# Patient Record
Sex: Female | Born: 1955 | Race: Black or African American | Hispanic: No | Marital: Married | Smoking: Current every day smoker
Health system: Southern US, Community
[De-identification: ages and names within clinical notes are randomized; demographics above are authoritative.]

## PROBLEM LIST (undated history)

## (undated) DIAGNOSIS — Z87898 Personal history of other specified conditions: Secondary | ICD-10-CM

## (undated) DIAGNOSIS — E0590 Thyrotoxicosis, unspecified without thyrotoxic crisis or storm: Secondary | ICD-10-CM

## (undated) DIAGNOSIS — E119 Type 2 diabetes mellitus without complications: Secondary | ICD-10-CM

## (undated) DIAGNOSIS — F329 Major depressive disorder, single episode, unspecified: Secondary | ICD-10-CM

## (undated) DIAGNOSIS — D649 Anemia, unspecified: Secondary | ICD-10-CM

## (undated) DIAGNOSIS — Z8759 Personal history of other complications of pregnancy, childbirth and the puerperium: Secondary | ICD-10-CM

## (undated) DIAGNOSIS — IMO0001 Reserved for inherently not codable concepts without codable children: Secondary | ICD-10-CM

## (undated) DIAGNOSIS — Z5189 Encounter for other specified aftercare: Secondary | ICD-10-CM

## (undated) DIAGNOSIS — M1990 Unspecified osteoarthritis, unspecified site: Secondary | ICD-10-CM

## (undated) DIAGNOSIS — E7800 Pure hypercholesterolemia, unspecified: Secondary | ICD-10-CM

## (undated) DIAGNOSIS — I10 Essential (primary) hypertension: Secondary | ICD-10-CM

## (undated) DIAGNOSIS — I2699 Other pulmonary embolism without acute cor pulmonale: Secondary | ICD-10-CM

## (undated) HISTORY — PX: CERVICAL CERCLAGE: SHX1329

## (undated) HISTORY — PX: BREAST CYST EXCISION: SHX579

## (undated) HISTORY — PX: REDUCTION MAMMAPLASTY: SUR839

## (undated) HISTORY — DX: Essential (primary) hypertension: I10

## (undated) HISTORY — DX: Type 2 diabetes mellitus without complications: E11.9

## (undated) HISTORY — DX: Major depressive disorder, single episode, unspecified: F32.9

## (undated) HISTORY — PX: BREAST REDUCTION SURGERY: SHX8

## (undated) HISTORY — PX: JOINT REPLACEMENT: SHX530

## (undated) HISTORY — DX: Pure hypercholesterolemia, unspecified: E78.00

---

## 1992-09-04 DIAGNOSIS — Z87898 Personal history of other specified conditions: Secondary | ICD-10-CM

## 1992-09-04 HISTORY — DX: Personal history of other specified conditions: Z87.898

## 1997-10-31 ENCOUNTER — Other Ambulatory Visit: Admission: RE | Admit: 1997-10-31 | Discharge: 1997-10-31 | Payer: Self-pay | Admitting: Obstetrics and Gynecology

## 1998-11-04 ENCOUNTER — Other Ambulatory Visit: Admission: RE | Admit: 1998-11-04 | Discharge: 1998-11-04 | Payer: Self-pay | Admitting: Obstetrics and Gynecology

## 1999-01-20 ENCOUNTER — Encounter (INDEPENDENT_AMBULATORY_CARE_PROVIDER_SITE_OTHER): Payer: Self-pay | Admitting: Specialist

## 1999-01-21 ENCOUNTER — Inpatient Hospital Stay (HOSPITAL_COMMUNITY): Admission: AD | Admit: 1999-01-21 | Discharge: 1999-01-23 | Payer: Self-pay | Admitting: Obstetrics and Gynecology

## 1999-11-13 ENCOUNTER — Other Ambulatory Visit: Admission: RE | Admit: 1999-11-13 | Discharge: 1999-11-13 | Payer: Self-pay | Admitting: *Deleted

## 2000-03-03 ENCOUNTER — Encounter: Admission: RE | Admit: 2000-03-03 | Discharge: 2000-03-03 | Payer: Self-pay | Admitting: Family Medicine

## 2000-03-03 ENCOUNTER — Encounter: Payer: Self-pay | Admitting: Family Medicine

## 2001-02-07 ENCOUNTER — Other Ambulatory Visit: Admission: RE | Admit: 2001-02-07 | Discharge: 2001-02-07 | Payer: Self-pay | Admitting: *Deleted

## 2002-02-02 ENCOUNTER — Other Ambulatory Visit: Admission: RE | Admit: 2002-02-02 | Discharge: 2002-02-02 | Payer: Self-pay | Admitting: Family Medicine

## 2002-09-12 ENCOUNTER — Encounter: Payer: Self-pay | Admitting: Plastic Surgery

## 2002-09-12 ENCOUNTER — Ambulatory Visit (HOSPITAL_COMMUNITY): Admission: RE | Admit: 2002-09-12 | Discharge: 2002-09-12 | Payer: Self-pay | Admitting: Plastic Surgery

## 2003-07-11 ENCOUNTER — Other Ambulatory Visit: Admission: RE | Admit: 2003-07-11 | Discharge: 2003-07-11 | Payer: Self-pay | Admitting: Family Medicine

## 2003-08-23 ENCOUNTER — Encounter: Admission: RE | Admit: 2003-08-23 | Discharge: 2003-08-23 | Payer: Self-pay | Admitting: Family Medicine

## 2005-01-27 ENCOUNTER — Other Ambulatory Visit: Admission: RE | Admit: 2005-01-27 | Discharge: 2005-01-27 | Payer: Self-pay | Admitting: Family Medicine

## 2006-07-06 LAB — CONVERTED CEMR LAB

## 2006-07-20 ENCOUNTER — Other Ambulatory Visit: Admission: RE | Admit: 2006-07-20 | Discharge: 2006-07-20 | Payer: Self-pay | Admitting: Family Medicine

## 2006-07-30 ENCOUNTER — Emergency Department (HOSPITAL_COMMUNITY): Admission: EM | Admit: 2006-07-30 | Discharge: 2006-07-31 | Payer: Self-pay | Admitting: Emergency Medicine

## 2008-06-21 ENCOUNTER — Encounter: Admission: RE | Admit: 2008-06-21 | Discharge: 2008-06-21 | Payer: Self-pay | Admitting: Family Medicine

## 2008-07-05 LAB — HM MAMMOGRAPHY

## 2008-07-18 ENCOUNTER — Observation Stay (HOSPITAL_COMMUNITY): Admission: EM | Admit: 2008-07-18 | Discharge: 2008-07-20 | Payer: Self-pay | Admitting: Emergency Medicine

## 2008-08-01 ENCOUNTER — Encounter (INDEPENDENT_AMBULATORY_CARE_PROVIDER_SITE_OTHER): Payer: Self-pay | Admitting: Surgery

## 2008-08-01 ENCOUNTER — Ambulatory Visit (HOSPITAL_BASED_OUTPATIENT_CLINIC_OR_DEPARTMENT_OTHER): Admission: RE | Admit: 2008-08-01 | Discharge: 2008-08-01 | Payer: Self-pay | Admitting: Surgery

## 2008-11-14 ENCOUNTER — Encounter: Payer: Self-pay | Admitting: Endocrinology

## 2009-01-24 ENCOUNTER — Ambulatory Visit: Payer: Self-pay | Admitting: Endocrinology

## 2009-01-24 DIAGNOSIS — I10 Essential (primary) hypertension: Secondary | ICD-10-CM | POA: Insufficient documentation

## 2009-01-24 DIAGNOSIS — R079 Chest pain, unspecified: Secondary | ICD-10-CM | POA: Insufficient documentation

## 2009-01-24 DIAGNOSIS — E119 Type 2 diabetes mellitus without complications: Secondary | ICD-10-CM | POA: Insufficient documentation

## 2009-01-24 DIAGNOSIS — F17200 Nicotine dependence, unspecified, uncomplicated: Secondary | ICD-10-CM | POA: Insufficient documentation

## 2009-01-24 DIAGNOSIS — F3289 Other specified depressive episodes: Secondary | ICD-10-CM

## 2009-01-24 DIAGNOSIS — E7800 Pure hypercholesterolemia, unspecified: Secondary | ICD-10-CM | POA: Insufficient documentation

## 2009-01-24 DIAGNOSIS — F329 Major depressive disorder, single episode, unspecified: Secondary | ICD-10-CM

## 2009-01-24 HISTORY — DX: Other specified depressive episodes: F32.89

## 2009-01-24 HISTORY — DX: Type 2 diabetes mellitus without complications: E11.9

## 2009-01-24 HISTORY — DX: Pure hypercholesterolemia, unspecified: E78.00

## 2009-01-24 HISTORY — DX: Major depressive disorder, single episode, unspecified: F32.9

## 2009-01-24 HISTORY — DX: Essential (primary) hypertension: I10

## 2009-02-04 LAB — CONVERTED CEMR LAB

## 2009-02-06 ENCOUNTER — Ambulatory Visit: Payer: Self-pay | Admitting: Endocrinology

## 2009-02-19 ENCOUNTER — Other Ambulatory Visit: Admission: RE | Admit: 2009-02-19 | Discharge: 2009-02-19 | Payer: Self-pay | Admitting: Family Medicine

## 2009-02-20 ENCOUNTER — Encounter: Payer: Self-pay | Admitting: Endocrinology

## 2009-03-20 ENCOUNTER — Ambulatory Visit: Payer: Self-pay | Admitting: Endocrinology

## 2009-04-25 ENCOUNTER — Telehealth: Payer: Self-pay | Admitting: Endocrinology

## 2009-06-19 ENCOUNTER — Ambulatory Visit: Payer: Self-pay | Admitting: Endocrinology

## 2009-06-19 DIAGNOSIS — R209 Unspecified disturbances of skin sensation: Secondary | ICD-10-CM | POA: Insufficient documentation

## 2009-06-19 DIAGNOSIS — IMO0001 Reserved for inherently not codable concepts without codable children: Secondary | ICD-10-CM | POA: Insufficient documentation

## 2009-06-19 LAB — CONVERTED CEMR LAB
Hgb A1c MFr Bld: 8.5 % — ABNORMAL HIGH (ref 4.6–6.5)
Microalb, Ur: 0.6 mg/dL (ref 0.0–1.9)
Sed Rate: 54 mm/hr — ABNORMAL HIGH (ref 0–22)

## 2009-06-20 ENCOUNTER — Encounter: Payer: Self-pay | Admitting: Endocrinology

## 2009-07-01 ENCOUNTER — Encounter: Payer: Self-pay | Admitting: Endocrinology

## 2009-09-24 ENCOUNTER — Ambulatory Visit: Payer: Self-pay | Admitting: Endocrinology

## 2009-12-24 ENCOUNTER — Ambulatory Visit: Payer: Self-pay | Admitting: Endocrinology

## 2009-12-25 ENCOUNTER — Telehealth: Payer: Self-pay | Admitting: Endocrinology

## 2010-03-18 ENCOUNTER — Ambulatory Visit: Payer: Self-pay | Admitting: Endocrinology

## 2010-03-18 LAB — CONVERTED CEMR LAB
ALT: 17 units/L (ref 0–35)
Bilirubin, Direct: 0 mg/dL (ref 0.0–0.3)
Cholesterol: 164 mg/dL (ref 0–200)
HDL: 62.3 mg/dL (ref >39.00)
Hgb A1c MFr Bld: 7.2 % — ABNORMAL HIGH (ref 4.6–6.5)
Total Protein: 7.3 g/dL (ref 6.0–8.3)
VLDL: 25.4 mg/dL (ref 0.0–40.0)

## 2010-05-05 ENCOUNTER — Telehealth: Payer: Self-pay | Admitting: Endocrinology

## 2010-05-06 NOTE — Assessment & Plan Note (Signed)
Summary: 3 MTH FU   STC   Vital Signs:  Patient profile:   55 year old female Height:      66 inches (167.64 cm) Weight:      196.50 pounds (89.32 kg) O2 Sat:      98 % on Room air Temp:     98.6 degrees F (37.00 degrees C) oral Pulse rate:   83 / minute BP sitting:   118 / 82  (left arm) Cuff size:   large  Vitals Entered By: Josph Macho RMA (June 19, 2009 9:47 AM)  O2 Flow:  Room air CC: 3 month follow up/ pt states she is no longer taking Simvastatin/ CF Is Patient Diabetic? Yes   Referring Provider:  griffin  CC:  3 month follow up/ pt states she is no longer taking Simvastatin/ CF.  History of Present Illness: no cbg record, but states cbg's vary from 70-140.   she takes amaryl 1/2 of 2 mg each am. pt states few months of moderate myalgias of the legs, and associated numbness.  she has sxs to a lesser extent on the hands.  Current Medications (verified): 1)  Simvastatin 40 Mg Tabs (Simvastatin) 2)  Victoza 18 Mg/55ml Soln (Liraglutide) .... 1.8 Mg Qd 3)  Bd Pen Needle Short U/f 31g X 8 Mm Misc (Insulin Pen Needle) .Marland Kitchen.. 1 Qd 4)  Benicar 5 Mg Tabs (Olmesartan Medoxomil) .Marland Kitchen.. 1 Qd 5)  Glimepiride 2 Mg Tabs (Glimepiride) .Marland Kitchen.. 1 Qd 6)  Xanax 0.25mg  .... 1 As Needed  Allergies (verified): 1)  ! Codeine Sulfate (Codeine Sulfate) 2)  ! Metformin Hcl  Past History:  Past Medical History: Last updated: 01/24/2009 DEPRESSION (ICD-311) HYPERCHOLESTEROLEMIA (ICD-272.0) HYPERTENSION (ICD-401.9) DM (ICD-250.00)  Review of Systems  The patient denies syncope, weight loss, and weight gain.    Physical Exam  General:  normal appearance.   Pulses:  dorsalis pedis intact bilat.   Extremities:  no deformity.  no ulcer on the feet.  feet are of normal color and temp.  no edema  Neurologic:  sensation is intact to touch on the feet  Additional Exam:  Creatine Kinase           127 U/L                     7-177 Sed Rate             [H]  54 mm/hr   Hemoglobin A1C        [H]  8.5 %        Impression & Recommendations:  Problem # 1:  DM (ICD-250.00) needs increased rx  Problem # 2:  NUMBNESS (ICD-782.0) ? related to #1  Problem # 3:  MYALGIA (ICD-729.1) uncertain etiology  Medications Added to Medication List This Visit: 1)  Xanax 0.25mg   .... 1 as needed 2)  Glimepiride 4 Mg Tabs (Glimepiride) .Marland Kitchen.. 1 each am  Other Orders: Nephrology Referral (Nephro) TLB-A1C / Hgb A1C (Glycohemoglobin) (83036-A1C) TLB-Microalbumin/Creat Ratio, Urine (82043-MALB) TLB-CK Total Only(Creatine Kinase/CPK) (82550-CK) TLB-Sedimentation Rate (ESR) (85652-ESR) Est. Patient Level IV ([address])  Patient Instructions: 1)  check your blood sugar 1 time a day.  vary the time of day when you check, between before the 3 meals, and at bedtime.  also check if you have symptoms of your blood sugar being too high or too low.  please keep a record of the readings and bring it to your next appointment here.  please call us sooner if  you are having low blood sugar episodes. 2)  tests are being ordered for you today.  a few days after the test(s), please call [phone] to hear your test results. 3)  pending the test results, please: 4)  continue victoza 1.8 mg each am. 5)  continue amaryl to 1/2 of 2 mg each am. 6)  Please schedule a follow-up appointment in 3 months. 7)  we'll check nerve and muscle tests of the arms and legs.  you will be called with a day and time for an appointment. 8)  (update: i left message on phone-tree:  increase amaryl to 4 mg each am.  call for cbg below 70). Prescriptions: GLIMEPIRIDE 4 MG TABS (GLIMEPIRIDE) 1 each am  #30 x 11   Entered and Authorized by:   Minus Breeding MD   Signed by:   Minus Breeding MD on 06/19/2009   Method used:   [facility]  [address] [phone][facility] (retail)       [address]       [place], [place]  [address]       Ph: [phone]       Fax: [phone]   RxID:   [id number]

## 2010-05-06 NOTE — Progress Notes (Signed)
Summary: Actos?  Phone Note Call from Patient   Summary of Call: Pt called stating that she is interested in starting Actos to control blood sugar but she does have some question: Is this medciation going to replace on of her cureent meds or is it in addition to? Is ther another alternative medication, possibly Onglyza? Please advise Initial call taken by: Margaret Pyle, CMA,  December 25, 2009 4:18 PM  Follow-up for Phone Call        because you are already on victoza, the addition of onglyza would not help.  i sent rx for actos to pharmacy Follow-up by: Minus Breeding MD,  December 25, 2009 5:36 PM  Additional Follow-up for Phone Call Additional follow up Details #1::        Pt informed Additional Follow-up by: Margaret Pyle, CMA,  December 26, 2009 7:59 AM

## 2010-05-06 NOTE — Progress Notes (Signed)
Summary: Rx request  Phone Note Call from Patient   Caller: Patient [phone] Summary of Call: pt called requesting Rx Victoza #30 x 2 until ROV in March. Rx sent to pt preferred pharmacy Initial call taken by: Margaret Pyle, CMA,  April 25, 2009 3:36 PM    Prescriptions: VICTOZA 18 MG/3ML SOLN (LIRAGLUTIDE) 1.8 mg qd  #30 x 2   Entered by:   Margaret Pyle, CMA   Authorized by:   Minus Breeding MD   Signed by:   Margaret Pyle, CMA on 04/25/2009   Method used:   [facility]  [address] [phone]* (retail)       [address]       [place], [place]  [address]       Ph: [phone]       Fax: [phone]   RxID:   [id number]

## 2010-05-06 NOTE — Miscellaneous (Signed)
Summary: Orders Update  Clinical Lists Changes  Orders: Added new Referral order of Neurology Referral (Neuro) - Signed 

## 2010-05-06 NOTE — Assessment & Plan Note (Signed)
Summary: 3 MTH FU  STC   Vital Signs:  Patient profile:   55 year old female Height:      66 inches (167.64 cm) Weight:      194 pounds (88.18 kg) BMI:     31.43 O2 Sat:      99 % on Room air Temp:     98.3 degrees F (36.83 degrees C) oral Pulse rate:   83 / minute Pulse rhythm:   regular BP sitting:   110 / 84  (left arm) Cuff size:   large  Vitals Entered By: Brenton Grills (September 24, 2009 9:20 AM)  O2 Flow:  Room air CC: 18mo f/u/aj   Referring Provider:  griffin  CC:  18mo f/u/aj.  History of Present Illness: no cbg record, but states cbg's are "140 average."  pt states she feels well in general.  Current Medications (verified): 1)  Simvastatin 40 Mg Tabs (Simvastatin) 2)  Victoza 18 Mg/74ml Soln (Liraglutide) .... 1.8 Mg Qd 3)  Bd Pen Needle Short U/f 31g X 8 Mm Misc (Insulin Pen Needle) .Marland Kitchen.. 1 Qd 4)  Benicar 5 Mg Tabs (Olmesartan Medoxomil) .Marland Kitchen.. 1 Qd 5)  Xanax 0.25mg  .... 1 As Needed 6)  Glimepiride 4 Mg Tabs (Glimepiride) .Marland Kitchen.. 1 Each Am  Allergies (verified): 1)  ! Codeine Sulfate (Codeine Sulfate) 2)  ! Metformin Hcl  Past History:  Past Medical History: Last updated: 01/24/2009 DEPRESSION (ICD-311) HYPERCHOLESTEROLEMIA (ICD-272.0) HYPERTENSION (ICD-401.9) DM (ICD-250.00)  Review of Systems  The patient denies hypoglycemia.    Physical Exam  Neck:  Supple without thyroid enlargement or tenderness.  Pulses:  dorsalis pedis intact bilat.   Extremities:  no deformity.  no ulcer on the feet.  feet are of normal color and temp.  no edema  Neurologic:  sensation is intact to touch on the feet  Additional Exam:  Hemoglobin A1C       [H]  8.8 %      Impression & Recommendations:  Problem # 1:  DM (ICD-250.00) needs increased rx  Medications Added to Medication List This Visit: 1)  Bromocriptine Mesylate 2.5 Mg Tabs (Bromocriptine mesylate) .... 1/2 tab qhs  Other Orders: TLB-A1C / Hgb A1C (Glycohemoglobin) (83036-A1C)  Patient  Instructions: 1)  check your blood sugar 1 time a day.  vary the time of day when you check, between before the 3 meals, and at bedtime.  also check if you have symptoms of your blood sugar being too high or too low.  please keep a record of the readings and bring it to your next appointment here.  please call us sooner if you are having low blood sugar episodes. 2)  tests are being ordered for you today.  a few days after the test(s), please call [phone] to hear your test results. 3)  pending the test results, please: 4)  continue victoza 1.8 mg each am. 5)  Please schedule a follow-up appointment in 3 months. 6)  continue amaryl to 4 mg each am.  7)  if a1c is high, options are to add actos or bromocriptine. 8)  (update: i left message on phone-tree:  add bromocriptine 1/2 of 2.5 mg at bedtime) Prescriptions: BROMOCRIPTINE MESYLATE 2.5 MG TABS (BROMOCRIPTINE MESYLATE) 1/2 tab qhs  #30 x 5   Entered and Authorized by:   Minus Breeding MD   Signed by:   Minus Breeding MD on 09/24/2009   Method used:   [facility]  [address] [id number]       [address]       [place], [place]  [address]       Ph: [phone]       Fax: [phone]   RxID:   [id number]

## 2010-05-06 NOTE — Assessment & Plan Note (Signed)
Summary: 3 MTH FU  STC   Vital Signs:  Patient profile:   55 year old female Height:      66 inches (167.64 cm) Weight:      193 pounds (87.73 kg) O2 Sat:      99 % on Room air Temp:     98.6 degrees F (37.00 degrees C) oral Pulse rate:   83 / minute Pulse rhythm:   regular BP sitting:   122 / 78  (left arm) Cuff size:   large  Vitals Entered By: Brenton Grills MA (December 24, 2009 9:07 AM)  O2 Flow:  Room air CC: 3 month F/U/aj Is Patient Diabetic? Yes   Referring Provider:  griffin  CC:  3 month F/U/aj.  History of Present Illness: no cbg record, but states cbg's are mid-100's.  she had left knee surgery, but now feels better.  she has lost weight, due to her efforts.  Current Medications (verified): 1)  Simvastatin 40 Mg Tabs (Simvastatin) 2)  Victoza 18 Mg/69ml Soln (Liraglutide) .... 1.8 Mg Qd 3)  Bd Pen Needle Short U/f 31g X 8 Mm Misc (Insulin Pen Needle) .Marland Kitchen.. 1 Qd 4)  Benicar 5 Mg Tabs (Olmesartan Medoxomil) .Marland Kitchen.. 1 Qd 5)  Xanax 0.25mg  .... 1 As Needed 6)  Glimepiride 4 Mg Tabs (Glimepiride) .Marland Kitchen.. 1 Each Am 7)  Bromocriptine Mesylate 2.5 Mg Tabs (Bromocriptine Mesylate) .... 1/2 Tab Qhs  Allergies (verified): 1)  ! Codeine Sulfate (Codeine Sulfate) 2)  ! Metformin Hcl  Past History:  Past Medical History: Last updated: 01/24/2009 DEPRESSION (ICD-311) HYPERCHOLESTEROLEMIA (ICD-272.0) HYPERTENSION (ICD-401.9) DM (ICD-250.00)  Review of Systems  The patient denies hypoglycemia.    Physical Exam  General:  normal appearance.   Neck:  Supple without thyroid enlargement or tenderness.  Additional Exam:  Hemoglobin A1C       [H]  8.9 %       Impression & Recommendations:  Problem # 1:  DM (ICD-250.00) needs increased rx  Medications Added to Medication List This Visit: 1)  Actos 45 Mg Tabs (Pioglitazone hcl) .Marland Kitchen.. 1 tab once daily  Other Orders: TLB-A1C / Hgb A1C (Glycohemoglobin) (83036-A1C) Est. Patient Level III ([address])  Patient  Instructions: 1)  check your blood sugar 1 time a day.  vary the time of day when you check, between before the 3 meals, and at bedtime.  also check if you have symptoms of your blood sugar being too high or too low.  please keep a record of the readings and bring it to your next appointment here.  please call us sooner if you are having low blood sugar episodes. 2)  tests are being ordered for you today.  a few days after the test(s), please call [phone] to hear your test results. 3)  pending the test results, please continue the same medications for now. 4)  Please schedule a follow-up appointment in 3 months. 5)  here are a few clarinex pills 5 mg, for allergy symptoms 6)  (update: i left message on phone-tree:  a1c is still high.  please consider actos, and call if you agree.) Prescriptions: ACTOS 45 MG TABS (PIOGLITAZONE HCL) 1 tab once daily  #30 x 11   Entered and Authorized by:   Minus Breeding MD   Signed by:   Minus Breeding MD on 12/25/2009   Method used:   [facility]        [facility]  [address] [phone]* (retail)  [address]       [place], [place]  [address]       Ph: [phone]       Fax: [phone]   RxID:   [id number]

## 2010-05-08 NOTE — Assessment & Plan Note (Signed)
Summary: 3 MTH FU--STC   Vital Signs:  Patient profile:   55 year old female Height:      66 inches (167.64 cm) Weight:      202.50 pounds (92.05 kg) BMI:     32.80 O2 Sat:      99 % on Room air Temp:     98.7 degrees F (37.06 degrees C) oral Pulse rate:   81 / minute Pulse rhythm:   regular BP sitting:   112 / 76  (left arm) Cuff size:   large  Vitals Entered By: Brenton Grills CMA Duncan Dull) (March 18, 2010 9:14 AM)  O2 Flow:  Room air CC: 3 month F/U/aj Is Patient Diabetic? Yes Comments Per pt, pt is due for mammogram, pap, and colonoscopy is due   Referring Provider:  griffin  CC:  3 month F/U/aj.  History of Present Illness: no cbg record, but states cbg's are well-controlled, until she got a dental minfection last week.  then, cbg increased to mid-100's.  prior to the infection, it was low-100's.  she ran out of amaryl 4 days ago, as her pills were only 2 mg.  she c/o weight gain.   pt wants to re-try the chantix  Current Medications (verified): 1)  Simvastatin 40 Mg Tabs (Simvastatin) 2)  Victoza 18 Mg/48ml Soln (Liraglutide) .... 1.8 Mg Qd 3)  Bd Pen Needle Short U/f 31g X 8 Mm Misc (Insulin Pen Needle) .Marland Kitchen.. 1 Qd 4)  Benicar 5 Mg Tabs (Olmesartan Medoxomil) .Marland Kitchen.. 1 Qd 5)  Xanax 0.25mg  .... 1 As Needed 6)  Glimepiride 4 Mg Tabs (Glimepiride) .Marland Kitchen.. 1 Each Am 7)  Bromocriptine Mesylate 2.5 Mg Tabs (Bromocriptine Mesylate) .... 1/2 Tab Qhs 8)  Actos 45 Mg Tabs (Pioglitazone Hcl) .Marland Kitchen.. 1 Tab Once Daily  Allergies (verified): 1)  ! Codeine Sulfate (Codeine Sulfate) 2)  ! Metformin Hcl  Past History:  Past Medical History: Last updated: 01/24/2009 DEPRESSION (ICD-311) HYPERCHOLESTEROLEMIA (ICD-272.0) HYPERTENSION (ICD-401.9) DM (ICD-250.00)  Review of Systems  The patient denies hypoglycemia.    Physical Exam  General:  normal appearance.   Pulses:  dorsalis pedis intact bilat.   Extremities:  no deformity.  no ulcer on the feet.  feet are of normal color  and temp.  no edema  Neurologic:  sensation is intact to touch on the feet.  Additional Exam:  Hemoglobin A1C       [H]  7.2 %   Impression & Recommendations:  Problem # 1:  DM (ICD-250.00) goal is to minimize the sulfonylurea.  Medications Added to Medication List This Visit: 1)  Bromocriptine Mesylate 2.5 Mg Tabs (Bromocriptine mesylate) .... 1/2 tab at bedtime 2)  Bromocriptine Mesylate 2.5 Mg Tabs (Bromocriptine mesylate) .Marland Kitchen.. 1 tab at bedtime 3)  Chantix Starting Month Pak 0.5 Mg X 11 & 1 Mg X 42 Tabs (Varenicline tartrate) .Marland Kitchen.. 1 tab two times a day.  please refill with continuing packs  Other Orders: Diabetic Clinic Referral (Diabetic) TLB-A1C / Hgb A1C (Glycohemoglobin) (83036-A1C) TLB-Lipid Panel (80061-LIPID) TLB-Hepatic/Liver Function Pnl (80076-HEPATIC) Est. Patient Level III ([address])  Patient Instructions: 1)  blood tests are being ordered for you today.  please call [phone] to hear your test results. 2)  pending the test results, please increase bromocriptine to 2.5 mg at bedtime, and take glimepiride 4 mg each am.  3)  Please schedule a follow-up appointment in 3 months. 4)  please make an appointment with dr Valentina Lucks. 5)  (update: i sent rx for chantix) Prescriptions: CHANTIX  STARTING MONTH PAK 0.5 MG X 11 & 1 MG X 42 TABS (VARENICLINE TARTRATE) 1 tab two times a day.  please refill with continuing packs  #1 month x 5   Entered and Authorized by:   Minus Breeding MD   Signed by:   Minus Breeding MD on 03/18/2010   Method used:   [facility]        [facility]  [address] [phone][facility] ([facility]ail)       [address]       [place], [place]  [address]       Ph: [phone]       Fax: [phone]   RxID:   [id number] BROMOCRIPTINE MESYLATE 2.5 MG TABS (BROMOCRIPTINE MESYLATE) 1 tab at bedtime  #30 x 11   Entered and Authorized by:   Minus Breeding MD   Signed by:   Minus Breeding MD on 03/18/2010   Method used:   [facility]ron[facility] to         [facility]  [address] [phone]* (retail)       [address]       [place], [place]  [address]       Ph: [phone]       Fax: [phone]   RxID:   [id number] GLIMEPIRIDE 4 MG TABS (GLIMEPIRIDE) 1 each am  #30 x 11   Entered and Authorized by:   Minus Breeding MD   Signed by:   Minus Breeding MD on 03/18/2010   Method used:   [facility]        [facility]  [address] [phone]* (retail)       [address]       [place], [place]  [address]       Ph: [phone]       Fax: [phone]   RxID:   [id number] VICTOZA 18 MG/3ML SOLN (LIRAGLUTIDE) 1.8 mg qd  #30 days x 11   Entered and Authorized by:   Minus Breeding MD   Signed by:   Minus Breeding MD on 03/18/2010   Method used:   [facility] to        [facility]  [address] [phone]* (retail)       [address]       [place], [place]  [address]       Ph: [phone]       Fax: [phone]   RxID:   [id number]    Orders Added: 1)  Diabetic Clinic Referral [Diabetic] 2)  TLB-A1C / Hgb A1C (Glycohemoglobin) [83036-A1C] 3)  TLB-Lipid Panel [80061-LIPID] 4)  TLB-Hepatic/Liver Function Pnl [80076-HEPATIC] 5)  Est. Patient Level III [[address]]   Immunization History:  Tetanus/Td Immunization History:    Tetanus/Td:  historical (04/07/2003)  Influenza Immunization History:    Influenza:  historical (01/04/2010)   Immunization History:  Tetanus/Td Immunization History:    Tetanus/Td:  Historical (04/07/2003)  Influenza Immunization History:    Influenza:  Historical (01/04/2010)

## 2010-05-14 NOTE — Progress Notes (Signed)
Summary: low CBG  Phone Note Call from Patient Call back at Work Phone [phone]   Caller: Patient [phone] Summary of Call: Pt called stating she had low CBG last night, 48. Pt says she was told that she needed to inform MD if this happened. Initial call taken by: Margaret Pyle, CMA,  May 05, 2010 11:13 AM  Follow-up for Phone Call        reduce glimepiride to 1 mg each am.  i sent rx.  ret as scheduled. Follow-up by: Minus Breeding MD,  May 05, 2010 11:16 AM  Additional Follow-up for Phone Call Additional follow up Details #1::        Pt informed via VM, told to call back if needed Additional Follow-up by: Margaret Pyle, CMA,  May 05, 2010 1:28 PM    New/Updated Medications: GLIMEPIRIDE 1 MG TABS (GLIMEPIRIDE) 1 tab each am Prescriptions: GLIMEPIRIDE 1 MG TABS (GLIMEPIRIDE) 1 tab each am  #30 x 3   Entered and Authorized by:   Minus Breeding MD   Signed by:   Minus Breeding MD on 05/05/2010   Method used:   [facility]        [facility]  [address] [phone]* (retail)       [address]       [place], [place]  [address]       Ph: [phone]       Fax: [phone]   RxID:   [id number]

## 2010-06-18 ENCOUNTER — Ambulatory Visit: Payer: Self-pay | Admitting: Endocrinology

## 2010-07-08 ENCOUNTER — Other Ambulatory Visit: Payer: Self-pay | Admitting: Family Medicine

## 2010-07-08 ENCOUNTER — Other Ambulatory Visit: Payer: Self-pay | Admitting: Endocrinology

## 2010-07-08 DIAGNOSIS — Z1231 Encounter for screening mammogram for malignant neoplasm of breast: Secondary | ICD-10-CM

## 2010-07-16 LAB — DIFFERENTIAL
Basophils Absolute: 0.1 10*3/uL (ref 0.0–0.1)
Basophils Absolute: 0 10*3/uL (ref 0.0–0.1)
Lymphocytes Relative: 31 % (ref 12–46)
Lymphocytes Relative: 31 % (ref 12–46)
Monocytes Absolute: 0.6 10*3/uL (ref 0.1–1.0)
Monocytes Absolute: 0.5 10*3/uL (ref 0.1–1.0)
Monocytes Relative: 6 % (ref 3–12)
Neutro Abs: 7.3 10*3/uL (ref 1.7–7.7)
Neutro Abs: 5.7 10*3/uL (ref 1.7–7.7)
Neutrophils Relative %: 62 % (ref 43–77)
Neutrophils Relative %: 61 % (ref 43–77)

## 2010-07-16 LAB — CARDIAC PANEL(CRET KIN+CKTOT+MB+TROPI)
CK, MB: 1.3 ng/mL (ref 0.3–4.0)
CK, MB: 1.3 ng/mL (ref 0.3–4.0)
CK, MB: 1.6 ng/mL (ref 0.3–4.0)
Relative Index: 1.1 (ref 0.0–2.5)
Relative Index: 1.1 (ref 0.0–2.5)
Relative Index: 1.3 (ref 0.0–2.5)
Total CK: 119 U/L (ref 7–177)
Total CK: 121 U/L (ref 7–177)
Troponin I: <0.01 ng/mL (ref 0.00–0.06)

## 2010-07-16 LAB — TROPONIN I: Troponin I: <0.01 ng/mL (ref 0.00–0.06)

## 2010-07-16 LAB — GLUCOSE, CAPILLARY
Glucose-Capillary: 116 mg/dL — ABNORMAL HIGH (ref 70–99)
Glucose-Capillary: 128 mg/dL — ABNORMAL HIGH (ref 70–99)
Glucose-Capillary: 118 mg/dL — ABNORMAL HIGH (ref 70–99)
Glucose-Capillary: 126 mg/dL — ABNORMAL HIGH (ref 70–99)
Glucose-Capillary: 130 mg/dL — ABNORMAL HIGH (ref 70–99)

## 2010-07-16 LAB — HEMOGLOBIN A1C: Hgb A1c MFr Bld: 7.3 % — ABNORMAL HIGH (ref 4.6–6.1)

## 2010-07-16 LAB — CBC
HCT: 37.2 % (ref 36.0–46.0)
Hemoglobin: 13.4 g/dL (ref 12.0–15.0)
Hemoglobin: 11.4 g/dL — ABNORMAL LOW (ref 12.0–15.0)
Hemoglobin: 11.7 g/dL — ABNORMAL LOW (ref 12.0–15.0)
MCV: 86.4 fL (ref 78.0–100.0)
Platelets: 361 10*3/uL (ref 150–400)
RBC: 3.74 MIL/uL — ABNORMAL LOW (ref 3.87–5.11)
RBC: 3.89 MIL/uL (ref 3.87–5.11)
RBC: 4.31 MIL/uL (ref 3.87–5.11)
RDW: 14.9 % (ref 11.5–15.5)
RDW: 14.4 % (ref 11.5–15.5)
WBC: 9.3 10*3/uL (ref 4.0–10.5)
WBC: 12.9 10*3/uL — ABNORMAL HIGH (ref 4.0–10.5)

## 2010-07-16 LAB — APTT: aPTT: 31 seconds (ref 24–37)

## 2010-07-16 LAB — POCT CARDIAC MARKERS
Myoglobin, poc: 64.3 ng/mL (ref 12–200)
Troponin i, poc: <0.05 ng/mL (ref 0.00–0.09)

## 2010-07-16 LAB — COMPREHENSIVE METABOLIC PANEL
ALT: 31 U/L (ref 0–35)
AST: 33 U/L (ref 0–37)
Alkaline Phosphatase: 81 U/L (ref 39–117)
CO2: 25 mEq/L (ref 19–32)
Glucose, Bld: 124 mg/dL — ABNORMAL HIGH (ref 70–99)
Potassium: 3.6 mEq/L (ref 3.5–5.1)
Sodium: 142 mEq/L (ref 135–145)
Total Protein: 6.2 g/dL (ref 6.0–8.3)

## 2010-07-16 LAB — POCT I-STAT, CHEM 8
BUN: 21 mg/dL (ref 6–23)
Chloride: 108 mEq/L (ref 96–112)
Creatinine, Ser: 0.8 mg/dL (ref 0.4–1.2)
Sodium: 140 mEq/L (ref 135–145)
TCO2: 25 mmol/L (ref 0–100)

## 2010-07-16 LAB — URINALYSIS, ROUTINE W REFLEX MICROSCOPIC
Bilirubin Urine: NEGATIVE
Ketones, ur: NEGATIVE mg/dL
Nitrite: NEGATIVE
Protein, ur: NEGATIVE mg/dL
Urobilinogen, UA: 0.2 mg/dL (ref 0.0–1.0)
pH: 5.5 (ref 5.0–8.0)

## 2010-07-16 LAB — LIPID PANEL
Cholesterol: 183 mg/dL (ref 0–200)
LDL Cholesterol: 87 mg/dL (ref 0–99)
Triglycerides: 249 mg/dL — ABNORMAL HIGH (ref <150)
VLDL: 50 mg/dL — ABNORMAL HIGH (ref 0–40)

## 2010-07-16 LAB — BASIC METABOLIC PANEL
Calcium: 9.5 mg/dL (ref 8.4–10.5)
Calcium: 8.5 mg/dL (ref 8.4–10.5)
Creatinine, Ser: 0.98 mg/dL (ref 0.4–1.2)
GFR calc Af Amer: >60 mL/min (ref >60)
GFR calc non Af Amer: 59 mL/min — ABNORMAL LOW (ref >60)
GFR calc non Af Amer: >60 mL/min (ref >60)
Glucose, Bld: 149 mg/dL — ABNORMAL HIGH (ref 70–99)
Sodium: 138 mEq/L (ref 135–145)
Sodium: 142 mEq/L (ref 135–145)

## 2010-07-16 LAB — CK TOTAL AND CKMB (NOT AT ARMC)
CK, MB: 1.8 ng/mL (ref 0.3–4.0)
Total CK: 162 U/L (ref 7–177)

## 2010-07-29 ENCOUNTER — Ambulatory Visit
Admission: RE | Admit: 2010-07-29 | Discharge: 2010-07-29 | Disposition: A | Discharge status: Home / Self Care | Payer: 59 | Source: Ambulatory Visit | Attending: Family Medicine | Admitting: Family Medicine

## 2010-07-29 DIAGNOSIS — Z1231 Encounter for screening mammogram for malignant neoplasm of breast: Secondary | ICD-10-CM

## 2010-07-31 ENCOUNTER — Other Ambulatory Visit (HOSPITAL_COMMUNITY)
Admission: RE | Admit: 2010-07-31 | Discharge: 2010-07-31 | Disposition: A | Discharge status: Home / Self Care | Payer: 59 | Source: Ambulatory Visit | Attending: Family Medicine | Admitting: Family Medicine

## 2010-07-31 ENCOUNTER — Other Ambulatory Visit: Payer: Self-pay | Admitting: Family Medicine

## 2010-07-31 DIAGNOSIS — Z124 Encounter for screening for malignant neoplasm of cervix: Secondary | ICD-10-CM | POA: Insufficient documentation

## 2010-08-15 ENCOUNTER — Other Ambulatory Visit: Payer: Self-pay | Admitting: Gastroenterology

## 2010-08-19 ENCOUNTER — Encounter: Payer: Self-pay | Admitting: Endocrinology

## 2010-08-19 NOTE — Op Note (Signed)
NAME:  Chelsea Gray, Chelsea Gray NO.:  [id number]   MEDICAL RECORD NO.:  [id number]          PATIENT TYPE:  AMB   LOCATION:  [facility]                         FACILITY:  [facility]   PHYSICIAN:  Thomas A. Cornett, M.D.DATE OF BIRTH:  1956/02/14   DATE OF PROCEDURE:  08/01/2008  DATE OF DISCHARGE:                               OPERATIVE REPORT   PREOPERATIVE DIAGNOSIS:  Right breast mass.   POSTOPERATIVE DIAGNOSIS:  Right breast mass.   PROCEDURE:  Open right excisional breast biopsy.   SURGEON:  Maisie Fus A. Cornett, M.D.   ANESTHESIA:  MAC with 0.25% Sensorcaine with epinephrine local.   ESTIMATED BLOOD LOSS:  5 mL.   SPECIMEN:  Right breast mass to pathology.   INDICATIONS FOR PROCEDURE:  The patient is a 55 year old female with a  right breast mass.  This is in a previous breast reduction scar and is  getting larger.  It is located in the medial aspect of her right breast  and it has had a dark color to it.  I recommended excision for further  diagnosis and for relief of her symptoms.   DESCRIPTION OF PROCEDURE:  The patient was brought to the operating  room.  She was marked preoperatively with the right breast as being  indicated as the proper breast.  Lesion was located on the medial aspect  of a previous breast reduction scar, measured about 1-2 of maximal  diameter.  It was quite superficial.  After induction of anesthesia, she  was prepped and draped in a sterile fashion.  Incision was made directly  over the mass measuring about 2 cm.  The mass came out easily and  appeared to be a cystic mass consistent with a cyst.  The wound was  closed with a deep layer of 3-0 Vicryl and subsequently 4-0 Monocryl  subcuticular stitch.  Dermabond was applied.  All final counts of  sponge, needle and instruments were found to be correct.  She was  awakened and taken to recovery in satisfactory condition.      Thomas A. Cornett, M.D.  Electronically Signed    TAC/MEDQ  D:   08/01/2008  T:  08/01/2008  Job:  [id number]   cc:   Beckie Salts, M.D.   Gretta Arab Valentina Lucks, M.D.  Fax: [phone]

## 2010-08-19 NOTE — Discharge Summary (Signed)
NAMEMarland Kitchen  Chelsea, Gray NO.:  [id number]   MEDICAL RECORD NO.:  [id number]          PATIENT TYPE:  OBV   LOCATION:  5504                         FACILITY:  [facility]   PHYSICIAN:  Ramiro Harvest, MD    DATE OF BIRTH:  Dec 06, 1955   DATE OF ADMISSION:  07/18/2008  DATE OF DISCHARGE:  07/20/2008                               DISCHARGE SUMMARY   PRIMARY CARE PHYSICIAN:  Dr. Maurice Small of [facility].   ADDITIONAL DISCHARGE MEDICATIONS:  Ultram 50 mg p.o. t.i.d. times 5  days.  This is an additional medication that the patient will be  discharged on as well as the discharge medications on prior discharge  summary.      Ramiro Harvest, MD  Electronically Signed     DT/MEDQ  D:  07/20/2008  T:  07/20/2008  Job:  [id number]   cc:   Gretta Arab. Valentina Lucks, M.D.

## 2010-08-19 NOTE — Discharge Summary (Signed)
NAMEMarland Kitchen  Chelsea, Gray NO.:  [id number]   MEDICAL RECORD NO.:  [id number]          PATIENT TYPE:  OBV   LOCATION:  5504                         FACILITY:  [facility]   PHYSICIAN:  Ramiro Harvest, MD    DATE OF BIRTH:  October 15, 1955   DATE OF ADMISSION:  07/18/2008  DATE OF DISCHARGE:  07/20/2008                               DISCHARGE SUMMARY   PRIMARY CARE PHYSICIAN:  Dr. Maurice Small of [facility].   DISCHARGE DIAGNOSIS:  1. Chest pain, likely musculoskeletal in nature.  2. Reactive leukocytosis, resolved.  3. Type 2 diabetes, hemoglobin A1c during this hospitalization was      7.3.  4. Hypertension.  5. Hyperlipidemia.  6. Depression.   DISCHARGE MEDICATIONS:  1. Benicar/HCTZ 40/12.5 mg p.o. daily  2. Zocor 40 mg p.o. daily  3. Amaryl 4 mg p.o. daily  4. Zoloft 100 mg p.o. daily   DISPOSITION AND FOLLOWUP:  The patient will be discharged home.  The  patient is to follow up with her PCP in 1 week as previously scheduled.  On followup, the patient will need a basic metabolic profile done to  follow up on electrolytes and renal function.  The patient's blood  pressure will need to be reassessed, as well as the patient's  musculoskeletal chest pain.  The patient did during the hospitalization  have a hemoglobin A1c done which at the time was 7.3.  The patient had a  fasting lipid panel done with a total cholesterol of 183, triglycerides  of 249, HDL of 46, LDL of 87.  May consider dietary modifications as  outpatient for elevated triglyceride levels versus starting the patient  on Tricor.  Will defer to PCP.   CONSULTATIONS DONE:  None.   PROCEDURE PERFORMED:  A chest x-ray was done on July 18, 2008 that  showed mild chronic bronchitic changes.   ADMISSION HISTORY AND PHYSICAL:  Ms. Chelsea Gray is a pleasant 55-  year-old [place] American female with history of diabetes, hypertension,  and hyperlipidemia who presented with a 4-day history of  chest pain  which was intermittent in nature.  The patient feels a squeezing-like  sensation which was not worse with exertion.  The patient denied any  diarrhea, no nausea, no constipation, no fever, no chills, no upper  respiratory symptoms.  The patient's pain was slightly worse with deep  inspiration and non-radiational.  The patient denied any lower extremity  swelling.  No recent long car trips.  Otherwise review of systems was  completely unremarkable.   PHYSICAL EXAMINATION:  Per admitting physician, vital Signs: Temperature  97.9, blood pressure 146/81, pulse of 70, respirations 20, satting 100%  on room air.  GENERAL: The patient in no acute distress.  HEENT: Normocephalic, atraumatic.  Pupils equal, round and reactive to  light and accommodation.  Extraocular movements intact.  Moist mucous  membranes.  RESPIRATORY:  Lungs are clear to auscultation bilaterally.  CARDIOVASCULAR:  Regular rate and rhythm.  No murmurs, rubs or gallops.  Chest wall was very tender to palpation.  The patient stated that the  pain was in the  same spot as a where chest pain was, seems to be in the  sternal and rib junction.  ABDOMEN:  Obese, nontender and nondistended.  EXTREMITIES:  No clubbing, cyanosis or edema.  NEUROLOGICAL:  The patient was neurologically intact.   LABORATORY DATA:  Admission labs:  CBC:  White count 12.9, hemoglobin  13.3.  Sodium 140, potassium 3.9, creatinine 0.8.  Cardiac markers were  negative.  D-dimer was negative and unremarkable.  EKG showed some T-  wave flattening in lead aVF, V4-V6, otherwise no acute evidence of  ischemia.  Chest x-ray showed mild chronic bronchitic changes but was  otherwise unremarkable.   HOSPITAL COURSE:  1. Chest pain.  The patient was admitted to the chest pain.  It was      felt the patient's chest pain was likely secondary to      costochondritis secondary to musculoskeletal chest pain.  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  The patient's chest wall was      very tender to palpation.  Cardiac enzymes were cycled which came      back negative x3.  A D-dimer was obtained was negative at less than      0.22.  A TSH obtained was within normal limits at 2.285.      Hemoglobin A1c obtained was 7.3.  A chest x-ray was obtained as      stated above.  EKG did not show any ischemic changes per EKG.  The      patient was placed on scheduled Ultram with significant improvement      in her chest pain during the hospitalization.  The patient's chest      pain improved dramatically on Ultram and the patient will be      discharged home on Ultram 50 mg p.o. t.i.d. times 5 days.  The      patient will follow up with her PCP in terms of a chest wall      tenderness.  If the patient does have some recurrent more chest      pain, may consider outpatient referral to a cardiology for further      evaluation as the patient has multiple risk factors including      hypertension, hyperlipidemia, diabetes, as well as a history of      tobacco abuse.  The patient ruled out during the hospitalization      and at that point if she has recurrent chest pain may benefit from      outpatient Cardiolite.  We will defer to PCP on this. The patient      will be discharged in stable and improved condition to follow up      with PCP as stated.  2. Reactive leukocytosis.  On admission, the patient was noted to have      a leukocytosis with a white count of 12.9.  A urinalysis was      obtained which was negative.  A chest x-ray was obtained which was      negative.  The patient remained afebrile and asymptomatic      throughout the hospitalization.  The patient's leukocytosis      resolved and by day of discharge, the patient had a white count of  9.3.  3. Type 2 diabetes.  The patient did have a  history of type 2      diabetes.  The patient was placed on sliding scale insulin during      the hospitalization.  A hemoglobin A1c was obtained which were came      out at 7.3.  CBGs were also obtained during the hospitalization and      ranged from 99-146.  The patient will be discharged back home back      on her home dose of Amaryl 4 mg daily with followup with PCP as      stated.  4. Hypertension, stable.  The patient was maintained on her home dose      of Benicar.  5. Hyperlipidemia.  A fasting lipid panel was checked during the      hospitalization.  The patient was noted to have a total cholesterol      of 183, triglycerides of 249, HDL of 46, LDL of 87.  The patient      was maintained on her home dose of Zocor.  We will defer      hypertriglyceridemia treatment to her PCP as an outpatient.  May      consider dietary modification versus starting the patient on      Tricor.   On the day of discharge, vital signs were temperature 96.9, blood  pressure 109/73, pulse of 70, respirations 22, satting 99% on room air.   Discharge labs were CBC:  White count 9.3, hemoglobin 11.4, platelets  247, hematocrit 32.3, ANC of 5.7.  Sodium of 142, potassium 4.0,  chloride 104, bicarb 28, BUN 14, creatinine 0.59, glucose of 140,  calcium of 8.5, hemoglobin A1c of 7.3, TSH of 2.285.   It was a pleasure taking care of Ms. Chelsea Gray.      Ramiro Harvest, MD  Electronically Signed     DT/MEDQ  D:  07/20/2008  T:  07/20/2008  Job:  [id number]   cc:   Gretta Arab. Valentina Lucks, M.D.

## 2010-08-19 NOTE — H&P (Signed)
NAMEMarland Kitchen  OLETA, GUNNOE NO.:  [id number]   MEDICAL RECORD NO.:  [id number]          PATIENT TYPE:  OBV   LOCATION:  5504                         FACILITY:  [facility]   PHYSICIAN:  Michiel Cowboy, MDDATE OF BIRTH:  14-Jun-1955   DATE OF ADMISSION:  07/18/2008  DATE OF DISCHARGE:                              HISTORY & PHYSICAL   PRIMARY CARE Brinton Brandel:  Gretta Arab. Valentina Lucks, M.D.   CHIEF COMPLAINT:  Chest pain.   The patient is a 55 year old female with history of diabetes,  hypertension hypercholesterolemia; who presents with a 4-day history of  chest pain that comes and goes.  This feels squeezing-like,  not worse with exertion.  No nausea, no diarrhea.  No constipation.  No  fevers.  No chills.  No URI type of symptoms.  Pain is slightly worse  with deep breathing and not radiational.  The patient denies any lower  extremity swelling.  No long trips.  Otherwise, the review of systems  was completely remarkable.   PAST MEDICAL HISTORY:  1. Diabetes mellitus.  2. High cholesterol.  3. Hypertension.   SOCIAL HISTORY:  The patient continues to smoke.  Does not use drugs and  does not drink.   FAMILY HISTORY:  Noncontributory.   ALLERGIES:  No known drug allergies.   MEDICATIONS:  Takes Benicar, although unsure of dosages.   VITALS:  Temperature 97.9, blood pressure 146/81, pulse 70, respirations  20, satting 100% on room air.  The patient appears to be in no acute  distress.  HEAD:  Nontraumatic.  Moist mucous membranes.  LUNGS:  Clear to auscultation bilaterally.  HEART:  Regular rate and rhythm.  No murmurs, rubs or gallops.  The  chest wall is painful to palpation.  She states that the pain is about  the same spot as where her chest pain is.  Seems to be at sternum and  rib junction.  ABDOMEN:  Obese, but nontender and nondistended.  EXTREMITIES:  Lower extremities without clubbing, cyanosis or edema.  NEUROLOGICAL:  The patient is intact.   LABS:   White blood cell count slightly elevated at 12.9, hemoglobin  13.3, sodium 140, potassium 3.9, creatinine 0.8.  Cardiac markers  negative.  D-dimer unremarkable.  EKG showing T-wave flattening in lead  aVF, V4-V6; but otherwise no acute evidence of ischemia.  Chest x-ray  showed mild chronic bronchitic changes but otherwise unremarkable.   ASSESSMENT AND PLAN:  This is a diabetic patient with chest pain, which  is very atypical and reproducible on palpation.  1. Chest pain:  Likely costochondritis, but will cycle cardiac markers      given risk factors.  Check serial EKGs, since it is slightly      abnormal.  Check fasting lipid panel and hemoglobin A1c.  Check      TSH.  The D-dimer was negative, which is very reassuring.  The      patient has no risk factors for deep vein thrombosis or pulmonary      embolus.  2. Hypertension:  Continue Benicar at the presumed dose.  3. Hyperlipidemia:  Continue Zocor  at present dose.  Will need to      clarify home doses.  4. Diabetes:  Will do sliding scale.  Hold p.o. medications for now      while in-house.  5. Elevated white blood cell count:  Chest x-ray was unremarkable.  We      will check urinalysis plus a urine culture and follow.  I wonder if      the patient has a mild viral underlying infection.   PROPHYLAXIS:  Protonix plus Lovenox.   PAIN MANAGEMENT:  Will try Ultram and p.r.n. morphine.      Michiel Cowboy, MD  Electronically Signed     AVD/MEDQ  D:  07/18/2008  T:  07/19/2008  Job:  [id number]

## 2010-08-26 ENCOUNTER — Ambulatory Visit (INDEPENDENT_AMBULATORY_CARE_PROVIDER_SITE_OTHER): Payer: 59 | Admitting: Endocrinology

## 2010-08-26 ENCOUNTER — Encounter: Payer: Self-pay | Admitting: Endocrinology

## 2010-08-26 VITALS — BP 102/62 | HR 84 | Temp 98.7°F | Ht 65.5 in | Wt 213.8 lb

## 2010-08-26 DIAGNOSIS — R609 Edema, unspecified: Secondary | ICD-10-CM

## 2010-08-26 DIAGNOSIS — E119 Type 2 diabetes mellitus without complications: Secondary | ICD-10-CM

## 2010-08-26 MED ORDER — PIOGLITAZONE HCL 30 MG PO TABS: 30.0000 mg | ORAL_TABLET | Freq: Every day | ORAL | Status: DC

## 2010-08-26 NOTE — Progress Notes (Signed)
  Subjective:    Patient ID: Chelsea Gray, female    DOB: 12/08/1955, 55 y.o.   MRN: [id number]  HPI pt states she feels well in general.  no cbg record, but states cbg's are approx 100.  She reports few mos of slight swelling of the legs, but no assoc sob. Past Medical History  Diagnosis Date  . DM 01/24/2009  . HYPERCHOLESTEROLEMIA 01/24/2009  . DEPRESSION 01/24/2009  . HYPERTENSION 01/24/2009    No past surgical history on file.  History   Social History  . Marital Status: Married    Spouse Name: N/A    Number of Children: N/A  . Years of Education: N/A   Occupational History  . Insurance    Social History Main Topics  . Smoking status: Current Everyday Smoker  . Smokeless tobacco: Not on file  . Alcohol Use: Not on file  . Drug Use: Not on file  . Sexually Active: Not on file   Other Topics Concern  . Not on file   Social History Narrative  . No narrative on file    No current outpatient prescriptions on file prior to visit.    Allergies  Allergen Reactions  . Codeine Sulfate     REACTION: dizziness  . Metformin     REACTION: fatigue, diarrhea    Family History  Problem Relation Age of Onset  . Diabetes Mother   . Diabetes Father     BP 102/62  Pulse 84  Temp(Src) 98.7 F (37.1 C) (Oral)  Ht 5' 5.5" (1.664 m)  Wt 213 lb 12.8 oz (96.979 kg)  BMI 35.04 kg/m2  SpO2 95%    Review of Systems denies hypoglycemia.  She has weight gain.    Objective:   Physical Exam GENERAL: no distress Pulses: dorsalis pedis intact bilat.   Feet: no deformity, except the right 2nd and 5th toes are sort (pt says due to surgery).  There is a healed surgical scar at the dorsal aspect of the right foot.no ulcer on the feet.  feet are of normal color and temp.  There is trace bilat leg edema. Neuro: sensation is intact to touch on the feet     (pt says her a1c was 6.1, at her cpx 2 weeks ago, with dr Valentina Lucks).    Assessment & Plan:  Dm,  well-controlled Edema, prob due to actos

## 2010-08-26 NOTE — Patient Instructions (Addendum)
Reduce actos to 30 mg daily.  It is of to use-up your current supply of 45 mg by taking 1/2 tab daily. Please make a follow-up appointment in 4 months. check your blood sugar 1 time a day.  vary the time of day when you check, between before the 3 meals, and at bedtime.  also check if you have symptoms of your blood sugar being too high or too low.  please keep a record of the readings and bring it to your next appointment here.  please call us sooner if you are having low blood sugar episodes. good diet and exercise habits significanly improve the control of your diabetes.  please let me know if you wish to be referred to a dietician.  high blood sugar is very risky to your health.  you should see an eye doctor every year. controlling your blood pressure and cholesterol drastically reduces the damage diabetes does to your body.  this also applies to quitting smoking.  please discuss these with your doctor.  you should take an aspirin every day, unless you have been advised by a doctor not to.

## 2010-09-23 ENCOUNTER — Other Ambulatory Visit: Payer: Self-pay | Admitting: Endocrinology

## 2010-12-24 ENCOUNTER — Other Ambulatory Visit (INDEPENDENT_AMBULATORY_CARE_PROVIDER_SITE_OTHER): Payer: 59

## 2010-12-24 ENCOUNTER — Ambulatory Visit (INDEPENDENT_AMBULATORY_CARE_PROVIDER_SITE_OTHER): Payer: 59 | Admitting: Endocrinology

## 2010-12-24 DIAGNOSIS — E119 Type 2 diabetes mellitus without complications: Secondary | ICD-10-CM

## 2010-12-24 DIAGNOSIS — R109 Unspecified abdominal pain: Secondary | ICD-10-CM

## 2010-12-24 LAB — CBC WITH DIFFERENTIAL/PLATELET
Basophils Absolute: 0.1 10*3/uL (ref 0.0–0.1)
Lymphocytes Relative: 29.4 % (ref 12.0–46.0)
Monocytes Relative: 4.6 % (ref 3.0–12.0)
Neutrophils Relative %: 63.7 % (ref 43.0–77.0)
Platelets: 276 10*3/uL (ref 150.0–400.0)
RDW: 13.9 % (ref 11.5–14.6)
WBC: 10.2 10*3/uL (ref 4.5–10.5)

## 2010-12-24 LAB — HEPATIC FUNCTION PANEL
Albumin: 3.7 g/dL (ref 3.5–5.2)
Alkaline Phosphatase: 82 U/L (ref 39–117)
Total Protein: 7.3 g/dL (ref 6.0–8.3)

## 2010-12-24 LAB — BASIC METABOLIC PANEL
BUN: 27 mg/dL — ABNORMAL HIGH (ref 6–23)
Calcium: 8.8 mg/dL (ref 8.4–10.5)
GFR: 87.69 mL/min (ref >60.00)
Glucose, Bld: 121 mg/dL — ABNORMAL HIGH (ref 70–99)
Sodium: 142 mEq/L (ref 135–145)

## 2010-12-24 LAB — URINALYSIS, ROUTINE W REFLEX MICROSCOPIC
Ketones, ur: NEGATIVE
Leukocytes, UA: NEGATIVE
Nitrite: NEGATIVE
Specific Gravity, Urine: >=1.03 (ref 1.000–1.030)
Urobilinogen, UA: 1 (ref 0.0–1.0)

## 2010-12-24 LAB — AMYLASE: Amylase: 54 U/L (ref 27–131)

## 2010-12-24 MED ORDER — INSULIN PEN NEEDLE 31G X 8 MM MISC: Status: DC

## 2010-12-24 NOTE — Progress Notes (Signed)
Subjective:    Patient ID: Chelsea Gray, female    DOB: Jul 17, 1955, 55 y.o.   MRN: [id number]  HPI pt reports a few days moderate epigastric pain, and assoc myalgias.  It is better now.  no cbg record, but states cbg's are well-controlled.  Past Medical History  Diagnosis Date  . DM 01/24/2009  . HYPERCHOLESTEROLEMIA 01/24/2009  . DEPRESSION 01/24/2009  . HYPERTENSION 01/24/2009    No past surgical history on file.  History   Social History  . Marital Status: Married    Spouse Name: N/A    Number of Children: N/A  . Years of Education: N/A   Occupational History  . Insurance    Social History Main Topics  . Smoking status: Current Everyday Smoker  . Smokeless tobacco: Not on file  . Alcohol Use: Not on file  . Drug Use: Not on file  . Sexually Active: Not on file   Other Topics Concern  . Not on file   Social History Narrative  . No narrative on file    Current Outpatient Prescriptions on File Prior to Visit  Medication Sig Dispense Refill  . ALPRAZolam (XANAX) 0.25 MG tablet Take 0.25 mg by mouth as needed.        . bromocriptine (PARLODEL) 2.5 MG tablet Take 2.5 mg by mouth at bedtime.        Marland Kitchen glimepiride (AMARYL) 1 MG tablet TAKE 1 TABLET BY MOUTH EVERY MORNING  30 tablet  5  . Liraglutide (VICTOZA) 18 MG/3ML SOLN Inject 1.8 mg into the skin daily.        Marland Kitchen olmesartan (BENICAR) 5 MG tablet Take 5 mg by mouth daily.        . pioglitazone (ACTOS) 30 MG tablet Take 1 tablet (30 mg total) by mouth daily.  90 tablet  3  . simvastatin (ZOCOR) 40 MG tablet Take 40 mg by mouth at bedtime.        . varenicline (CHANTIX STARTING MONTH PAK) 0.5 MG X 11 & 1 MG X 42 tablet Take by mouth 2 (two) times daily. Take one 0.5mg  tablet by mouth once daily for 3 days, then increase to one 0.5mg  tablet twice daily for 3 days, then increase to one 1mg  tablet twice daily.        No current facility-administered medications on file prior to visit.    Allergies  Allergen  Reactions  . Codeine Sulfate     REACTION: dizziness  . Metformin     REACTION: fatigue, diarrhea   Family History  Problem Relation Age of Onset  . Diabetes Mother   . Diabetes Father    BP 122/78  Pulse 79  Temp(Src) 98.3 F (36.8 C) (Oral)  Ht 5\' 6"  (1.676 m)  Wt 215 lb 12.8 oz (97.886 kg)  BMI 34.83 kg/m2  SpO2 95%  Review of Systems Denies n/v/fever/diarrhea/bpbpr    Objective:   Physical Exam VITAL SIGNS:  See vs page GENERAL: no distress ABDOMEN: abdomen is soft, nontender.  no hepatosplenomegaly.   not distended.  no hernia  Lab Results  Component Value Date   WBC 10.2 12/24/2010   HGB 12.3 12/24/2010   HCT 36.6 12/24/2010   PLT 276.0 12/24/2010   CHOL 164 03/18/2010   TRIG 127.0 03/18/2010   HDL 62.30 03/18/2010   ALT 23 12/24/2010   AST 24 12/24/2010   NA 142 12/24/2010   K 4.0 12/24/2010   CL 109 12/24/2010   CREATININE 0.9 12/24/2010  BUN 27* 12/24/2010   CO2 26 12/24/2010   TSH 2.285 Test methodology is 3rd generation TSH 07/19/2008   INR 1.0 07/19/2008   HGBA1C 7.5* 12/24/2010   MICROALBUR 0.6 06/19/2009      Assessment & Plan:  abd pain, new.  uncertain etiology Dm, this is the best control this pt should aim for, given this sulfonylurea-containing regimen

## 2010-12-24 NOTE — Patient Instructions (Addendum)
Please make a follow-up appointment in 4 months. check your blood sugar 1 time a day.  vary the time of day when you check, between before the 3 meals, and at bedtime.  also check if you have symptoms of your blood sugar being too high or too low.  please keep a record of the readings and bring it to your next appointment here.  please call us sooner if you are having low blood sugar episodes. Please see dr Valentina Lucks if symptoms are not gone in 2 days. blood tests, and an ultrasound, are being requested for you today.  please call [phone] to hear each of your test results.  You will be prompted to enter the 9-digit "MRN" number that appears at the top left of this page, followed by #.  Then you will hear the message. Here are some "prilosec" 20 mg pills to take 1 daily, until you feel better, on a trial basis.

## 2010-12-25 ENCOUNTER — Other Ambulatory Visit: Payer: 59

## 2010-12-26 ENCOUNTER — Other Ambulatory Visit: Payer: Self-pay | Admitting: Family Medicine

## 2010-12-26 ENCOUNTER — Ambulatory Visit
Admission: RE | Admit: 2010-12-26 | Discharge: 2010-12-26 | Disposition: A | Discharge status: Home / Self Care | Payer: 59 | Source: Ambulatory Visit | Attending: Family Medicine | Admitting: Family Medicine

## 2010-12-26 ENCOUNTER — Ambulatory Visit
Admission: RE | Admit: 2010-12-26 | Discharge: 2010-12-26 | Disposition: A | Discharge status: Home / Self Care | Payer: 59 | Source: Ambulatory Visit | Attending: Endocrinology | Admitting: Endocrinology

## 2010-12-26 DIAGNOSIS — R1011 Right upper quadrant pain: Secondary | ICD-10-CM

## 2010-12-26 DIAGNOSIS — R109 Unspecified abdominal pain: Secondary | ICD-10-CM

## 2010-12-26 MED ORDER — IOHEXOL 300 MG/ML  SOLN
100.0000 mL | Freq: Once | INTRAMUSCULAR | Status: AC | PRN
  Administered 2010-12-26: 100 mL via INTRAVENOUS

## 2011-01-22 ENCOUNTER — Other Ambulatory Visit: Payer: Self-pay | Admitting: *Deleted

## 2011-01-22 MED ORDER — PIOGLITAZONE HCL 30 MG PO TABS: 30.0000 mg | ORAL_TABLET | Freq: Every day | ORAL | Status: DC

## 2011-01-22 NOTE — Telephone Encounter (Signed)
Pt called requesting refill of Actos to her pharmacy-she lost rx that was printed for her. Pt informed rx sent.

## 2011-03-21 ENCOUNTER — Other Ambulatory Visit: Payer: Self-pay | Admitting: Endocrinology

## 2011-04-19 ENCOUNTER — Other Ambulatory Visit: Payer: Self-pay | Admitting: Endocrinology

## 2011-04-22 ENCOUNTER — Encounter: Payer: Self-pay | Admitting: Endocrinology

## 2011-04-22 ENCOUNTER — Ambulatory Visit (INDEPENDENT_AMBULATORY_CARE_PROVIDER_SITE_OTHER): Payer: 59 | Admitting: Endocrinology

## 2011-04-22 ENCOUNTER — Other Ambulatory Visit (INDEPENDENT_AMBULATORY_CARE_PROVIDER_SITE_OTHER): Payer: 59

## 2011-04-22 VITALS — BP 122/70 | HR 80 | Temp 97.8°F | Ht 66.0 in | Wt 209.6 lb

## 2011-04-22 DIAGNOSIS — E119 Type 2 diabetes mellitus without complications: Secondary | ICD-10-CM

## 2011-04-22 LAB — HEMOGLOBIN A1C: Hgb A1c MFr Bld: 7.6 % — ABNORMAL HIGH (ref 4.6–6.5)

## 2011-04-22 MED ORDER — LIRAGLUTIDE 18 MG/3ML ~~LOC~~ SOLN: 1.8000 mg | SUBCUTANEOUS | Status: DC

## 2011-04-22 NOTE — Patient Instructions (Addendum)
Please make a follow-up appointment in 4 months. check your blood sugar 1 time a day.  vary the time of day when you check, between before the 3 meals, and at bedtime.  also check if you have symptoms of your blood sugar being too high or too low.  please keep a record of the readings and bring it to your next appointment here.  please call us sooner if you are having low blood sugar episodes. blood tests are being requested for you today.  please call [phone] to hear your test results.  You will be prompted to enter the 9-digit "MRN" number that appears at the top left of this page, followed by #.  Then you will hear the message.  (update: i left message on phone-tree:  rx as we discussed)

## 2011-04-22 NOTE — Progress Notes (Signed)
  Subjective:    Patient ID: Chelsea Gray, female    DOB: 06/16/1955, 56 y.o.   MRN: [id number]  HPI Pt returns for f/u of type 2 DM (2006).  pt states she feels well in general.  no cbg record, but states cbg's are well-controlled Past Medical History  Diagnosis Date  . DM 01/24/2009  . HYPERCHOLESTEROLEMIA 01/24/2009  . DEPRESSION 01/24/2009  . HYPERTENSION 01/24/2009    No past surgical history on file.  History   Social History  . Marital Status: Married    Spouse Name: N/A    Number of Children: N/A  . Years of Education: N/A   Occupational History  . Insurance    Social History Main Topics  . Smoking status: Current Everyday Smoker  . Smokeless tobacco: Not on file  . Alcohol Use: Not on file  . Drug Use: Not on file  . Sexually Active: Not on file   Other Topics Concern  . Not on file   Social History Narrative  . No narrative on file    Current Outpatient Prescriptions on File Prior to Visit  Medication Sig Dispense Refill  . ALPRAZolam (XANAX) 0.25 MG tablet Take 0.25 mg by mouth as needed.        . bromocriptine (PARLODEL) 2.5 MG tablet Take 2.5 mg by mouth at bedtime.        Marland Kitchen glimepiride (AMARYL) 1 MG tablet TAKE 1 TABLET BY MOUTH EVERY MORNING  30 tablet  2  . Insulin Pen Needle (B-D ULTRAFINE III SHORT PEN) 31G X 8 MM MISC 1 once daily  30 each  11  . olmesartan (BENICAR) 5 MG tablet Take 5 mg by mouth daily.        . pioglitazone (ACTOS) 30 MG tablet Take 1 tablet (30 mg total) by mouth daily.  90 tablet  2  . simvastatin (ZOCOR) 40 MG tablet Take 40 mg by mouth at bedtime.          Allergies  Allergen Reactions  . Codeine Sulfate     REACTION: dizziness  . Metformin     REACTION: fatigue, diarrhea    Family History  Problem Relation Age of Onset  . Diabetes Mother   . Diabetes Father     BP 122/70  Pulse 80  Temp(Src) 97.8 F (36.6 C) (Oral)  Ht 5\' 6"  (1.676 m)  Wt 209 lb 9.6 oz (95.074 kg)  BMI 33.83 kg/m2  SpO2 96%  Review  of Systems denies hypoglycemia    Objective:   Physical Exam Pulses: dorsalis pedis intact bilat.   Feet: no deformity.  no ulcer on the feet.  feet are of normal color and temp.  no edema.  There is bilateral onychomycosis.  Neuro: sensation is intact to touch on the feet   Lab Results  Component Value Date   HGBA1C 7.6* 04/22/2011      Assessment & Plan:  DM, this is the best control this pt should aim for, given this sulfonylurea-containing regimen.

## 2011-05-14 ENCOUNTER — Other Ambulatory Visit (HOSPITAL_COMMUNITY): Payer: Self-pay | Admitting: Orthopedic Surgery

## 2011-05-19 NOTE — H&P (Signed)
NAME:  RUBIE, FICCO NO.:  [id number]  MEDICAL RECORD NO.:  [id number]  LOCATION:                                 FACILITY:  PHYSICIAN:  Burnard Bunting, M.D.    DATE OF BIRTH:  [birth year]  DATE OF ADMISSION:  06/13/2011 DATE OF DISCHARGE:                             HISTORY & PHYSICAL   CHIEF COMPLAINT:  Left knee pain.  HISTORY OF PRESENT ILLNESS:  Deedra Pro is a 56 year old patient with left knee pain.  Her old notes were reviewed.  She had a cortisone injection on  December 28 by Dr. Prince Rome, which did not help much.  She reports swelling, popping, limping.  Limited range of motion.  She is taking Norco and Mobic, which is not helping.  Localizes the pain primarily anterior and lateral.  She has a tingling sensation.  The patient really just wants to be able to walk.  She has significant decrease in activity.  She does not plan to do a lot of heavy duty activity.  She can walk about a city block now before she has to stop because of pain.  The patient does smoke and does have diabetes.  Her past family and social history is updated and unchanged.  No interval hospitalizations or surgeries.  All systems reviewed are negative except as related to the left knee.  PHYSICAL EXAMINATION:  GENERAL:  She is well developed, well nourished, no acute distress, alert and oriented, normal body mass index.  Antalgic gait to the left, fairly pronounced today.  She has palpable pedal pulses.  She has slight valgus alignment, left lower extremity compared to the right.  She has basically full range of motion, but lateral joint line tenderness on the left.  The right collateral cruciate ligaments are stable.  Extensor mechanism is intact.  I reviewed her old studies including radiographs, 2 views left knee ordered and obtained today, compared to radiographs of the left knee done 3 years ago.  She has fairly rapidly progressive joint space narrowing on the lateral  side, bone on bone changes with osteophyte formation.  IMPRESSION:  Lateral compartment arthritis in a patient with multiple other medical comorbidities.  It has been progressive since her lateral meniscectomy done 2-3 years ago.  PLAN:  I had a long talk with Dondra Spry today about operative and nonoperative management for this problem.  For now, she just needs some pain relief.  She just had a cortisone injection and I do not think that is going to help again.  I would favor a Synvisc-One injection which is performed through the superolateral approach after sterile prep and drape with lidocaine.  The patient did tolerate that injection well.  In regard to the total knee replacement, I think that is in her future. She wants to go ahead and get that done because of the fairly progressive and debilitating pain.  I think with the bone-on-bone changes, it is not unreasonable to consider that option.  Risks and benefits were discussed with the patient including but not limited to infection, nerve and vessel damage, deep vein thrombosis, limited range of motion, incomplete pain relief, need for revision in her lifetime. She  also is at a slightly higher risk of infection because of her diabetes and smoking.  All this was explained to the patient.  However, I think that in her case, the benefits of being able to walk with less pain outweigh the risk.  She is agreeable to total knee replacement.  All questions answered.  Medical decision making today is complicated by decision for surgery.  Her diabetes does appear to be under good control, with hemoglobin A1c in the 6 range __________ low 7 range.     Burnard Bunting, M.D.     GSD/MEDQ  D:  05/18/2011  T:  05/19/2011  Job:  [id number]

## 2011-06-12 ENCOUNTER — Other Ambulatory Visit (HOSPITAL_COMMUNITY): Payer: 59

## 2011-06-29 ENCOUNTER — Encounter (HOSPITAL_COMMUNITY): Payer: Self-pay | Admitting: Pharmacy Technician

## 2011-07-02 ENCOUNTER — Encounter (HOSPITAL_COMMUNITY): Payer: Self-pay

## 2011-07-02 ENCOUNTER — Encounter (HOSPITAL_COMMUNITY)
Admission: RE | Admit: 2011-07-02 | Discharge: 2011-07-02 | Disposition: A | Discharge status: Home / Self Care | Payer: 59 | Source: Ambulatory Visit | Attending: Orthopedic Surgery | Admitting: Orthopedic Surgery

## 2011-07-02 ENCOUNTER — Other Ambulatory Visit: Payer: Self-pay

## 2011-07-02 HISTORY — DX: Unspecified osteoarthritis, unspecified site: M19.90

## 2011-07-02 HISTORY — DX: Encounter for other specified aftercare: Z51.89

## 2011-07-02 HISTORY — DX: Other pulmonary embolism without acute cor pulmonale: I26.99

## 2011-07-02 HISTORY — DX: Reserved for inherently not codable concepts without codable children: IMO0001

## 2011-07-02 HISTORY — DX: Personal history of other specified conditions: Z87.898

## 2011-07-02 HISTORY — DX: Personal history of other complications of pregnancy, childbirth and the puerperium: Z87.59

## 2011-07-02 LAB — CBC
HCT: 41.1 % (ref 36.0–46.0)
Hemoglobin: 13.9 g/dL (ref 12.0–15.0)
MCH: 31.2 pg (ref 26.0–34.0)
MCHC: 33.8 g/dL (ref 30.0–36.0)
MCV: 92.2 fL (ref 78.0–100.0)

## 2011-07-02 LAB — SURGICAL PCR SCREEN
MRSA, PCR: NEGATIVE
Staphylococcus aureus: NEGATIVE

## 2011-07-02 LAB — BASIC METABOLIC PANEL
BUN: 27 mg/dL — ABNORMAL HIGH (ref 6–23)
Chloride: 101 mEq/L (ref 96–112)
Glucose, Bld: 77 mg/dL (ref 70–99)
Potassium: 4.8 mEq/L (ref 3.5–5.1)

## 2011-07-02 LAB — DIFFERENTIAL
Basophils Absolute: 0 10*3/uL (ref 0.0–0.1)
Basophils Relative: 0 % (ref 0–1)
Eosinophils Absolute: 0.2 10*3/uL (ref 0.0–0.7)
Eosinophils Relative: 1 % (ref 0–5)
Lymphocytes Relative: 38 % (ref 12–46)
Lymphs Abs: 5.5 10*3/uL — ABNORMAL HIGH (ref 0.7–4.0)
Monocytes Absolute: 0.8 10*3/uL (ref 0.1–1.0)
Monocytes Relative: 6 % (ref 3–12)
Neutro Abs: 8.1 10*3/uL — ABNORMAL HIGH (ref 1.7–7.7)
Neutrophils Relative %: 55 % (ref 43–77)

## 2011-07-02 LAB — URINALYSIS, ROUTINE W REFLEX MICROSCOPIC
Bilirubin Urine: NEGATIVE
Glucose, UA: NEGATIVE mg/dL
Hgb urine dipstick: NEGATIVE
Ketones, ur: NEGATIVE mg/dL
Leukocytes, UA: NEGATIVE
Nitrite: NEGATIVE
Protein, ur: NEGATIVE mg/dL
Specific Gravity, Urine: 1.022 (ref 1.005–1.030)
Urobilinogen, UA: 0.2 mg/dL (ref 0.0–1.0)
pH: 6.5 (ref 5.0–8.0)

## 2011-07-02 LAB — ABO/RH: ABO/RH(D): B POS

## 2011-07-02 LAB — TYPE AND SCREEN
ABO/RH(D): B POS
Antibody Screen: NEGATIVE

## 2011-07-02 NOTE — Pre-Procedure Instructions (Signed)
20 Chelsea Gray  07/02/2011   Your procedure is scheduled on: April 9  Report to [facility] at 0530 AM.  Call this number if you have problems the morning of surgery: [phone]   Remember:   Do not eat food:After Midnight.  May have clear liquids: up to 4 Hours before arrival.  Clear liquids include soda, tea, black coffee, apple or grape juice, broth.  Take these medicines the morning of surgery with A SIP OF WATER:    Do not wear jewelry, make-up or nail polish.  Do not wear lotions, powders, or perfumes. You may wear deodorant.  Do not shave 48 hours prior to surgery.  Do not bring valuables to the hospital.  Contacts, dentures or bridgework may not be worn into surgery.  Leave suitcase in the car. After surgery it may be brought to your room.  For patients admitted to the hospital, checkout time is 11:00 AM the day of discharge.   Special Instructions: CHG Shower Use Special Wash: 1/2 bottle night before surgery and 1/2 bottle morning of surgery.   Please read over the following fact sheets that you were given: Pain Booklet, Coughing and Deep Breathing, Blood Transfusion Information, Total Joint Packet and Surgical Site Infection Prevention

## 2011-07-03 ENCOUNTER — Other Ambulatory Visit (HOSPITAL_COMMUNITY): Payer: 59

## 2011-07-03 LAB — URINE CULTURE: Culture  Setup Time: 201303281503

## 2011-07-13 MED ORDER — CEFAZOLIN SODIUM-DEXTROSE 2-3 GM-% IV SOLR: 2.0000 g | INTRAVENOUS | Status: DC

## 2011-07-13 MED ORDER — CHLORHEXIDINE GLUCONATE 4 % EX LIQD: 60.0000 mL | Freq: Once | CUTANEOUS | Status: DC

## 2011-07-14 ENCOUNTER — Inpatient Hospital Stay (HOSPITAL_COMMUNITY)
Admission: RE | Admit: 2011-07-14 | Discharge: 2011-07-17 | DRG: 470 | Disposition: A | Discharge status: Home Health | Payer: 59 | Source: Ambulatory Visit | Attending: Orthopedic Surgery | Admitting: Orthopedic Surgery

## 2011-07-14 ENCOUNTER — Encounter (HOSPITAL_COMMUNITY): Payer: Self-pay | Admitting: Anesthesiology

## 2011-07-14 ENCOUNTER — Encounter (HOSPITAL_COMMUNITY)
Admission: RE | Disposition: A | Discharge status: Home Health | Payer: Self-pay | Source: Ambulatory Visit | Attending: Orthopedic Surgery

## 2011-07-14 ENCOUNTER — Encounter (HOSPITAL_COMMUNITY): Payer: Self-pay | Admitting: *Deleted

## 2011-07-14 ENCOUNTER — Ambulatory Visit (HOSPITAL_COMMUNITY): Payer: 59

## 2011-07-14 ENCOUNTER — Ambulatory Visit (HOSPITAL_COMMUNITY): Payer: 59 | Admitting: Anesthesiology

## 2011-07-14 DIAGNOSIS — M1710 Unilateral primary osteoarthritis, unspecified knee: Principal | ICD-10-CM | POA: Diagnosis present

## 2011-07-14 DIAGNOSIS — Z79899 Other long term (current) drug therapy: Secondary | ICD-10-CM

## 2011-07-14 DIAGNOSIS — M1712 Unilateral primary osteoarthritis, left knee: Secondary | ICD-10-CM

## 2011-07-14 DIAGNOSIS — Z7901 Long term (current) use of anticoagulants: Secondary | ICD-10-CM

## 2011-07-14 DIAGNOSIS — Z01812 Encounter for preprocedural laboratory examination: Secondary | ICD-10-CM

## 2011-07-14 DIAGNOSIS — Z794 Long term (current) use of insulin: Secondary | ICD-10-CM

## 2011-07-14 HISTORY — PX: KNEE ARTHROPLASTY: SHX992

## 2011-07-14 LAB — GLUCOSE, CAPILLARY: Glucose-Capillary: 111 mg/dL — ABNORMAL HIGH (ref 70–99)

## 2011-07-14 SURGERY — ARTHROPLASTY, KNEE, TOTAL, USING IMAGELESS COMPUTER-ASSISTED NAVIGATION
Anesthesia: General | Site: Knee | Laterality: Left | Wound class: Clean

## 2011-07-14 MED ORDER — IRBESARTAN 300 MG PO TABS
300.0000 mg | ORAL_TABLET | Freq: Every day | ORAL | Status: DC
  Administered 2011-07-14 – 2011-07-17 (×4): 300 mg via ORAL
  Filled 2011-07-14 (×4): qty 1

## 2011-07-14 MED ORDER — HYDROMORPHONE 0.3 MG/ML IV SOLN
INTRAVENOUS | Status: DC
  Administered 2011-07-14: 0.3 mg via INTRAVENOUS
  Administered 2011-07-14: 0.9 mg via INTRAVENOUS
  Administered 2011-07-15: 2.7 mg via INTRAVENOUS
  Administered 2011-07-15: 3 mg via INTRAVENOUS

## 2011-07-14 MED ORDER — ONDANSETRON HCL 4 MG/2ML IJ SOLN
4.0000 mg | Freq: Four times a day (QID) | INTRAMUSCULAR | Status: DC | PRN
  Filled 2011-07-14: qty 2

## 2011-07-14 MED ORDER — DIPHENHYDRAMINE HCL 50 MG/ML IJ SOLN
12.5000 mg | Freq: Four times a day (QID) | INTRAMUSCULAR | Status: DC | PRN
  Administered 2011-07-14: 12.5 mg via INTRAVENOUS
  Filled 2011-07-14: qty 0.25
  Filled 2011-07-14: qty 1

## 2011-07-14 MED ORDER — INSULIN ASPART 100 UNIT/ML ~~LOC~~ SOLN
0.0000 [IU] | Freq: Three times a day (TID) | SUBCUTANEOUS | Status: DC
  Administered 2011-07-15 (×3): 2 [IU] via SUBCUTANEOUS
  Administered 2011-07-16 (×3): 3 [IU] via SUBCUTANEOUS
  Administered 2011-07-17: 2 [IU] via SUBCUTANEOUS

## 2011-07-14 MED ORDER — CLONIDINE HCL (ANALGESIA) 100 MCG/ML EP SOLN
150.0000 ug | EPIDURAL | Status: DC
  Filled 2011-07-14: qty 1.5

## 2011-07-14 MED ORDER — ONDANSETRON HCL 4 MG PO TABS: 4.0000 mg | ORAL_TABLET | Freq: Four times a day (QID) | ORAL | Status: DC | PRN

## 2011-07-14 MED ORDER — LACTATED RINGERS IV SOLN
INTRAVENOUS | Status: DC | PRN
  Administered 2011-07-14 (×3): via INTRAVENOUS

## 2011-07-14 MED ORDER — ACETAMINOPHEN 10 MG/ML IV SOLN
INTRAVENOUS | Status: AC
  Filled 2011-07-14: qty 100

## 2011-07-14 MED ORDER — GLYCOPYRROLATE 0.2 MG/ML IJ SOLN
INTRAMUSCULAR | Status: DC | PRN
  Administered 2011-07-14: .8 mg via INTRAVENOUS

## 2011-07-14 MED ORDER — SIMVASTATIN 40 MG PO TABS
40.0000 mg | ORAL_TABLET | Freq: Every day | ORAL | Status: DC
  Administered 2011-07-14 – 2011-07-16 (×3): 40 mg via ORAL
  Filled 2011-07-14 (×4): qty 1

## 2011-07-14 MED ORDER — PHENYLEPHRINE HCL 10 MG/ML IJ SOLN
INTRAMUSCULAR | Status: DC | PRN
  Administered 2011-07-14 (×2): 40 ug via INTRAVENOUS

## 2011-07-14 MED ORDER — PHENOL 1.4 % MT LIQD: 1.0000 | OROMUCOSAL | Status: DC | PRN

## 2011-07-14 MED ORDER — SODIUM CHLORIDE 0.9 % IJ SOLN: 9.0000 mL | INTRAMUSCULAR | Status: DC | PRN

## 2011-07-14 MED ORDER — HYDROMORPHONE 0.3 MG/ML IV SOLN
INTRAVENOUS | Status: AC
  Administered 2011-07-14: 21:00:00
  Filled 2011-07-14: qty 25

## 2011-07-14 MED ORDER — PROMETHAZINE HCL 25 MG/ML IJ SOLN
6.2500 mg | Freq: Once | INTRAMUSCULAR | Status: AC
  Administered 2011-07-14: 6.25 mg via INTRAVENOUS

## 2011-07-14 MED ORDER — MENTHOL 3 MG MT LOZG: 1.0000 | LOZENGE | OROMUCOSAL | Status: DC | PRN

## 2011-07-14 MED ORDER — HYDROCODONE-ACETAMINOPHEN 10-325 MG PO TABS
1.0000 | ORAL_TABLET | ORAL | Status: DC | PRN
  Administered 2011-07-15 – 2011-07-17 (×7): 2 via ORAL
  Filled 2011-07-14 (×9): qty 2

## 2011-07-14 MED ORDER — MIDAZOLAM HCL 5 MG/5ML IJ SOLN
INTRAMUSCULAR | Status: DC | PRN
  Administered 2011-07-14: 2 mg via INTRAVENOUS

## 2011-07-14 MED ORDER — CLONIDINE HCL (ANALGESIA) 100 MCG/ML EP SOLN
EPIDURAL | Status: DC | PRN
  Administered 2011-07-14: 1 mL via INTRA_ARTICULAR

## 2011-07-14 MED ORDER — INSULIN ASPART 100 UNIT/ML ~~LOC~~ SOLN: 0.0000 [IU] | Freq: Every day | SUBCUTANEOUS | Status: DC

## 2011-07-14 MED ORDER — WARFARIN - PHARMACIST DOSING INPATIENT: Freq: Every day | Status: DC

## 2011-07-14 MED ORDER — BUPIVACAINE-EPINEPHRINE 0.5% -1:200000 IJ SOLN
INTRAMUSCULAR | Status: DC | PRN
  Administered 2011-07-14: 30 mL

## 2011-07-14 MED ORDER — ROCURONIUM BROMIDE 100 MG/10ML IV SOLN
INTRAVENOUS | Status: DC | PRN
  Administered 2011-07-14: 50 mg via INTRAVENOUS
  Administered 2011-07-14 (×2): 10 mg via INTRAVENOUS

## 2011-07-14 MED ORDER — CEFAZOLIN SODIUM 1-5 GM-% IV SOLN
1.0000 g | Freq: Four times a day (QID) | INTRAVENOUS | Status: DC
  Administered 2011-07-14: 1 g via INTRAVENOUS
  Filled 2011-07-14 (×3): qty 50

## 2011-07-14 MED ORDER — ACETAMINOPHEN 10 MG/ML IV SOLN
INTRAVENOUS | Status: DC | PRN
  Administered 2011-07-14: 1000 mg via INTRAVENOUS

## 2011-07-14 MED ORDER — PIOGLITAZONE HCL 30 MG PO TABS
30.0000 mg | ORAL_TABLET | Freq: Every day | ORAL | Status: DC
  Administered 2011-07-14 – 2011-07-17 (×4): 30 mg via ORAL
  Filled 2011-07-14 (×5): qty 1

## 2011-07-14 MED ORDER — SUFENTANIL CITRATE 50 MCG/ML IV SOLN
INTRAVENOUS | Status: DC | PRN
  Administered 2011-07-14: 10 ug via INTRAVENOUS
  Administered 2011-07-14: 25 ug via INTRAVENOUS
  Administered 2011-07-14 (×2): 10 ug via INTRAVENOUS
  Administered 2011-07-14: 5 ug via INTRAVENOUS
  Administered 2011-07-14: 10 ug via INTRAVENOUS

## 2011-07-14 MED ORDER — HYDROCHLOROTHIAZIDE 12.5 MG PO CAPS
12.5000 mg | ORAL_CAPSULE | Freq: Every day | ORAL | Status: DC
  Administered 2011-07-14 – 2011-07-17 (×4): 12.5 mg via ORAL
  Filled 2011-07-14 (×4): qty 1

## 2011-07-14 MED ORDER — METHOCARBAMOL 100 MG/ML IJ SOLN
500.0000 mg | Freq: Four times a day (QID) | INTRAVENOUS | Status: DC | PRN
  Administered 2011-07-14: 500 mg via INTRAVENOUS
  Filled 2011-07-14: qty 5

## 2011-07-14 MED ORDER — ACETAMINOPHEN 325 MG PO TABS: 650.0000 mg | ORAL_TABLET | Freq: Four times a day (QID) | ORAL | Status: DC | PRN

## 2011-07-14 MED ORDER — CEFAZOLIN SODIUM 1-5 GM-% IV SOLN
1.0000 g | Freq: Four times a day (QID) | INTRAVENOUS | Status: AC
  Administered 2011-07-14 – 2011-07-15 (×2): 1 g via INTRAVENOUS
  Filled 2011-07-14 (×2): qty 50

## 2011-07-14 MED ORDER — NALOXONE HCL 0.4 MG/ML IJ SOLN
0.4000 mg | INTRAMUSCULAR | Status: DC | PRN
  Filled 2011-07-14: qty 1

## 2011-07-14 MED ORDER — LIRAGLUTIDE 18 MG/3ML ~~LOC~~ SOLN: 1.8000 mg | Freq: Every day | SUBCUTANEOUS | Status: DC

## 2011-07-14 MED ORDER — PROPOFOL 10 MG/ML IV EMUL
INTRAVENOUS | Status: DC | PRN
  Administered 2011-07-14: 170 mg via INTRAVENOUS

## 2011-07-14 MED ORDER — MORPHINE SULFATE 4 MG/ML IJ SOLN
INTRAMUSCULAR | Status: DC | PRN
  Administered 2011-07-14: 8 mg

## 2011-07-14 MED ORDER — ONDANSETRON HCL 4 MG/2ML IJ SOLN
INTRAMUSCULAR | Status: DC | PRN
  Administered 2011-07-14: 4 mg via INTRAVENOUS

## 2011-07-14 MED ORDER — METHOCARBAMOL 500 MG PO TABS
500.0000 mg | ORAL_TABLET | Freq: Four times a day (QID) | ORAL | Status: DC | PRN
  Administered 2011-07-15 – 2011-07-17 (×4): 500 mg via ORAL
  Filled 2011-07-14 (×5): qty 1

## 2011-07-14 MED ORDER — POTASSIUM CHLORIDE IN NACL 20-0.9 MEQ/L-% IV SOLN
INTRAVENOUS | Status: DC
  Administered 2011-07-14 – 2011-07-16 (×2): via INTRAVENOUS
  Filled 2011-07-14 (×7): qty 1000

## 2011-07-14 MED ORDER — ONDANSETRON HCL 4 MG/2ML IJ SOLN
4.0000 mg | Freq: Four times a day (QID) | INTRAMUSCULAR | Status: DC | PRN
  Administered 2011-07-15 (×2): 4 mg via INTRAVENOUS
  Filled 2011-07-14: qty 2

## 2011-07-14 MED ORDER — CEFAZOLIN SODIUM-DEXTROSE 2-3 GM-% IV SOLR
INTRAVENOUS | Status: AC
  Filled 2011-07-14: qty 50

## 2011-07-14 MED ORDER — DIPHENHYDRAMINE HCL 12.5 MG/5ML PO ELIX
12.5000 mg | ORAL_SOLUTION | Freq: Four times a day (QID) | ORAL | Status: DC | PRN
  Administered 2011-07-15: 12.5 mg via ORAL
  Filled 2011-07-14: qty 5
  Filled 2011-07-14: qty 10

## 2011-07-14 MED ORDER — NEOSTIGMINE METHYLSULFATE 1 MG/ML IJ SOLN
INTRAMUSCULAR | Status: DC | PRN
  Administered 2011-07-14: 5 mg via INTRAVENOUS

## 2011-07-14 MED ORDER — SENNA 8.6 MG PO TABS
1.0000 | ORAL_TABLET | Freq: Two times a day (BID) | ORAL | Status: DC
  Administered 2011-07-14 – 2011-07-17 (×6): 8.6 mg via ORAL
  Filled 2011-07-14 (×10): qty 1

## 2011-07-14 MED ORDER — HYDROMORPHONE HCL PF 1 MG/ML IJ SOLN
0.2500 mg | INTRAMUSCULAR | Status: DC | PRN
  Administered 2011-07-14 (×4): 0.5 mg via INTRAVENOUS

## 2011-07-14 MED ORDER — METOCLOPRAMIDE HCL 10 MG PO TABS: 5.0000 mg | ORAL_TABLET | Freq: Three times a day (TID) | ORAL | Status: DC | PRN

## 2011-07-14 MED ORDER — METOCLOPRAMIDE HCL 5 MG/ML IJ SOLN: 5.0000 mg | Freq: Three times a day (TID) | INTRAMUSCULAR | Status: DC | PRN

## 2011-07-14 MED ORDER — COUMADIN BOOK
Freq: Once | Status: AC
  Administered 2011-07-14: 19:00:00
  Filled 2011-07-14: qty 1

## 2011-07-14 MED ORDER — WARFARIN VIDEO: Freq: Once | Status: DC

## 2011-07-14 MED ORDER — WARFARIN SODIUM 7.5 MG PO TABS
7.5000 mg | ORAL_TABLET | Freq: Once | ORAL | Status: AC
  Administered 2011-07-14: 7.5 mg via ORAL
  Filled 2011-07-14: qty 1

## 2011-07-14 MED ORDER — LIDOCAINE HCL 4 % MT SOLN
OROMUCOSAL | Status: DC | PRN
  Administered 2011-07-14: 4 mL via TOPICAL

## 2011-07-14 MED ORDER — GLIMEPIRIDE 1 MG PO TABS
1.0000 mg | ORAL_TABLET | Freq: Every day | ORAL | Status: DC
  Administered 2011-07-15 – 2011-07-17 (×3): 1 mg via ORAL
  Filled 2011-07-14 (×5): qty 1

## 2011-07-14 MED ORDER — CEFAZOLIN SODIUM 1-5 GM-% IV SOLN
INTRAVENOUS | Status: DC | PRN
  Administered 2011-07-14: 2 g via INTRAVENOUS

## 2011-07-14 MED ORDER — MORPHINE SULFATE 2 MG/ML IJ SOLN
2.0000 mg | INTRAMUSCULAR | Status: DC | PRN
  Administered 2011-07-15 – 2011-07-16 (×3): 2 mg via INTRAVENOUS
  Filled 2011-07-14 (×4): qty 1

## 2011-07-14 MED ORDER — ACETAMINOPHEN 650 MG RE SUPP: 650.0000 mg | Freq: Four times a day (QID) | RECTAL | Status: DC | PRN

## 2011-07-14 MED ORDER — OLMESARTAN MEDOXOMIL-HCTZ 40-12.5 MG PO TABS: 1.0000 | ORAL_TABLET | Freq: Every day | ORAL | Status: DC

## 2011-07-14 SURGICAL SUPPLY — 77 items
BANDAGE ELASTIC 4 VELCRO ST LF (GAUZE/BANDAGES/DRESSINGS) ×2 IMPLANT
BANDAGE ELASTIC 6 VELCRO ST LF (GAUZE/BANDAGES/DRESSINGS) ×2 IMPLANT
BANDAGE ESMARK 6X9 LF (GAUZE/BANDAGES/DRESSINGS) ×1 IMPLANT
BLADE SAG 18X100X1.27 (BLADE) ×2 IMPLANT
BLADE SAW SGTL 13.0X1.19X90.0M (BLADE) ×2 IMPLANT
BNDG COHESIVE 6X5 TAN STRL LF (GAUZE/BANDAGES/DRESSINGS) ×2 IMPLANT
BNDG ELASTIC 6X10 VLCR STRL LF (GAUZE/BANDAGES/DRESSINGS) ×6 IMPLANT
BNDG ESMARK 6X9 LF (GAUZE/BANDAGES/DRESSINGS) ×2
BOWL SMART MIX CTS (DISPOSABLE) ×2 IMPLANT
CEMENT HV SMART SET (Cement) ×4 IMPLANT
CLOTH BEACON ORANGE TIMEOUT ST (SAFETY) ×2 IMPLANT
COVER BACK TABLE 24X17X13 BIG (DRAPES) IMPLANT
COVER SURGICAL LIGHT HANDLE (MISCELLANEOUS) ×2 IMPLANT
CUFF TOURNIQUET SINGLE 34IN LL (TOURNIQUET CUFF) IMPLANT
CUFF TOURNIQUET SINGLE 44IN (TOURNIQUET CUFF) IMPLANT
DRAPE INCISE IOBAN 66X45 STRL (DRAPES) IMPLANT
DRAPE ORTHO SPLIT 77X108 STRL (DRAPES) ×3
DRAPE PROXIMA HALF (DRAPES) ×2 IMPLANT
DRAPE SURG ORHT 6 SPLT 77X108 (DRAPES) ×3 IMPLANT
DRAPE U-SHAPE 47X51 STRL (DRAPES) ×2 IMPLANT
DRAPE X-RAY CASS 24X20 (DRAPES) IMPLANT
DRSG PAD ABDOMINAL 8X10 ST (GAUZE/BANDAGES/DRESSINGS) ×2 IMPLANT
DURAPREP 26ML APPLICATOR (WOUND CARE) ×2 IMPLANT
ELECT REM PT RETURN 9FT ADLT (ELECTROSURGICAL) ×2
ELECTRODE REM PT RTRN 9FT ADLT (ELECTROSURGICAL) ×1 IMPLANT
EVACUATOR 1/8 PVC DRAIN (DRAIN) ×2 IMPLANT
FACESHIELD LNG OPTICON STERILE (SAFETY) ×2 IMPLANT
FLUID NSS /IRRIG 3000 ML XXX (IV SOLUTION) ×2 IMPLANT
GAUZE XEROFORM 5X9 LF (GAUZE/BANDAGES/DRESSINGS) ×2 IMPLANT
GLOVE BIO SURGEON ST LM GN SZ9 (GLOVE) ×2 IMPLANT
GLOVE BIOGEL PI IND STRL 7.5 (GLOVE) ×3 IMPLANT
GLOVE BIOGEL PI IND STRL 8 (GLOVE) ×1 IMPLANT
GLOVE BIOGEL PI INDICATOR 7.5 (GLOVE) ×3
GLOVE BIOGEL PI INDICATOR 8 (GLOVE) ×1
GLOVE ECLIPSE 7.0 STRL STRAW (GLOVE) ×4 IMPLANT
GLOVE SURG ORTHO 8.0 STRL STRW (GLOVE) ×2 IMPLANT
GOWN PREVENTION PLUS LG XLONG (DISPOSABLE) IMPLANT
GOWN PREVENTION PLUS XLARGE (GOWN DISPOSABLE) ×2 IMPLANT
GOWN STRL NON-REIN LRG LVL3 (GOWN DISPOSABLE) ×6 IMPLANT
HANDPIECE INTERPULSE COAX TIP (DISPOSABLE) ×1
HOOD PEEL AWAY FACE SHEILD DIS (HOOD) ×8 IMPLANT
IMMOBILIZER KNEE 20 (SOFTGOODS)
IMMOBILIZER KNEE 20 THIGH 36 (SOFTGOODS) IMPLANT
IMMOBILIZER KNEE 22 UNIV (SOFTGOODS) IMPLANT
IMMOBILIZER KNEE 24 THIGH 36 (MISCELLANEOUS) IMPLANT
IMMOBILIZER KNEE 24 UNIV (MISCELLANEOUS)
KIT BASIN OR (CUSTOM PROCEDURE TRAY) ×2 IMPLANT
KIT ROOM TURNOVER OR (KITS) ×2 IMPLANT
MANIFOLD NEPTUNE II (INSTRUMENTS) ×2 IMPLANT
MARKER SPHERE PSV REFLC THRD 5 (MARKER) ×6 IMPLANT
NEEDLE 18GX1X1/2 (RX/OR ONLY) (NEEDLE) ×2 IMPLANT
NEEDLE SPNL 18GX3.5 QUINCKE PK (NEEDLE) ×2 IMPLANT
NS IRRIG 1000ML POUR BTL (IV SOLUTION) ×2 IMPLANT
PACK TOTAL JOINT (CUSTOM PROCEDURE TRAY) ×2 IMPLANT
PAD ARMBOARD 7.5X6 YLW CONV (MISCELLANEOUS) ×4 IMPLANT
PAD CAST 4YDX4 CTTN HI CHSV (CAST SUPPLIES) ×1 IMPLANT
PADDING CAST COTTON 4X4 STRL (CAST SUPPLIES) ×1
PADDING CAST COTTON 6X4 STRL (CAST SUPPLIES) ×2 IMPLANT
PIN SCHANZ 4MM 130MM (PIN) ×8 IMPLANT
RUBBERBAND STERILE (MISCELLANEOUS) ×2 IMPLANT
SET HNDPC FAN SPRY TIP SCT (DISPOSABLE) ×1 IMPLANT
SPONGE GAUZE 4X4 12PLY (GAUZE/BANDAGES/DRESSINGS) ×2 IMPLANT
SPONGE LAP 18X18 X RAY DECT (DISPOSABLE) IMPLANT
STAPLER VISISTAT 35W (STAPLE) ×2 IMPLANT
SUCTION FRAZIER TIP 10 FR DISP (SUCTIONS) ×2 IMPLANT
SUT ETHILON 3 0 PS 1 (SUTURE) ×4 IMPLANT
SUT VIC AB 0 CTB1 27 (SUTURE) ×6 IMPLANT
SUT VIC AB 1 CT1 27 (SUTURE) ×5
SUT VIC AB 1 CT1 27XBRD ANBCTR (SUTURE) ×5 IMPLANT
SUT VIC AB 2-0 CT1 27 (SUTURE) ×2
SUT VIC AB 2-0 CT1 TAPERPNT 27 (SUTURE) ×2 IMPLANT
SYR 30ML SLIP (SYRINGE) ×2 IMPLANT
SYR TB 1ML LUER SLIP (SYRINGE) ×2 IMPLANT
TOWEL OR 17X24 6PK STRL BLUE (TOWEL DISPOSABLE) ×2 IMPLANT
TOWEL OR 17X26 10 PK STRL BLUE (TOWEL DISPOSABLE) ×8 IMPLANT
TRAY FOLEY CATH 14FR (SET/KITS/TRAYS/PACK) ×2 IMPLANT
WATER STERILE IRR 1000ML POUR (IV SOLUTION) ×6 IMPLANT

## 2011-07-14 NOTE — H&P (Signed)
  Pt seen and examined All ? Answered Plan L tka Risks benefits discussed Will proceed

## 2011-07-14 NOTE — Transfer of Care (Signed)
Immediate Anesthesia Transfer of Care Note  Patient: Chelsea Gray  Procedure(s) Performed: Procedure(s) (LRB): COMPUTER ASSISTED TOTAL KNEE ARTHROPLASTY (Left)  Patient Location: PACU  Anesthesia Type: General  Level of Consciousness: awake, alert , oriented and patient cooperative  Airway & Oxygen Therapy: Patient Spontanous Breathing and Patient connected to face mask oxygen  Post-op Assessment: Report given to PACU RN  Post vital signs: Reviewed and stable  Complications: No apparent anesthesia complications

## 2011-07-14 NOTE — Anesthesia Preprocedure Evaluation (Addendum)
Anesthesia Evaluation  Patient identified by MRN, date of birth, ID band Patient awake    Reviewed: Allergy & Precautions, H&P , NPO status , Patient's Chart, lab work & pertinent test results  Airway Mallampati: II TM Distance: >3 FB Neck ROM: Full    Dental No notable dental hx. (+) Teeth Intact and Dental Advisory Given   Pulmonary neg pulmonary ROS,  breath sounds clear to auscultation  Pulmonary exam normal       Cardiovascular hypertension, On Medications Rhythm:Regular Rate:Normal     Neuro/Psych PSYCHIATRIC DISORDERS  Neuromuscular disease    GI/Hepatic negative GI ROS, Neg liver ROS,   Endo/Other  Diabetes mellitus-, Well Controlled, Type 2, Oral Hypoglycemic Agents  Renal/GU negative Renal ROS  negative genitourinary   Musculoskeletal   Abdominal (+) + obese,   Peds  Hematology negative hematology ROS (+)   Anesthesia Other Findings   Reproductive/Obstetrics negative OB ROS                           Anesthesia Physical Anesthesia Plan  ASA: III  Anesthesia Plan: General   Post-op Pain Management:    Induction: Intravenous  Airway Management Planned: Oral ETT  Additional Equipment:   Intra-op Plan:   Post-operative Plan: Extubation in OR  Informed Consent: I have reviewed the patients History and Physical, chart, labs and discussed the procedure including the risks, benefits and alternatives for the proposed anesthesia with the patient or authorized representative who has indicated his/her understanding and acceptance.   Dental advisory given  Plan Discussed with: CRNA, Anesthesiologist and Surgeon  Anesthesia Plan Comments:        Anesthesia Quick Evaluation

## 2011-07-14 NOTE — Preoperative (Signed)
Beta Blockers   Reason not to administer Beta Blockers:Not Applicable 

## 2011-07-14 NOTE — Brief Op Note (Signed)
07/14/2011  11:23 AM  PATIENT:  Chelsea Gray  56 y.o. female  PRE-OPERATIVE DIAGNOSIS:  Left knee osteoarthritis  POST-OPERATIVE DIAGNOSIS:  Left knee osteoarthritis  PROCEDURE:  Procedure(s): COMPUTER ASSISTED TOTAL KNEE ARTHROPLASTY  SURGEON:  Surgeon(s): Cammy Copa, MD  ASSISTANT: Jodene Nam PA  ANESTHESIA:   general  EBL: 100 ml    Total I/O In: 2200 [I.V.:2200] Out: 200 [Urine:200]  BLOOD ADMINISTERED: none  DRAINS: (left) Hemovact drain(s) in the knee with  Suction Clamped   LOCAL MEDICATIONS USED:  none  SPECIMEN:  No Specimen  COUNTS:  YES  TOURNIQUET:   Total Tourniquet Time Documented: Thigh (Left) - 120 minutes  DICTATION: .Other Dictation: Dictation Number [id number]   PLAN OF CARE: Admit to inpatient   PATIENT DISPOSITION:  PACU - hemodynamically stable

## 2011-07-14 NOTE — Plan of Care (Signed)
Problem: Consults Goal: Diagnosis- Total Joint Replacement Outcome: Completed/Met Date Met:  07/14/11 Primary Total Knee

## 2011-07-14 NOTE — Progress Notes (Signed)
Orthopedic Tech Progress Note Patient Details:  Chelsea Gray 1956-03-04 [id number]  CPM Left Knee CPM Left Knee: On Left Knee Flexion (Degrees): 0  Left Knee Extension (Degrees): 30    [name] 07/14/2011, 9:04 PM

## 2011-07-14 NOTE — Progress Notes (Signed)
ANTICOAGULATION CONSULT NOTE - Initial Consult  Pharmacy Consult for coumadin Indication: VTE prophylaxis  Allergies  Allergen Reactions  . Codeine Sulfate Nausea And Vomiting    REACTION: dizziness  . Metformin Diarrhea    REACTION: fatigue, diarrhea    Patient Measurements:  Weight ~94 kg  Vital Signs: Temp: 97.1 F (36.2 C) (04/09 1345) Temp src: Oral (04/09 0605) BP: 150/69 mmHg (04/09 1335) Pulse Rate: 82  (04/09 1345)  Labs: No results found for this basename: HGB:2,HCT:3,PLT:3,APTT:3,LABPROT:3,INR:3,HEPARINUNFRC:3,CREATININE:3,CKTOTAL:3,CKMB:3,TROPONINI:3 in the last 72 hours The CrCl is unknown because both a height and weight (above a minimum accepted value) are required for this calculation.  Medical History: Past Medical History  Diagnosis Date  . DM 01/24/2009  . HYPERCHOLESTEROLEMIA 01/24/2009  . DEPRESSION 01/24/2009  . HYPERTENSION 01/24/2009  . Pulmonary embolism     result of blood transfusion reaction 18 years ago after childbirth  . Blood transfusion     d/t hemmorrhage post c- section  . 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 Osteoarthritis   . History of one miscarriage     Medications:  Prescriptions prior to admission  Medication Sig Dispense Refill  . glimepiride (AMARYL) 1 MG tablet Take 1 mg by mouth daily before breakfast.      . HYDROcodone-acetaminophen (VICODIN) 5-500 MG per tablet Take 1 tablet by mouth every 6 (six) hours as needed.      . Insulin Pen Needle (B-D ULTRAFINE III SHORT PEN) 31G X 8 MM MISC 1 once daily  30 each  11  . Liraglutide 18 MG/3ML SOLN Inject 1.8 mg into the skin every morning.      . olmesartan-hydrochlorothiazide (BENICAR HCT) 40-12.5 MG per tablet Take 1 tablet by mouth daily.      . pioglitazone (ACTOS) 30 MG tablet Take 30 mg by mouth daily.      . simvastatin (ZOCOR) 40 MG tablet Take 40 mg by mouth at bedtime.         Assessment: 56 yo lady to receive  coumadin postop for DVT px.   Goal of Therapy:  INR 2-3   Plan:  Coumadin 7.5 mg today Check daily protime/INR. Coumadin education.  Shakeem Stern Poteet 07/14/2011,3:58 PM

## 2011-07-14 NOTE — Op Note (Signed)
NAMEMarland Kitchen  Chelsea, Gray NO.:  [id number]  MEDICAL RECORD NO.:  [id number]  LOCATION:  5028                         FACILITY:  [facility]  PHYSICIAN:  Burnard Bunting, M.D.    DATE OF BIRTH:  06/17/1955  DATE OF PROCEDURE:  07/14/2011 DATE OF DISCHARGE:                              OPERATIVE REPORT   PREOPERATIVE DIAGNOSIS:  Left knee arthritis.  POSTOPERATIVE DIAGNOSIS:  Left knee arthritis.  PROCEDURE:  Left total knee replacement.  SURGEON:  Burnard Bunting, M.D.  ASSISTANT:  Chelsea Gray, P.A.  ANESTHESIA:  General.  ESTIMATED BLOOD LOSS:  100 mL.  DRAINS:  Hemovac x1.  TOURNIQUET TIME:  118 minutes at 3 mmHg.  INDICATION:  Chelsea Gray is a 56 year old patient with left knee arthritis refractory to nonoperative management, presents for operative management after explanation of risks and benefits.  IMPLANTS:  DePuy rotating platform, 2.5 femur, 2.5 tibia, 12.5 poly spacer, and 32 patella.  PROCEDURE IN DETAIL:  The patient was brought to the operating room where general endotracheal anesthesia was achieved.  Perioperative antibiotics were administered. A time-out was called.  Left leg was then pre-scrubbed with alcohol and Betadine, which allowed to air dry.  Prepped with DuraPrep solution and draped in sterile manner.  Chelsea Gray was used to cover the operative field. The left leg was elevated and exsanguinated with Esmarch wrap. Tourniquet was inflated.  Anterior approach to the knee was made.  Skin and subcutaneous tissues were sharply divided.  The patient had precise location that was marked with a #1 Vicryl suture.  The patella was everted.  Fat-pad was partially excised.  Lateral patellofemoral ligament was released.  The tissue was removed from the anterior distal femur.  Two pins were placed in the proximal medial tibia, proximal and distal medial femur.  Registration points for computer assistance were obtained including the bimalleolar axis,  hip center, rotation, and various points around the knee.  With the collaterals and posterior neurovascular structures protected, a tibial cut was made approximately at 9 mm of the high side and 7 mm off the low side.  The tensioning device was then placed in extension and flexion.  A distal femoral cut was then made initially at 8.7 mm, later revised 10.7 mm.  With the revised cut, the box cut and chamfer cuts were made.  The patient achieved full extension with a 10-spacer.  At this time, tibia was keel punched.  Patella was prepared freehand from 23 mm patella down to 13 mm patella.  Trials were placed.  The patient had about 3 degrees of hyperextension with a 10-spacer and had excellent stability with varus- valgus stress at 0-39 degrees with good patellar tracking using no thumbs technique.  At this time, trial components were removed.  3 L of irrigating solution were placed through the knee.  The true components were then cemented into position.  A 12.5 poly spacer was utilized because it gave excellent coronal alignment and full extension. Tourniquet was released.  Bleeding points were encountered and controlled with electrocautery.  Hemovac drain was placed.  The incision was then closed over bolster using #1 Vicryl suture, 0 Vicryl suture, 2- 0 Vicryl suture,  and skin staples.  The incisions for the pin sites were irrigated and closed using 3-0 nylon suture.  Solution of Marcaine and morphine finally injected to the knee.  Bulky dressing and knee immobilizer was placed.  Chelsea Gray's assistance was required in all times during the case for opening, closing, retraction of neurovascular structures, mobilization and limb positioning.  Her assistance was a medical necessity.     Burnard Bunting, M.D.     GSD/MEDQ  D:  07/14/2011  T:  07/14/2011  Job:  [id number]

## 2011-07-14 NOTE — Anesthesia Postprocedure Evaluation (Signed)
  Anesthesia Post-op Note  Patient: Chelsea Gray  Procedure(s) Performed: Procedure(s) (LRB): COMPUTER ASSISTED TOTAL KNEE ARTHROPLASTY (Left)  Patient Location: PACU  Anesthesia Type: General  Level of Consciousness: awake and alert   Airway and Oxygen Therapy: Patient Spontanous Breathing and Patient connected to nasal cannula oxygen  Post-op Pain: moderate  Post-op Assessment: Post-op Vital signs reviewed, Patient's Cardiovascular Status Stable, Respiratory Function Stable, Patent Airway and No signs of Nausea or vomiting  Post-op Vital Signs: Reviewed and stable  Complications: No apparent anesthesia complications

## 2011-07-14 NOTE — Anesthesia Procedure Notes (Signed)
Procedure Name: Intubation Date/Time: 07/14/2011 8:15 AM Performed by: Glendora Score A Pre-anesthesia Checklist: Patient identified, Emergency Drugs available, Suction available and Patient being monitored Patient Re-evaluated:Patient Re-evaluated prior to inductionOxygen Delivery Method: Circle system utilized Preoxygenation: Pre-oxygenation with 100% oxygen Intubation Type: IV induction Ventilation: Mask ventilation without difficulty and Oral airway inserted - appropriate to patient size Laryngoscope Size: Hyacinth Meeker and 2 Grade View: Grade I Tube type: Oral Tube size: 7.0 mm Number of attempts: 1 Airway Equipment and Method: Stylet and LTA kit utilized Placement Confirmation: ETT inserted through vocal cords under direct vision,  positive ETCO2 and breath sounds checked- equal and bilateral Secured at: 22 cm Tube secured with: Tape Dental Injury: Teeth and Oropharynx as per pre-operative assessment

## 2011-07-15 ENCOUNTER — Encounter (HOSPITAL_COMMUNITY): Payer: Self-pay | Admitting: Orthopedic Surgery

## 2011-07-15 LAB — BASIC METABOLIC PANEL
BUN: 13 mg/dL (ref 6–23)
CO2: 24 mEq/L (ref 19–32)
Chloride: 101 mEq/L (ref 96–112)
Glucose, Bld: 148 mg/dL — ABNORMAL HIGH (ref 70–99)
Potassium: 4.1 mEq/L (ref 3.5–5.1)
Sodium: 136 mEq/L (ref 135–145)

## 2011-07-15 LAB — GLUCOSE, CAPILLARY
Glucose-Capillary: 132 mg/dL — ABNORMAL HIGH (ref 70–99)
Glucose-Capillary: 148 mg/dL — ABNORMAL HIGH (ref 70–99)
Glucose-Capillary: 149 mg/dL — ABNORMAL HIGH (ref 70–99)
Glucose-Capillary: 155 mg/dL — ABNORMAL HIGH (ref 70–99)

## 2011-07-15 LAB — CBC
HCT: 35 % — ABNORMAL LOW (ref 36.0–46.0)
Hemoglobin: 11.7 g/dL — ABNORMAL LOW (ref 12.0–15.0)
RBC: 3.84 MIL/uL — ABNORMAL LOW (ref 3.87–5.11)

## 2011-07-15 LAB — PROTIME-INR
INR: 1.03 (ref 0.00–1.49)
Prothrombin Time: 13.7 seconds (ref 11.6–15.2)

## 2011-07-15 MED ORDER — WARFARIN SODIUM 7.5 MG PO TABS
7.5000 mg | ORAL_TABLET | Freq: Once | ORAL | Status: AC
  Administered 2011-07-15: 7.5 mg via ORAL
  Filled 2011-07-15: qty 1

## 2011-07-15 MED ORDER — PROMETHAZINE HCL 25 MG PO TABS
25.0000 mg | ORAL_TABLET | Freq: Four times a day (QID) | ORAL | Status: DC | PRN
  Administered 2011-07-15: 25 mg via ORAL
  Filled 2011-07-15: qty 1

## 2011-07-15 MED ORDER — HYDROMORPHONE 0.3 MG/ML IV SOLN
INTRAVENOUS | Status: DC
  Administered 2011-07-15: 5.1 mg via INTRAVENOUS
  Administered 2011-07-15: 3 mg via INTRAVENOUS

## 2011-07-15 MED ORDER — HYDROMORPHONE 0.3 MG/ML IV SOLN
INTRAVENOUS | Status: AC
  Administered 2011-07-15: 06:00:00
  Filled 2011-07-15: qty 25

## 2011-07-15 MED FILL — Morphine Sulfate Inj 4 MG/ML: INTRAMUSCULAR | Qty: 2 | Status: AC

## 2011-07-15 MED FILL — Hydromorphone HCl Preservative Free (PF) Inj 1 MG/ML: INTRAMUSCULAR | Qty: 1 | Status: AC

## 2011-07-15 NOTE — Progress Notes (Signed)
Physical Therapy Evaluation Note  Past Medical History  Diagnosis Date  . DM 01/24/2009  . HYPERCHOLESTEROLEMIA 01/24/2009  . DEPRESSION 01/24/2009  . HYPERTENSION 01/24/2009  . Pulmonary embolism     result of blood transfusion reaction 18 years ago after childbirth  . Blood transfusion     d/t hemmorrhage post c- section  . 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 Osteoarthritis   . History of one miscarriage    Past Surgical History  Procedure Date  . Cesarean section   . Cervical cerclage   . Breast reduction surgery   . Breast cyst excision      07/15/11 1100  PT Visit Information  Last PT Received On 07/15/11  Precautions  Precautions Knee  Required Braces or Orthoses Yes  Knee Immobilizer On at all times  Restrictions  Weight Bearing Restrictions Yes  LLE Weight Bearing WBAT  Home Living  Lives With Spouse;Daughter  Type of Home House  Home Layout One level  Home Access Stairs to enter  Entrance Stairs-Rails Left  Entrance Stairs-Number of Steps 4  Bathroom Shower/Tub Tub/shower unit;Walk-in Pension scheme manager Yes  How Accessible Accessible via walker  Home Adaptive Equipment Bedside commode/3-in-1;Walker - rolling  Prior Function  Level of Independence Independent with basic ADLs;Independent with gait;Independent with transfers  Able to Take Stairs? Yes  Driving Yes  Vocation Full time employment  Vocation Requirements Receptionist  Cognition  Arousal/Alertness Awake/alert  Overall Cognitive Status Appears within functional limits for tasks assessed  Orientation Level Oriented X4  Sensation  Light Touch Appears Intact  Bed Mobility  Supine to Sit 3: Mod assist  Supine to Sit Details (indicate cue type and reason) assist for L LE and strong use of bed rail. HOB at 30 deg  Transfers  Sit to Stand 4: Min assist  Sit to Stand Details (indicate cue type and reason)  v/c's for hand placement, L underarm assist to achieve full upright pattern  Ambulation/Gait  Ambulation/Gait Yes  Ambulation/Gait Assistance 4: Min assist (contact guard)  Ambulation/Gait Assistance Details (indicate cue type and reason) tolerated no KI very well and had no episodes of L knee buckling  Ambulation Distance (Feet) 40 Feet  Assistive device Rolling walker  Gait Pattern Step-to pattern;Decreased step length - left;Decreased stance time - left;Antalgic  Gait velocity decreased but WFL for surgery  Stairs No  Wheelchair Mobility  Wheelchair Mobility No  Posture/Postural Control  Posture/Postural Control No significant limitations  Balance  Balance Assessed No  RUE Assessment  RUE Assessment WFL  LUE Assessment  LUE Assessment WFL  RLE Assessment  RLE Assessment WFL  LLE Assessment  LLE Assessment X (pt with good L quad set, ankle/hip [facility])  Cervical Assessment  Cervical Assessment [facility]  Thoracic Assessment  Thoracic Assessment WFL  Lumbar Assessment  Lumbar Assessment WFL  Exercises  Exercises Total Joint (provided hand out for HEP)  Total Joint Exercises  Ankle Circles/Pumps AROM;10 reps;Supine;Both  Quad Sets AROM;Left;10 reps;Supine  Knee Flexion AAROM;Left;10 reps;Supine (achieved approx 30 degrees of L knee flexion)  PT - End of Session  Equipment Utilized During Treatment Gait belt  Activity Tolerance Patient tolerated treatment well  Patient left in chair;with call bell in reach  Nurse Communication Mobility status for transfers;Mobility status for ambulation  CPM Left Knee  CPM Left Knee Off  General  Behavior During Session [facility] for tasks performed  Cognition [facility] for  tasks performed  PT Assessment  Clinical Impression Statement Pt s/p L TKA presenting with decreased L LE strength and L knee ROM. patient very motivated and doing very well. Suspect pt to be able to return home with home health and intermittent assist in addition to recommended DME  upon d/c.  PT Recommendation/Assessment Patient will need skilled PT in the acute care venue  PT Problem List Decreased strength;Decreased range of motion;Decreased activity tolerance;Decreased balance;Decreased mobility  Barriers to Discharge None  PT Therapy Diagnosis  Difficulty walking;Abnormality of gait;Generalized weakness;Acute pain  PT Plan  PT Frequency 7X/week  PT Treatment/Interventions DME instruction;Gait training;Stair training;Functional mobility training;Therapeutic activities;Therapeutic exercise  PT Recommendation  Follow Up Recommendations Home health PT;Supervision - Intermittent  Equipment Recommended None recommended by PT (pt reports to have RW and BSC)  Individuals Consulted  Consulted and Agree with Results and Recommendations Patient  Acute Rehab PT Goals  PT Goal Formulation With patient  Time For Goal Achievement 7 days  Pt will go Supine/Side to Sit with modified independence;with HOB 0 degrees  PT Goal: Supine/Side to Sit - Progress Goal set today  Pt will go Sit to Stand with modified independence (up to RW)  PT Goal: Sit to Stand - Progress Goal set today  Pt will Ambulate >150 feet;with modified independence;with rolling walker  PT Goal: Ambulate - Progress Goal set today  Pt will Go Up / Down Stairs 3-5 stairs;with min assist;with rail(s) (or with RW backwarkds.)  PT Goal: Up/Down Stairs - Progress Goal set today  Pt will Perform Home Exercise Program Independently  PT Goal: Perform Home Exercise Program - Progress Goal set today    Pain: 7/10 L knee pain, pt pushed PCA during PT eval  Lewis Shock, PT, DPT Pager #: [phone] Office #: [phone]

## 2011-07-15 NOTE — Progress Notes (Signed)
ANTICOAGULATION CONSULT NOTE - Initial Consult  Pharmacy Consult for coumadin Indication: VTE prophylaxis  Allergies  Allergen Reactions  . Codeine Sulfate Nausea And Vomiting    REACTION: dizziness  . Metformin Diarrhea    REACTION: fatigue, diarrhea    Patient Measurements:  Weight ~94 kg  Vital Signs: Temp: 98.4 F (36.9 C) (04/10 0640) BP: 108/68 mmHg (04/10 1100) Pulse Rate: 101  (04/10 0640)  Labs:  Basename 07/15/11 0620  HGB 11.7*  HCT 35.0*  PLT 264  APTT --  LABPROT 13.7  INR 1.03  HEPARINUNFRC --  CREATININE 0.62  CKTOTAL --  CKMB --  TROPONINI --   The CrCl is unknown because both a height and weight (above a minimum accepted value) are required for this calculation.  Medical History: Past Medical History  Diagnosis Date  . DM 01/24/2009  . HYPERCHOLESTEROLEMIA 01/24/2009  . DEPRESSION 01/24/2009  . HYPERTENSION 01/24/2009  . Pulmonary embolism     result of blood transfusion reaction 18 years ago after childbirth  . Blood transfusion     d/t hemmorrhage post c- section  . 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 Osteoarthritis   . History of one miscarriage     Medications:  Prescriptions prior to admission  Medication Sig Dispense Refill  . glimepiride (AMARYL) 1 MG tablet Take 1 mg by mouth daily before breakfast.      . HYDROcodone-acetaminophen (VICODIN) 5-500 MG per tablet Take 1 tablet by mouth every 6 (six) hours as needed.      . Insulin Pen Needle (B-D ULTRAFINE III SHORT PEN) 31G X 8 MM MISC 1 once daily  30 each  11  . Liraglutide 18 MG/3ML SOLN Inject 1.8 mg into the skin every morning.      . olmesartan-hydrochlorothiazide (BENICAR HCT) 40-12.5 MG per tablet Take 1 tablet by mouth daily.      . pioglitazone (ACTOS) 30 MG tablet Take 30 mg by mouth daily.      . simvastatin (ZOCOR) 40 MG tablet Take 40 mg by mouth at bedtime.         Assessment: 56 y/o female patient s/p   L  TKA receiving coumadin for dvt px. Coumadin started last pm , no bleeding reported.  Goal of Therapy:  INR 2-3   Plan:  Repeat coumadin 7.5mg  today and f/u in am.  Verlene Mayer, PharmD, BCPS Pager [phone] 07/15/2011,12:31 PM

## 2011-07-15 NOTE — Progress Notes (Signed)
Clinical Social Worker received a consult for SNF. Currently, PT is recommending home health services. RNCM is aware and will follow. CSW is signing off as no social work discharge needs are identified. Please reconsult if a need arises prior to discharge.   Dede Query, MSW, Theresia Majors [phone]

## 2011-07-15 NOTE — Progress Notes (Signed)
CARE MANAGEMENT NOTE 07/15/2011  Patient:  RAJVI, ARMENTOR   Account Number:  [id number]  Date Initiated:  07/15/2011  Documentation initiated by:  Vance Peper  Subjective/Objective Assessment:   56 yr old female s/p left total knee arthroplasty     Action/Plan:   Spoke with patient regarding Home Health needs. Choice was offered. Rolling walker, 3in1 and CPM have been delivered to patient's home.   Anticipated DC Date:  07/17/2011   Anticipated DC Plan:  HOME W HOME HEALTH SERVICES      DC Planning Services  CM consult      [facility] Choice  HOME HEALTH   Choice offered to / List presented to:  C-1 Patient   DME arranged  NA      DME agency  NA     HH arranged  HH-2 PT  HH-1 RN      [facility] agency  [facility]   Status of service:  Completed, signed off  Discharge Disposition:  HOME W HOME HEALTH SERVICES

## 2011-07-15 NOTE — Evaluation (Signed)
Occupational Therapy Evaluation Patient Details Name: Chelsea Gray MRN: [id number] DOB: 09/29/1955 Today's Date: 07/15/2011  Problem List:  Patient Active Problem List  Diagnoses  . DM  . HYPERCHOLESTEROLEMIA  . SMOKER  . DEPRESSION  . HYPERTENSION  . MYALGIA  . NUMBNESS  . CHEST PAIN  . Abdominal pain, unspecified site    Past Medical History:  Past Medical History  Diagnosis Date  . DM 01/24/2009  . HYPERCHOLESTEROLEMIA 01/24/2009  . DEPRESSION 01/24/2009  . HYPERTENSION 01/24/2009  . Pulmonary embolism     result of blood transfusion reaction 18 years ago after childbirth  . Blood transfusion     d/t hemmorrhage post c- section  . 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 Osteoarthritis   . History of one miscarriage    Past Surgical History:  Past Surgical History  Procedure Date  . Cesarean section   . Cervical cerclage   . Breast reduction surgery   . Breast cyst excision     OT Assessment/Plan/Recommendation OT Assessment Clinical Impression Statement: Pt. presents s/p left TKA and with increased pain. Pt. will benefit from skilled OT to increase functional independence with ADLs and get pt. to supervision level at D/C  OT Recommendation/Assessment: Patient will need skilled OT in the acute care venue OT Problem List: Decreased activity tolerance;Impaired balance (sitting and/or standing);Decreased safety awareness;Decreased knowledge of use of DME or AE;Decreased knowledge of precautions;Pain Barriers to Discharge: None OT Therapy Diagnosis : Acute pain OT Plan OT Frequency: Min 2X/week OT Treatment/Interventions: Self-care/ADL training;DME and/or AE instruction;Therapeutic activities;Patient/family education;Balance training OT Recommendation Follow Up Recommendations: No OT follow up Equipment Recommended: None recommended by OT (pt reports to have RW and BSC) Individuals Consulted Consulted and Agree  with Results and Recommendations: Patient OT Goals Acute Rehab OT Goals OT Goal Formulation: With patient Time For Goal Achievement: 7 days ADL Goals Pt Will Perform Lower Body Bathing: with set-up;with supervision;Sit to stand from chair;with adaptive equipment ADL Goal: Lower Body Bathing - Progress: Goal set today Pt Will Perform Lower Body Dressing: with set-up;with supervision;Sit to stand from chair;with adaptive equipment ADL Goal: Lower Body Dressing - Progress: Goal set today Pt Will Transfer to Toilet: with supervision;with DME;Ambulation;3-in-1 ADL Goal: Toilet Transfer - Progress: Goal set today Pt Will Perform Tub/Shower Transfer: Shower transfer;with supervision;with DME;Ambulation;Shower seat with back ADL Goal: Web designer - Progress: Goal set today  OT Evaluation Precautions/Restrictions  Precautions Precautions: Knee Required Braces or Orthoses: Yes Knee Immobilizer: On at all times Restrictions Weight Bearing Restrictions: Yes LLE Weight Bearing: Weight bearing as tolerated Prior Functioning Home Living Lives With: Spouse;Daughter Type of Home: House Home Layout: One level Home Access: Stairs to enter Entrance Stairs-Rails: Left Entrance Stairs-Number of Steps: 4 Bathroom Shower/Tub: Tub/shower unit;Walk-in shower Bathroom Toilet: Standard Bathroom Accessibility: Yes How Accessible: Accessible via walker Home Adaptive Equipment: Bedside commode/3-in-1;Walker - rolling Prior Function Level of Independence: Independent with basic ADLs;Independent with gait;Independent with transfers Able to Take Stairs?: Yes Driving: Yes Vocation: Full time employment Vocation Requirements: Receptionist Comments: Pt. has two dogs corgi's named Zoe and Chloe ADL ADL Eating/Feeding: Simulated;Independent Where Assessed - Eating/Feeding: Chair Grooming: Simulated;Wash/dry face;Set up;Minimal assistance Grooming Details (indicate cue type and reason): Assist for  balance Where Assessed - Grooming: Standing at sink Upper Body Bathing: Simulated;Chest;Right arm;Left arm;Abdomen;Set up Where Assessed - Upper Body Bathing: Sitting, bed Lower Body Bathing: Simulated;Maximal assistance Where Assessed - Lower Body Bathing: Sit to stand  from bed Upper Body Dressing: Performed;Minimal assistance Upper Body Dressing Details (indicate cue type and reason): With donning gown Where Assessed - Upper Body Dressing: Sitting, bed Lower Body Dressing: Simulated;Maximal assistance Where Assessed - Lower Body Dressing: Sit to stand from bed Toilet Transfer: Simulated;Minimal assistance Toilet Transfer Method: Ambulating Toilet Transfer Equipment: Other (comment) Nurse, children's) Toileting - Clothing Manipulation: Simulated;Minimal assistance Where Assessed - Toileting Clothing Manipulation: Sit to stand from 3-in-1 or toilet Toileting - Hygiene: Simulated;Minimal assistance Where Assessed - Toileting Hygiene: Sit to stand from 3-in-1 or toilet Tub/Shower Transfer: Not assessed Tub/Shower Transfer Method: Not assessed Equipment Used: Rolling walker Ambulation Related to ADLs: Pt. min assist ~12' with RW due to decreased balance and increased pain level ADL Comments: Pt. educated on use of AE for completing LB ADLs and educated on shower transfer technique. Cognition Cognition Arousal/Alertness: Awake/alert Overall Cognitive Status: Appears within functional limits for tasks assessed Orientation Level: Oriented X4 Sensation/Coordination Sensation Light Touch: Appears Intact Extremity Assessment RUE Assessment RUE Assessment: Within Functional Limits LUE Assessment LUE Assessment: Within Functional Limits Mobility  Bed Mobility Bed Mobility: Yes Supine to Sit: 3: Mod assist Supine to Sit Details (indicate cue type and reason): assist for L LE and strong use of bed rail. HOB at 30 deg Transfers Transfers: Yes Sit to Stand: 4: Min assist Sit to Stand Details  (indicate cue type and reason): v/c's for hand placement, L underarm assist to achieve full upright pattern   End of Session OT - End of Session Equipment Utilized During Treatment: Gait belt Activity Tolerance: Patient tolerated treatment well Patient left: Other (comment) (ambulating with P.T.) Nurse Communication: Mobility status for transfers General Behavior During Session: [facility] for tasks performed Cognition: [facility] for tasks performed   Jamey Demchak, OTR/L Pager [phone] 07/15/2011, 12:55 PM

## 2011-07-15 NOTE — Progress Notes (Signed)
Physical Therapy Treatment Note   07/15/11 1434  PT Visit Information  Last PT Received On 07/15/11  Precautions  Precautions Knee  Restrictions  LLE Weight Bearing WBAT  Bed Mobility  Bed Mobility Yes  Sit to Supine 4: Min assist  Sit to Supine - Details (indicate cue type and reason) assist for L LE management, v/cs for sequencing  Transfers  Sit to Stand 4: Min assist  Ambulation/Gait  Ambulation/Gait Assistance 4: Min assist  Ambulation Distance (Feet) 60 Feet  Assistive device Rolling walker  Gait Pattern Step-to pattern;Decreased step length - left;Decreased stance time - left  Gait velocity improved from AM session  Stairs No  Exercises  Exercises Total Joint  Total Joint Exercises  Heel Slides AROM;AAROM;Left;20 reps;Seated (10 AROM, 10 AAROM, achieved 75 deg AA knee flex)  PT - End of Session  Equipment Utilized During Treatment Gait belt  Activity Tolerance Patient tolerated treatment well  Patient left in bed;in CPM;with call bell in reach (CPM set 0-50 deg)  Nurse Communication Mobility status for transfers;Mobility status for ambulation  General  Behavior During Session [facility] for tasks performed  Cognition [facility] for tasks performed  PT - Assessment/Plan  Comments on Treatment Session Patient with improved ambulation endurance this date. Patient con't to rest with L LE in external rotation. educated pt on importance of maintaining L knee pointed up towards ceiling to assist with stretching posterior knee. pt agreed.  PT Plan Discharge plan remains appropriate;Frequency remains appropriate  PT Frequency 7X/week  Follow Up Recommendations Home health PT;Supervision/Assistance - 24 hour  Equipment Recommended None recommended by PT  Acute Rehab PT Goals  PT Goal: Supine/Side to Sit - Progress Progressing toward goal  PT Goal: Sit to Stand - Progress Progressing toward goal  PT Goal: Ambulate - Progress Progressing toward goal  PT Goal: Perform Home Exercise Program  - Progress Progressing toward goal    Pain: pt did not rate pain. Pt reports increased pain with L knee flexion however reports pain to be "it's really not that bad."  Lewis Shock, PT, DPT Pager #: [phone] Office #: [phone]

## 2011-07-15 NOTE — Progress Notes (Signed)
UR COMPLETED  

## 2011-07-15 NOTE — Discharge Instructions (Signed)
Home Health will be provided by [facility] [phone]

## 2011-07-15 NOTE — Progress Notes (Signed)
Subjective: Pt stable pain controlled   Objective: Vital signs in last 24 hours: Temp:  [97.1 F (36.2 C)-98.5 F (36.9 C)] 98.4 F (36.9 C) (04/10 0640) Pulse Rate:  [71-101] 101  (04/10 0640) Resp:  [10-25] 16  (04/10 0640) BP: (100-164)/(58-96) 119/96 mmHg (04/10 0640) SpO2:  [95 %-100 %] 100 % (04/10 0640)  Intake/Output from previous day: 04/09 0701 - 04/10 0700 In: 3162.5 [I.V.:3062.5; IV Piggyback:100] Out: 350 [Urine:350] Intake/Output this shift:    Exam:  Sensation intact distally Intact pulses distally Dorsiflexion/Plantar flexion intact  Labs:  Basename 07/15/11 0620  HGB 11.7*    Basename 07/15/11 0620  WBC 11.5*  RBC 3.84*  HCT 35.0*  PLT 264    Basename 07/15/11 0620  NA 136  K 4.1  CL 101  CO2 24  BUN 13  CREATININE 0.62  GLUCOSE 148*  CALCIUM 8.9    Basename 07/15/11 0620  LABPT --  INR 1.03    Assessment/Plan: Pt stable - vss - mobilize with PT - cpm today - dc pca at noon and start oral pain meds   Breean Nannini SCOTT 07/15/2011, 7:40 AM

## 2011-07-16 LAB — CBC
HCT: 31 % — ABNORMAL LOW (ref 36.0–46.0)
Hemoglobin: 10.2 g/dL — ABNORMAL LOW (ref 12.0–15.0)
MCH: 29.7 pg (ref 26.0–34.0)
MCV: 90.1 fL (ref 78.0–100.0)
RBC: 3.44 MIL/uL — ABNORMAL LOW (ref 3.87–5.11)

## 2011-07-16 LAB — GLUCOSE, CAPILLARY
Glucose-Capillary: 132 mg/dL — ABNORMAL HIGH (ref 70–99)
Glucose-Capillary: 159 mg/dL — ABNORMAL HIGH (ref 70–99)
Glucose-Capillary: 105 mg/dL — ABNORMAL HIGH (ref 70–99)

## 2011-07-16 MED ORDER — HYDROCODONE-ACETAMINOPHEN 10-325 MG PO TABS: 1.0000 | ORAL_TABLET | ORAL | Status: AC | PRN

## 2011-07-16 MED ORDER — WARFARIN SODIUM 2.5 MG PO TABS: 2.5000 mg | ORAL_TABLET | Freq: Once | ORAL | Status: DC

## 2011-07-16 MED ORDER — METHOCARBAMOL 500 MG PO TABS: 500.0000 mg | ORAL_TABLET | Freq: Four times a day (QID) | ORAL | Status: AC | PRN

## 2011-07-16 MED ORDER — WARFARIN SODIUM 2.5 MG PO TABS
2.5000 mg | ORAL_TABLET | Freq: Once | ORAL | Status: AC
  Administered 2011-07-16: 2.5 mg via ORAL
  Filled 2011-07-16: qty 1

## 2011-07-16 NOTE — Progress Notes (Signed)
Physical Therapy Treatment Note   07/16/11 1136  PT Visit Information  Last PT Received On 07/16/11  Precautions  Precautions Knee  Restrictions  LLE Weight Bearing WBAT  Bed Mobility  Bed Mobility Yes  Supine to Sit 4: Min assist  Supine to Sit Details (indicate cue type and reason) assist foror L LE only, no use of hand rail  Transfers  Sit to Stand 5: Supervision  Ambulation/Gait  Ambulation/Gait Assistance 4: Min assist (contact guard)  Ambulation/Gait Assistance Details (indicate cue type and reason) increased ability to put weight through L LE without L knee buckling   Ambulation Distance (Feet) 100 Feet (x2, to/from gym)  Assistive device Rolling walker  Gait Pattern Step-to pattern;Decreased step length - left;Decreased stance time - left  Gait velocity improved  Stairs Yes  Stairs Assistance 3: Mod assist (via HHA)  Stairs Assistance Details (indicate cue type and reason) R HHA due to handrail only on L  Stair Management Technique One rail Left (R HAA)  Number of Stairs 2   PT - End of Session  Equipment Utilized During Treatment Gait belt  Activity Tolerance Patient tolerated treatment well  Patient left in chair;with call bell in reach  Nurse Communication Mobility status for transfers;Mobility status for ambulation  General  Behavior During Session [facility] for tasks performed  Cognition [facility] for tasks performed  PT - Assessment/Plan  Comments on Treatment Session Patient with improved L LE ROM and strength. Patient with good understanding and return demonstration of stair negotiation technique. Patient safe to return home with spouse. Will practice stairs with spouse tomorrow AM.  PT Plan Discharge plan remains appropriate;Frequency remains appropriate  PT Frequency 7X/week  Follow Up Recommendations Home health PT;Supervision - Intermittent  Equipment Recommended None recommended by PT  Acute Rehab PT Goals  PT Goal: Supine/Side to Sit - Progress Progressing  toward goal  PT Goal: Sit to Stand - Progress Progressing toward goal  PT Goal: Ambulate - Progress Progressing toward goal  PT Goal: Up/Down Stairs - Progress Progressing toward goal  PT Goal: Perform Home Exercise Program - Progress Progressing toward goal   Patient received supine in CPM. Pt reports being in CPM for 2 hours.   Pain: 6/10 L knee pain  Lewis Shock, PT, DPT Pager #: [phone] Office #: [phone]

## 2011-07-16 NOTE — Progress Notes (Signed)
Occupational Therapy Treatment Patient Details Name: PANDORA MCCRACKIN MRN: [id number] DOB: 1955/09/21 Today's Date: 07/16/2011  OT Assessment/Plan OT Assessment/Plan Comments on Treatment Session: Pt. progressing well today with therapy OT Plan: Discharge plan remains appropriate OT Frequency: Min 2X/week Follow Up Recommendations: No OT follow up Equipment Recommended: None recommended by OT OT Goals Acute Rehab OT Goals OT Goal Formulation: With patient Time For Goal Achievement: 7 days ADL Goals Pt Will Transfer to Toilet: with supervision;with DME;Ambulation;3-in-1 ADL Goal: Toilet Transfer - Progress: Met Pt Will Perform Tub/Shower Transfer: Shower transfer;with supervision;with DME;Ambulation;Shower seat with back ADL Goal: Web designer - Progress: Progressing toward goals  OT Treatment Precautions/Restrictions  Precautions Precautions: Knee Required Braces or Orthoses: Yes Restrictions Weight Bearing Restrictions: Yes LLE Weight Bearing: Weight bearing as tolerated   ADL ADL Grooming: Performed;Wash/dry hands;Wash/dry face;Set up;Supervision/safety Grooming Details (indicate cue type and reason): With RW Where Assessed - Grooming: Standing at sink Toilet Transfer: Research scientist (life sciences) Details (indicate cue type and reason): Min verbal cues for hand placement on 3-in-1 and RW and also for left LE placement on small stool while sitting on commoce Toilet Transfer Method: Proofreader: Raised toilet seat with arms (or 3-in-1 over toilet) Toileting - Hygiene: Performed;Set up Where Assessed - Toileting Hygiene: Sit on 3-in-1 or toilet Tub/Shower Transfer: Simulated;Minimal assistance Tub/Shower Transfer Details (indicate cue type and reason): Min verbal cues for technique of stepping over threshold with strong leg and hand placement on RW Tub/Shower Transfer Method: Other (comment) (posterior entrance) Tub/Shower  Transfer Equipment: Walk in shower Equipment Used: Rolling walker Ambulation Related to ADLs: Pt. supervision ~20' ADL Comments: Pt. reports family will assist with LB ADLs at home Mobility  Bed Mobility Bed Mobility: Yes Supine to Sit: 4: Min assist Supine to Sit Details (indicate cue type and reason): For left LE scooting Transfers Transfers: Yes Sit to Stand: 5: Supervision;With upper extremity assist;From bed Sit to Stand Details (indicate cue type and reason): Min verbal cues for hand placement      End of Session OT - End of Session Equipment Utilized During Treatment: Gait belt Activity Tolerance: Patient tolerated treatment well Patient left: in chair;with call bell in reach Nurse Communication: Mobility status for transfers General Behavior During Session: [facility] for tasks performed Cognition: [facility] for tasks performed  Samanthan Dugo, OTR/L Pager [phone]  07/16/2011, 9:10 AM

## 2011-07-16 NOTE — Progress Notes (Signed)
Subjective: Pt stable   Pain controlled   Objective: Vital signs in last 24 hours: Temp:  [98.8 F (37.1 C)-99.4 F (37.4 C)] 98.8 F (37.1 C) (04/11 0647) Pulse Rate:  [94-98] 98  (04/11 0647) Resp:  [18] 18  (04/11 0647) BP: (93-108)/(55-74) 108/74 mmHg (04/11 0647) SpO2:  [92 %-96 %] 96 % (04/11 0647)  Intake/Output from previous day: 04/10 0701 - 04/11 0700 In: 1965 [P.O.:1080; I.V.:885] Out: 250 [Urine:250] Intake/Output this shift:    Exam:  Neurovascular intact Sensation intact distally Intact pulses distally  Labs:  Basename 07/16/11 0640 07/15/11 0620  HGB 10.2* 11.7*    Basename 07/16/11 0640 07/15/11 0620  WBC 13.0* 11.5*  RBC 3.44* 3.84*  HCT 31.0* 35.0*  PLT 249 264    Basename 07/15/11 0620  NA 136  K 4.1  CL 101  CO2 24  BUN 13  CREATININE 0.62  GLUCOSE 148*  CALCIUM 8.9    Basename 07/16/11 0640 07/15/11 0620  LABPT -- --  INR 1.95* 1.03    Assessment/Plan: Pt doing well  Probable dc am   inr near therapeutic   Jodean Valade SCOTT 07/16/2011, 2:07 PM

## 2011-07-16 NOTE — Progress Notes (Signed)
ANTICOAGULATION CONSULT NOTE - Initial Consult  Pharmacy Consult for coumadin Indication: VTE prophylaxis  Allergies  Allergen Reactions  . Codeine Sulfate Nausea And Vomiting    REACTION: dizziness  . Metformin Diarrhea    REACTION: fatigue, diarrhea    Patient Measurements:  Weight ~94 kg  Vital Signs: Temp: 98.8 F (37.1 C) (04/11 0647) BP: 108/74 mmHg (04/11 0647) Pulse Rate: 98  (04/11 0647)  Labs:  Basename 07/16/11 0640 07/15/11 0620  HGB 10.2* 11.7*  HCT 31.0* 35.0*  PLT 249 264  APTT -- --  LABPROT 22.6* 13.7  INR 1.95* 1.03  HEPARINUNFRC -- --  CREATININE -- 0.62  CKTOTAL -- --  CKMB -- --  TROPONINI -- --   The CrCl is unknown because both a height and weight (above a minimum accepted value) are required for this calculation.  Medical History: Past Medical History  Diagnosis Date  . DM 01/24/2009  . HYPERCHOLESTEROLEMIA 01/24/2009  . DEPRESSION 01/24/2009  . HYPERTENSION 01/24/2009  . Pulmonary embolism     result of blood transfusion reaction 18 years ago after childbirth  . Blood transfusion     d/t hemmorrhage post c- section  . 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 Osteoarthritis   . History of one miscarriage     Medications:  Prescriptions prior to admission  Medication Sig Dispense Refill  . glimepiride (AMARYL) 1 MG tablet Take 1 mg by mouth daily before breakfast.      . HYDROcodone-acetaminophen (VICODIN) 5-500 MG per tablet Take 1 tablet by mouth every 6 (six) hours as needed.      . Insulin Pen Needle (B-D ULTRAFINE III SHORT PEN) 31G X 8 MM MISC 1 once daily  30 each  11  . Liraglutide 18 MG/3ML SOLN Inject 1.8 mg into the skin every morning.      . olmesartan-hydrochlorothiazide (BENICAR HCT) 40-12.5 MG per tablet Take 1 tablet by mouth daily.      . pioglitazone (ACTOS) 30 MG tablet Take 30 mg by mouth daily.      . simvastatin (ZOCOR) 40 MG tablet Take 40 mg by mouth at  bedtime.         Assessment: 56 y/o female patient s/p   L TKA receiving coumadin for dvt px. INR large increase after 2 doses of 7.5mg  , will decrease dose today. No bleeding reported.  Goal of Therapy:  INR 2-3   Plan:  Coumadin 2.5mg  today and f/u in am.  Verlene Mayer, PharmD, BCPS Pager [phone] 07/16/2011,10:46 AM

## 2011-07-16 NOTE — Progress Notes (Signed)
Physical Therapy Treatment Note   07/16/11 0845  PT Visit Information  Last PT Received On 07/16/11  Precautions  Precautions Knee  Restrictions  LLE Weight Bearing WBAT  Transfers  Sit to Stand 4: Min assist (contact guard)  Sit to Stand Details (indicate cue type and reason) increased time due to increased L knee pain  Ambulation/Gait  Ambulation/Gait Assistance 4: Min assist (contact guard)  Ambulation Distance (Feet) 100 Feet  Assistive device Rolling walker  Gait Pattern Step-to pattern;Decreased step length - left;Decreased stance time - left  Gait velocity improved from yesterday however pt reports <50% WBing thru L LE  Stairs No  Wheelchair Mobility  Wheelchair Mobility No  Posture/Postural Control  Posture/Postural Control No significant limitations  Balance  Balance Assessed No  Total Joint Exercises  Quad Sets AROM;Left;10 reps;Seated (with LEs elevated)  Knee Flexion AAROM;Left;5 reps;Seated  Short Arc Quad AROM;AAROM;10 reps;Left;Seated  Heel Slides AROM;AAROM;Left;20 reps;Seated  PT - End of Session  Equipment Utilized During Treatment Gait belt  Activity Tolerance Patient tolerated treatment well  Patient left in chair;with call bell in reach  Nurse Communication Mobility status for transfers;Mobility status for ambulation  General  Behavior During Session [facility] for tasks performed  Cognition [facility] for tasks performed  PT - Assessment/Plan  Comments on Treatment Session Patient con't to have increased L knee pain however tolerated tx well.Patient with approx 60 degress of active L knee flexion in sitting. Patient demonstrates improved quad control but con't to be unable to put more then 50% of her weight through L LE  PT Plan Discharge plan remains appropriate;Frequency remains appropriate  PT Frequency 7X/week  Follow Up Recommendations Home health PT;Supervision - Intermittent  Equipment Recommended None recommended by PT  Acute Rehab PT Goals  PT Goal:  Sit to Stand - Progress Progressing toward goal  PT Goal: Ambulate - Progress Progressing toward goal  PT Goal: Perform Home Exercise Program - Progress Progressing toward goal    Pain: 8/10 L knee pain however improved s/p PT  Lewis Shock, PT, DPT Pager #: [phone] Office #: [phone]

## 2011-07-17 ENCOUNTER — Other Ambulatory Visit: Payer: Self-pay | Admitting: Endocrinology

## 2011-07-17 LAB — CBC
MCH: 30.1 pg (ref 26.0–34.0)
MCHC: 33.3 g/dL (ref 30.0–36.0)
MCV: 90.4 fL (ref 78.0–100.0)
Platelets: 242 10*3/uL (ref 150–400)
RBC: 3.22 MIL/uL — ABNORMAL LOW (ref 3.87–5.11)

## 2011-07-17 NOTE — Progress Notes (Signed)
Physical Therapy Treatment Note   07/17/11 0900  PT Visit Information  Last PT Received On 07/17/11  Precautions  Precautions Knee  Restrictions  LLE Weight Bearing WBAT  Bed Mobility  Bed Mobility (pt received sitting EOB)  Transfers  Sit to Stand 6: Modified independent (Device/Increase time)  Sit to Stand Details (indicate cue type and reason) v/c's to take time  Ambulation/Gait  Ambulation/Gait Assistance 5: Supervision  Ambulation/Gait Assistance Details (indicate cue type and reason) v/c's to achieve terminal knee extension and to increase L LE WBing instead of increasing speed  Ambulation Distance (Feet) 200 Feet  Assistive device Rolling walker  Gait Pattern Step-through pattern;Decreased step length - left;Decreased stance time - left  Gait velocity improved  Stairs Yes  Stairs Assistance 4: Min assist  Stairs Assistance Details (indicate cue type and reason) minA for backwards technique for walker management, spouse present and return demonstrated good technique, handout provided. MinA for R HHA for forwards technique with L HR  Stair Management Technique Backwards;With walker (also fwd with L HR and R HHA)  Number of Stairs 2  (x 4 trials)  Posture/Postural Control  Posture/Postural Control No significant limitations  Balance  Balance Assessed No  PT - End of Session  Equipment Utilized During Treatment Gait belt  Activity Tolerance Patient tolerated treatment well  Patient left with call bell in reach;with family/visitor present (sitting EOB)  Nurse Communication Mobility status for transfers;Mobility status for ambulation  General  Behavior During Session [facility] for tasks performed  Cognition [facility] for tasks performed  PT - Assessment/Plan  Comments on Treatment Session Patient with increased spirits regarding being d/c today and under better pain control. Patient and spouse demonstrate safe transfer, ambulation and stair negotiation technique. Pt safe to return home  this date with spouse.  PT Plan Discharge plan remains appropriate;Frequency remains appropriate  PT Frequency 7X/week  Follow Up Recommendations Home health PT;Supervision - Intermittent  Equipment Recommended None recommended by PT  Acute Rehab PT Goals  PT Goal: Sit to Stand - Progress Progressing toward goal  PT Goal: Ambulate - Progress Progressing toward goal  PT Goal: Up/Down Stairs - Progress Progressing toward goal  PT Goal: Perform Home Exercise Program - Progress Met    Pain: pt did not rate but reports pain to be much better  Lewis Shock, PT, DPT Pager #: [phone] Office #: [phone]

## 2011-07-17 NOTE — Progress Notes (Signed)
Subjective: Pt stable pain controlled   Objective: Vital signs in last 24 hours: Temp:  [98.2 F (36.8 C)-98.8 F (37.1 C)] 98.2 F (36.8 C) (04/12 0534) Pulse Rate:  [92-94] 92  (04/12 0534) Resp:  [18-20] 18  (04/12 0534) BP: (106-113)/(59-65) 113/59 mmHg (04/12 0534) SpO2:  [93 %-97 %] 97 % (04/12 0534)  Intake/Output from previous day: 04/11 0701 - 04/12 0700 In: 720 [P.O.:720] Out: 1 [Urine:1] Intake/Output this shift:    Exam:  Neurovascular intact Sensation intact distally Intact pulses distally Incision: no drainage  Labs:  Basename 07/17/11 0520 07/16/11 0640 07/15/11 0620  HGB 9.7* 10.2* 11.7*    Basename 07/17/11 0520 07/16/11 0640  WBC 11.4* 13.0*  RBC 3.22* 3.44*  HCT 29.1* 31.0*  PLT 242 249    Basename 07/15/11 0620  NA 136  K 4.1  CL 101  CO2 24  BUN 13  CREATININE 0.62  GLUCOSE 148*  CALCIUM 8.9    Basename 07/17/11 0520 07/16/11 0640  LABPT -- --  INR 1.77* 1.95*    Assessment/Plan: Dc today will add ambien for dc   Ezel Vallone SCOTT 07/17/2011, 6:54 AM

## 2011-07-19 NOTE — Discharge Summary (Signed)
Physician Discharge Summary  Patient ID: Chelsea Gray MRN: [id number] DOB/AGE: May 14, 1955 56 y.o.  Admit date: 07/14/2011 Discharge date: 07/19/2011  Admission Diagnoses:  Left knee arthritis  Discharge Diagnoses:  Same  Surgeries: Procedure(s): COMPUTER ASSISTED TOTAL KNEE ARTHROPLASTY on 07/14/2011   Consultants:    Discharged Condition: Stable  Hospital Course: Chelsea Gray is an 56 y.o. female who was admitted 07/14/2011 with a chief complaint of left knee pain, and found to have a diagnosis of left knee arthritis.  They were brought to the operating room on 07/14/2011 and underwent the above named procedures.    Antibiotics given:  Anti-infectives     Start     Dose/Rate Route Frequency Ordered Stop   07/14/11 2000   ceFAZolin (ANCEF) IVPB 1 g/50 mL premix        1 g 100 mL/hr over 30 Minutes Intravenous 4 times per day 07/14/11 1918 07/15/11 0207   07/14/11 1345   ceFAZolin (ANCEF) IVPB 1 g/50 mL premix  Status:  Discontinued        1 g 100 mL/hr over 30 Minutes Intravenous 4 times per day 07/14/11 1331 07/14/11 1918   07/14/11 0609   ceFAZolin (ANCEF) 2-3 GM-% IVPB SOLR     Comments: Ramond Craver: cabinet override         07/14/11 0609 07/14/11 1814   07/13/11 1411   ceFAZolin (ANCEF) IVPB 2 g/50 mL premix  Status:  Discontinued        2 g 100 mL/hr over 30 Minutes Intravenous 60 min pre-op 07/13/11 1411 07/14/11 1415        .  Recent vital signs:  Filed Vitals:   07/17/11 0534  BP: 113/59  Pulse: 92  Temp: 98.2 F (36.8 C)  Resp: 18    Recent laboratory studies:  Results for orders placed during the hospital encounter of 07/14/11  GLUCOSE, CAPILLARY      Component Value Range   Glucose-Capillary 122 (*) 70 - 99 (mg/dL)  GLUCOSE, CAPILLARY      Component Value Range   Glucose-Capillary 151 (*) 70 - 99 (mg/dL)  GLUCOSE, CAPILLARY      Component Value Range   Glucose-Capillary 111 (*) 70 - 99 (mg/dL)   Comment 1 Documented in Chart     Comment 2 Notify RN    PROTIME-INR      Component Value Range   Prothrombin Time 13.7  11.6 - 15.2 (seconds)   INR 1.03  0.00 - 1.49   CBC      Component Value Range   WBC 11.5 (*) 4.0 - 10.5 (K/uL)   RBC 3.84 (*) 3.87 - 5.11 (MIL/uL)   Hemoglobin 11.7 (*) 12.0 - 15.0 (g/dL)   HCT 65.7 (*) 84.6 - 46.0 (%)   MCV 91.1  78.0 - 100.0 (fL)   MCH 30.5  26.0 - 34.0 (pg)   MCHC 33.4  30.0 - 36.0 (g/dL)   RDW 96.2  95.2 - 84.1 (%)   Platelets 264  150 - 400 (K/uL)  BASIC METABOLIC PANEL      Component Value Range   Sodium 136  135 - 145 (mEq/L)   Potassium 4.1  3.5 - 5.1 (mEq/L)   Chloride 101  96 - 112 (mEq/L)   CO2 24  19 - 32 (mEq/L)   Glucose, Bld 148 (*) 70 - 99 (mg/dL)   BUN 13  6 - 23 (mg/dL)   Creatinine, Ser 3.24  0.50 - 1.10 (mg/dL)   Calcium 8.9  8.4 - 10.5 (mg/dL)   GFR calc non Af Amer >90  >90 (mL/min)   GFR calc Af Amer >90  >90 (mL/min)  GLUCOSE, CAPILLARY      Component Value Range   Glucose-Capillary 143 (*) 70 - 99 (mg/dL)  GLUCOSE, CAPILLARY      Component Value Range   Glucose-Capillary 132 (*) 70 - 99 (mg/dL)  GLUCOSE, CAPILLARY      Component Value Range   Glucose-Capillary 148 (*) 70 - 99 (mg/dL)   Comment 1 Notify RN     Comment 2 Documented in Chart    GLUCOSE, CAPILLARY      Component Value Range   Glucose-Capillary 149 (*) 70 - 99 (mg/dL)   Comment 1 Documented in Chart     Comment 2 Notify RN    PROTIME-INR      Component Value Range   Prothrombin Time 22.6 (*) 11.6 - 15.2 (seconds)   INR 1.95 (*) 0.00 - 1.49   CBC      Component Value Range   WBC 13.0 (*) 4.0 - 10.5 (K/uL)   RBC 3.44 (*) 3.87 - 5.11 (MIL/uL)   Hemoglobin 10.2 (*) 12.0 - 15.0 (g/dL)   HCT 09.8 (*) 11.9 - 46.0 (%)   MCV 90.1  78.0 - 100.0 (fL)   MCH 29.7  26.0 - 34.0 (pg)   MCHC 32.9  30.0 - 36.0 (g/dL)   RDW 14.7  82.9 - 56.2 (%)   Platelets 249  150 - 400 (K/uL)  GLUCOSE, CAPILLARY      Component Value Range   Glucose-Capillary 155 (*) 70 - 99 (mg/dL)   Comment 1  Documented in Chart     Comment 2 Notify RN    GLUCOSE, CAPILLARY      Component Value Range   Glucose-Capillary 132 (*) 70 - 99 (mg/dL)  GLUCOSE, CAPILLARY      Component Value Range   Glucose-Capillary 159 (*) 70 - 99 (mg/dL)   Comment 1 Notify RN     Comment 2 Documented in Chart    GLUCOSE, CAPILLARY      Component Value Range   Glucose-Capillary 105 (*) 70 - 99 (mg/dL)  PROTIME-INR      Component Value Range   Prothrombin Time 20.9 (*) 11.6 - 15.2 (seconds)   INR 1.77 (*) 0.00 - 1.49   CBC      Component Value Range   WBC 11.4 (*) 4.0 - 10.5 (K/uL)   RBC 3.22 (*) 3.87 - 5.11 (MIL/uL)   Hemoglobin 9.7 (*) 12.0 - 15.0 (g/dL)   HCT 13.0 (*) 86.5 - 46.0 (%)   MCV 90.4  78.0 - 100.0 (fL)   MCH 30.1  26.0 - 34.0 (pg)   MCHC 33.3  30.0 - 36.0 (g/dL)   RDW 78.4  69.6 - 29.5 (%)   Platelets 242  150 - 400 (K/uL)  GLUCOSE, CAPILLARY      Component Value Range   Glucose-Capillary 113 (*) 70 - 99 (mg/dL)  GLUCOSE, CAPILLARY      Component Value Range   Glucose-Capillary 146 (*) 70 - 99 (mg/dL)    Discharge Medications:   Medication List  As of 07/19/2011  8:22 PM   STOP taking these medications         HYDROcodone-acetaminophen 5-500 MG per tablet         TAKE these medications         glimepiride 1 MG tablet   Commonly known as: AMARYL  Take 1 mg by mouth daily before breakfast.      glimepiride 1 MG tablet   Commonly known as: AMARYL   TAKE 1 TABLET BY MOUTH EVERY MORNING      HYDROcodone-acetaminophen 10-325 MG per tablet   Commonly known as: NORCO   Take 1-2 tablets by mouth every 4 (four) hours as needed.      Insulin Pen Needle 31G X 8 MM Misc   1 once daily      Liraglutide 18 MG/3ML Soln   Inject 1.8 mg into the skin every morning.      methocarbamol 500 MG tablet   Commonly known as: ROBAXIN   Take 1 tablet (500 mg total) by mouth every 6 (six) hours as needed.      olmesartan-hydrochlorothiazide 40-12.5 MG per tablet   Commonly known as: BENICAR  HCT   Take 1 tablet by mouth daily.      pioglitazone 30 MG tablet   Commonly known as: ACTOS   Take 30 mg by mouth daily.      simvastatin 40 MG tablet   Commonly known as: ZOCOR   Take 40 mg by mouth at bedtime.      warfarin 2.5 MG tablet   Commonly known as: COUMADIN   Take 1 tablet (2.5 mg total) by mouth one time only at 6 PM.            Diagnostic Studies: Dg Chest 2 View  07/02/2011  *RADIOLOGY REPORT*  Clinical Data: Preop for left knee replacement  CHEST - 2 VIEW  Comparison: 07/18/2008  Findings: Mediastinal silhouette is stable.  No acute infiltrate or pleural effusion.  No pulmonary edema.  Bony thorax is stable.  IMPRESSION: No active disease.  Original Report Authenticated By: Natasha Mead, M.D.   X-ray Knee Left Port  07/14/2011  *RADIOLOGY REPORT*  Clinical Data: Postop.  PORTABLE LEFT KNEE - 1-2 VIEW  Comparison: None.  Findings: Changes of left knee replacement.  Soft tissue drain in place.  No hardware or bony complicating feature.  Normal alignment.  IMPRESSION: Left knee replacement without complicating feature.  Original Report Authenticated By: Cyndie Chime, M.D.    Disposition: 01-Home or Self Care  Discharge Orders    Future Appointments: Provider: Department: Dept Phone: Center:   08/19/2011 8:45 AM Romero Belling, MD [facility] [phone] [facility]     Future Orders Please Complete By Expires   Diet - low sodium heart healthy      Call MD / Call 911      Comments:   If you experience chest pain or shortness of breath, CALL 911 and be transported to the hospital emergency room.  If you develope a fever above 101 F, pus (white drainage) or increased drainage or redness at the wound, or calf pain, call your surgeon's office.   Constipation Prevention      Comments:   Drink plenty of fluids.  Prune juice may be helpful.  You may use a stool softener, such as Colace (over the counter) 100 mg twice a day.  Use MiraLax (over the counter) for constipation as needed.    Increase activity slowly as tolerated      Weight Bearing as taught in Physical Therapy      Comments:   Use a walker or crutches as instructed.   Discharge instructions      Comments:   Weight bearing as tolerated with crutches CPM 2 hours per 8 Keep incision dry.  SignedCammy Copa 07/19/2011, 8:22 PM

## 2011-08-19 ENCOUNTER — Ambulatory Visit: Payer: 59 | Admitting: Endocrinology

## 2011-09-16 ENCOUNTER — Encounter: Payer: Self-pay | Admitting: Endocrinology

## 2011-09-16 ENCOUNTER — Other Ambulatory Visit (INDEPENDENT_AMBULATORY_CARE_PROVIDER_SITE_OTHER): Payer: 59

## 2011-09-16 ENCOUNTER — Ambulatory Visit (INDEPENDENT_AMBULATORY_CARE_PROVIDER_SITE_OTHER): Payer: 59 | Admitting: Endocrinology

## 2011-09-16 VITALS — BP 102/64 | HR 84 | Temp 98.2°F | Ht 66.0 in | Wt 189.0 lb

## 2011-09-16 DIAGNOSIS — E119 Type 2 diabetes mellitus without complications: Secondary | ICD-10-CM

## 2011-09-16 LAB — HEMOGLOBIN A1C: Hgb A1c MFr Bld: 6.5 % (ref 4.6–6.5)

## 2011-09-16 MED ORDER — GLUCOSE BLOOD VI STRP: ORAL_STRIP | Status: AC

## 2011-09-16 NOTE — Patient Instructions (Addendum)
Please make a follow-up appointment in 4 months. check your blood sugar 1 time a day.  vary the time of day when you check, between before the 3 meals, and at bedtime.  also check if you have symptoms of your blood sugar being too high or too low.  please keep a record of the readings and bring it to your next appointment here.  please call us sooner if you are having low blood sugar episodes. Skip 1 day of benicar-hct, then resume at 1/2 pill per day. Please see dr Valentina Lucks soon to follow-up your blood pressure and symptoms.   blood tests are being requested for you today.  You will receive a letter with results.

## 2011-09-16 NOTE — Progress Notes (Signed)
Subjective:    Patient ID: Chelsea Gray, female    DOB: [birth year], 56 y.o.   MRN: [id number]  HPI Pt returns for f/u of type 2 DM (dx'ed 2006; no known complications).  She has lost weight since last ov, but cbg's have been high since TKR 2 mos ago.  She has a few days of intermittent slight lightheadedness, and assoc diaphoresis. Past Medical History  Diagnosis Date  . DM 01/24/2009  . HYPERCHOLESTEROLEMIA 01/24/2009  . DEPRESSION 01/24/2009  . HYPERTENSION 01/24/2009  . Pulmonary embolism     result of blood transfusion reaction 18 years ago after childbirth  . Blood transfusion     d/t hemmorrhage post c- section  . 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 Osteoarthritis   . History of one miscarriage     Past Surgical History  Procedure Date  . Cesarean section   . Cervical cerclage   . Breast reduction surgery   . Breast cyst excision   . Knee arthroplasty 07/14/2011    Procedure: COMPUTER ASSISTED TOTAL KNEE ARTHROPLASTY;  Surgeon: Cammy Copa, MD;  Location: [facility];  Service: Orthopedics;  Laterality: Left;  Left total knee arthroplasty    History   Social History  . Marital Status: Married    Spouse Name: N/A    Number of Children: N/A  . Years of Education: N/A   Occupational History  . Insurance    Social History Main Topics  . Smoking status: Current Everyday Smoker -- 0.1 packs/day for 6 years  . Smokeless tobacco: Not on file  . Alcohol Use: Yes     once drink every few months  . Drug Use: No  . Sexually Active: Not on file   Other Topics Concern  . Not on file   Social History Narrative  . No narrative on file    Current Outpatient Prescriptions on File Prior to Visit  Medication Sig Dispense Refill  . Insulin Pen Needle (B-D ULTRAFINE III SHORT PEN) 31G X 8 MM MISC 1 once daily  30 each  11  . Liraglutide 18 MG/3ML SOLN Inject 1.8 mg into the skin every morning.      .  olmesartan-hydrochlorothiazide (BENICAR HCT) 40-12.5 MG per tablet Take 1 tablet by mouth daily.      . pioglitazone (ACTOS) 30 MG tablet Take 30 mg by mouth daily.      . simvastatin (ZOCOR) 40 MG tablet Take 40 mg by mouth at bedtime.       Marland Kitchen DISCONTD: bromocriptine (PARLODEL) 2.5 MG tablet Take 2.5 mg by mouth at bedtime.        Marland Kitchen DISCONTD: olmesartan (BENICAR) 5 MG tablet Take 5 mg by mouth daily.          Allergies  Allergen Reactions  . Codeine Sulfate Nausea And Vomiting    REACTION: dizziness  . Metformin Diarrhea    REACTION: fatigue, diarrhea    Family History  Problem Relation Age of Onset  . Diabetes Mother   . Diabetes Father   . Anesthesia problems Neg Hx     BP 102/64  Pulse 84  Temp 98.2 F (36.8 C) (Oral)  Ht 5\' 6"  (1.676 m)  Wt 189 lb (85.73 kg)  BMI 30.51 kg/m2  SpO2 98%    Review of Systems denies hypoglycemia and LOC    Objective:   Physical Exam VITAL SIGNS:  See vs page GENERAL: no distress  Pulses: dorsalis pedis intact bilat.   Feet: no deformity.  no ulcer on the feet.  feet are of normal color and temp.  no edema Neuro: sensation is intact to touch on the feet    Lab Results  Component Value Date   HGBA1C 6.5 09/16/2011      Assessment & Plan:  DM. well-controlled. HTN, overcontrolled Dizziness, possibly due to bp rx

## 2011-09-17 ENCOUNTER — Telehealth: Payer: Self-pay | Admitting: *Deleted

## 2011-09-17 NOTE — Telephone Encounter (Signed)
Called pt to inform of lab results, pt informed (letter also mailed to pt). 

## 2011-10-21 ENCOUNTER — Other Ambulatory Visit: Payer: Self-pay | Admitting: Endocrinology

## 2011-12-16 ENCOUNTER — Ambulatory Visit: Payer: 59 | Admitting: Endocrinology

## 2011-12-17 ENCOUNTER — Ambulatory Visit (INDEPENDENT_AMBULATORY_CARE_PROVIDER_SITE_OTHER): Payer: 59 | Admitting: Endocrinology

## 2011-12-17 ENCOUNTER — Encounter: Payer: Self-pay | Admitting: Endocrinology

## 2011-12-17 ENCOUNTER — Other Ambulatory Visit (INDEPENDENT_AMBULATORY_CARE_PROVIDER_SITE_OTHER): Payer: 59

## 2011-12-17 VITALS — BP 130/78 | HR 90 | Temp 97.5°F | Resp 15 | Wt 194.4 lb

## 2011-12-17 DIAGNOSIS — E119 Type 2 diabetes mellitus without complications: Secondary | ICD-10-CM

## 2011-12-17 DIAGNOSIS — Z23 Encounter for immunization: Secondary | ICD-10-CM

## 2011-12-17 LAB — HEMOGLOBIN A1C: Hgb A1c MFr Bld: 7.5 % — ABNORMAL HIGH (ref 4.6–6.5)

## 2011-12-17 NOTE — Progress Notes (Signed)
Subjective:    Patient ID: Chelsea Gray, female    DOB: 02-01-1956, 56 y.o.   MRN: [id number]  HPI Pt returns for f/u of type 2 DM (dx'ed 2006; no known complications; metformin caused generalized weakness).  She has lost weight since last ov, but cbg's have been high since TKR 2 mos ago.  She has a few days of intermittent slight lightheadedness, and assoc diaphoresis.  Past Medical History  Diagnosis Date  . DM 01/24/2009  . HYPERCHOLESTEROLEMIA 01/24/2009  . DEPRESSION 01/24/2009  . HYPERTENSION 01/24/2009  . Pulmonary embolism     result of blood transfusion reaction 18 years ago after childbirth  . Blood transfusion     d/t hemmorrhage post c- section  . 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 Osteoarthritis   . History of one miscarriage     Past Surgical History  Procedure Date  . Cesarean section   . Cervical cerclage   . Breast reduction surgery   . Breast cyst excision   . Knee arthroplasty 07/14/2011    Procedure: COMPUTER ASSISTED TOTAL KNEE ARTHROPLASTY;  Surgeon: Cammy Copa, MD;  Location: [facility] OR;  Service: Orthopedics;  Laterality: Left;  Left total knee arthroplasty    History   Social History  . Marital Status: Married    Spouse Name: N/A    Number of Children: N/A  . Years of Education: N/A   Occupational History  . Insurance    Social History Main Topics  . Smoking status: Current Every Day Smoker -- 0.1 packs/day for 6 years  . Smokeless tobacco: Not on file  . Alcohol Use: Yes     once drink every few months  . Drug Use: No  . Sexually Active: Not on file   Other Topics Concern  . Not on file   Social History Narrative  . No narrative on file    Current Outpatient Prescriptions on File Prior to Visit  Medication Sig Dispense Refill  . glucose blood (FREESTYLE LITE) test strip Use as instructed twice daily  100 each  3  . Insulin Pen Needle (B-D ULTRAFINE III SHORT PEN) 31G X 8 MM  MISC 1 once daily  30 each  11  . Liraglutide 18 MG/3ML SOLN Inject 1.8 mg into the skin every morning.      . olmesartan (BENICAR) 20 MG tablet Take 20 mg by mouth daily.      . pioglitazone (ACTOS) 30 MG tablet TAKE 1 TABLET BY MOUTH DAILY  90 tablet  3  . promethazine (PHENERGAN) 25 MG tablet       . simvastatin (ZOCOR) 40 MG tablet Take 40 mg by mouth at bedtime.       Marland Kitchen zolpidem (AMBIEN) 5 MG tablet       . buPROPion (WELLBUTRIN SR) 150 MG 12 hr tablet       . DISCONTD: bromocriptine (PARLODEL) 2.5 MG tablet Take 2.5 mg by mouth at bedtime.          Allergies  Allergen Reactions  . Codeine Sulfate Nausea And Vomiting    REACTION: dizziness  . Metformin Diarrhea    REACTION: fatigue, diarrhea    Family History  Problem Relation Age of Onset  . Diabetes Mother   . Diabetes Father   . Anesthesia problems Neg Hx     BP 130/78  Pulse 90  Temp 97.5 F (36.4 C) (Oral)  Resp 15  Wt 194 lb 6 oz (88.168 kg)  SpO2 98%    Review of Systems     Objective:   Physical Exam VITAL SIGNS:  See vs page GENERAL: no distress SKIN:  Insulin injection sites at the anterior abdomen are normal  Lab Results  Component Value Date   HGBA1C 7.5* 12/17/2011      Assessment & Plan:  DM: Needs increased rx, if it can be done with a regimen that avoids or minimizes hypoglycemia.

## 2011-12-17 NOTE — Patient Instructions (Addendum)
Please make a follow-up appointment in 4 months. check your blood sugar 1 time a day.  vary the time of day when you check, between before the 3 meals, and at bedtime.  also check if you have symptoms of your blood sugar being too high or too low.  please keep a record of the readings and bring it to your next appointment here.  please call us sooner if you are having low blood sugar episodes.  blood tests are being requested for you today.  You will receive a letter with results.

## 2011-12-23 ENCOUNTER — Telehealth: Payer: Self-pay | Admitting: *Deleted

## 2011-12-23 NOTE — Telephone Encounter (Signed)
You should add "bromocriptine."  It is generic

## 2011-12-23 NOTE — Telephone Encounter (Signed)
Patient has questions RE: results letter received [A1C] and new medication recommended; would like to know what Rx recommending/SLS Please advise patient.

## 2011-12-24 MED ORDER — BROMOCRIPTINE MESYLATE 2.5 MG PO TABS: 2.5000 mg | ORAL_TABLET | Freq: Every day | ORAL | Status: DC

## 2011-12-24 NOTE — Telephone Encounter (Signed)
Pt informed of new rx and of MD's advisement.  

## 2011-12-24 NOTE — Telephone Encounter (Signed)
i sent rx Please start taking "bromocriptine," to help your blood sugar. It has possible side effects of nausea and dizziness.  These go away with time.  You can avoid these by taking it at bedtime, and by taking just take 1/2 pill for the first week.

## 2011-12-24 NOTE — Telephone Encounter (Signed)
Pt informed-pt would like rx for Bromocriptine sent to her pharmacy.

## 2012-01-13 ENCOUNTER — Ambulatory Visit: Payer: 59 | Admitting: Endocrinology

## 2012-02-04 ENCOUNTER — Ambulatory Visit (HOSPITAL_COMMUNITY)
Admission: RE | Admit: 2012-02-04 | Discharge: 2012-02-04 | Disposition: A | Discharge status: Home / Self Care | Payer: 59 | Source: Ambulatory Visit | Attending: Orthopedic Surgery | Admitting: Orthopedic Surgery

## 2012-02-04 DIAGNOSIS — M79606 Pain in leg, unspecified: Secondary | ICD-10-CM

## 2012-02-04 DIAGNOSIS — M79609 Pain in unspecified limb: Secondary | ICD-10-CM | POA: Insufficient documentation

## 2012-02-04 DIAGNOSIS — M7989 Other specified soft tissue disorders: Secondary | ICD-10-CM

## 2012-02-04 NOTE — Progress Notes (Signed)
VASCULAR LAB PRELIMINARY  PRELIMINARY  PRELIMINARY  PRELIMINARY  Left leg venous duplex completed.    Preliminary report: Left leg is negative for deep and superficial vein thrombosis.  No evidence of a Baker's cyst noted.  Report called to Toniann Fail  In Dr. Timoteo Expose. Dean's office.  Instructed to tell patient to ice and elevate as instructed in office.  Chelsea Gray, 02/04/2012, 3:02 PM

## 2012-02-23 ENCOUNTER — Other Ambulatory Visit: Payer: Self-pay | Admitting: Endocrinology

## 2012-04-21 ENCOUNTER — Ambulatory Visit: Payer: 59 | Admitting: Endocrinology

## 2012-04-27 ENCOUNTER — Ambulatory Visit: Payer: 59 | Admitting: Endocrinology

## 2012-05-04 ENCOUNTER — Ambulatory Visit: Payer: 59 | Admitting: Endocrinology

## 2012-05-12 ENCOUNTER — Ambulatory Visit (INDEPENDENT_AMBULATORY_CARE_PROVIDER_SITE_OTHER): Payer: 59 | Admitting: Endocrinology

## 2012-05-12 ENCOUNTER — Encounter: Payer: Self-pay | Admitting: Endocrinology

## 2012-05-12 VITALS — BP 126/76 | HR 78 | Wt 204.0 lb

## 2012-05-12 DIAGNOSIS — E119 Type 2 diabetes mellitus without complications: Secondary | ICD-10-CM

## 2012-05-12 LAB — MICROALBUMIN / CREATININE URINE RATIO: Microalb Creat Ratio: 0.4 mg/g (ref 0.0–30.0)

## 2012-05-12 MED ORDER — CANAGLIFLOZIN 100 MG PO TABS: 1.0000 | ORAL_TABLET | Freq: Every day | ORAL | Status: DC

## 2012-05-12 MED ORDER — INSULIN PEN NEEDLE 32G X 5 MM MISC: 1.0000 | Freq: Every day | Status: DC

## 2012-05-12 NOTE — Progress Notes (Signed)
Subjective:    Patient ID: Chelsea Gray, female    DOB: 05-Aug-1955, 57 y.o.   MRN: [id number]  HPI Pt returns for f/u of type 2 DM (dx'ed 2006; no known complications; metformin caused generalized weakness).  pt states she feels well in general.  no cbg record, but states cbg's are well-controlled. Past Medical History  Diagnosis Date  . DM 01/24/2009  . HYPERCHOLESTEROLEMIA 01/24/2009  . DEPRESSION 01/24/2009  . HYPERTENSION 01/24/2009  . Pulmonary embolism     result of blood transfusion reaction 18 years ago after childbirth  . Blood transfusion     d/t hemmorrhage post c- section  . 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 Osteoarthritis   . History of one miscarriage     Past Surgical History  Procedure Date  . Cesarean section   . Cervical cerclage   . Breast reduction surgery   . Breast cyst excision   . Knee arthroplasty 07/14/2011    Procedure: COMPUTER ASSISTED TOTAL KNEE ARTHROPLASTY;  Surgeon: Cammy Copa, MD;  Location: [facility] OR;  Service: Orthopedics;  Laterality: Left;  Left total knee arthroplasty    History   Social History  . Marital Status: Married    Spouse Name: N/A    Number of Children: N/A  . Years of Education: N/A   Occupational History  . Insurance    Social History Main Topics  . Smoking status: Current Every Day Smoker -- 0.1 packs/day for 6 years  . Smokeless tobacco: Not on file  . Alcohol Use: Yes     Comment: once drink every few months  . Drug Use: No  . Sexually Active: Not on file   Other Topics Concern  . Not on file   Social History Narrative  . No narrative on file    Current Outpatient Prescriptions on File Prior to Visit  Medication Sig Dispense Refill  . amoxicillin-clavulanate (AUGMENTIN) 875-125 MG per tablet       . bromocriptine (PARLODEL) 2.5 MG tablet Take 1 tablet (2.5 mg total) by mouth at bedtime.  30 tablet  11  . buPROPion (WELLBUTRIN SR) 150 MG 12 hr  tablet       . glucose blood (FREESTYLE LITE) test strip Use as instructed twice daily  100 each  3  . Liraglutide 18 MG/3ML SOLN Inject 1.8 mg into the skin every morning.      . methocarbamol (ROBAXIN) 500 MG tablet       . olmesartan (BENICAR) 20 MG tablet Take 20 mg by mouth daily.      . pioglitazone (ACTOS) 30 MG tablet TAKE 1 TABLET BY MOUTH DAILY  90 tablet  3  . promethazine (PHENERGAN) 25 MG tablet       . simvastatin (ZOCOR) 40 MG tablet Take 40 mg by mouth at bedtime.       Marland Kitchen zolpidem (AMBIEN) 5 MG tablet       . oxyCODONE-acetaminophen (PERCOCET) 10-325 MG per tablet         Allergies  Allergen Reactions  . Codeine Sulfate Nausea And Vomiting    REACTION: dizziness  . Metformin Diarrhea    REACTION: fatigue, diarrhea    Family History  Problem Relation Age of Onset  . Diabetes Mother   . Diabetes Father   . Anesthesia problems Neg Hx     BP 126/76  Pulse 78  Wt 204 lb (92.534 kg)  SpO2 98%  Review of Systems denies hypoglycemia    Objective:   Physical Exam Pulses: dorsalis pedis intact bilat.   Feet: no deformity.  no ulcer on the feet.  feet are of normal color and temp.  no edema Neuro: sensation is intact to touch on the feet   Lab Results  Component Value Date   HGBA1C 7.4* 05/12/2012      Assessment & Plan:  DM: needs increased rx

## 2012-05-12 NOTE — Patient Instructions (Addendum)
Please make a follow-up appointment in 4 months. check your blood sugar 1 time a day.  vary the time of day when you check, between before the 3 meals, and at bedtime.  also check if you have symptoms of your blood sugar being too high or too low.  please keep a record of the readings and bring it to your next appointment here.  please call us sooner if you are having low blood sugar episodes.  blood tests are being requested for you today.  We'll contact you with results.

## 2012-06-24 ENCOUNTER — Telehealth: Payer: Self-pay | Admitting: Endocrinology

## 2012-06-24 MED ORDER — LIRAGLUTIDE 18 MG/3ML ~~LOC~~ SOLN: 1.8000 mg | SUBCUTANEOUS | Status: DC

## 2012-06-24 NOTE — Telephone Encounter (Signed)
Yes, generic name is "liraglutide."  Please refill prn

## 2012-06-24 NOTE — Telephone Encounter (Signed)
i didn't see Victoza on pt's med list

## 2012-06-24 NOTE — Telephone Encounter (Signed)
Please call in refill for Victoza, [facility] @ [phone]

## 2012-09-12 ENCOUNTER — Ambulatory Visit (INDEPENDENT_AMBULATORY_CARE_PROVIDER_SITE_OTHER): Payer: 59 | Admitting: Endocrinology

## 2012-09-12 ENCOUNTER — Encounter: Payer: Self-pay | Admitting: Endocrinology

## 2012-09-12 VITALS — BP 126/78 | HR 77 | Ht 66.0 in | Wt 203.0 lb

## 2012-09-12 DIAGNOSIS — E119 Type 2 diabetes mellitus without complications: Secondary | ICD-10-CM

## 2012-09-12 LAB — BASIC METABOLIC PANEL WITH GFR
BUN: 26 mg/dL — ABNORMAL HIGH (ref 6–23)
CO2: 26 meq/L (ref 19–32)
Calcium: 9.6 mg/dL (ref 8.4–10.5)
Chloride: 108 meq/L (ref 96–112)
Creatinine, Ser: 0.9 mg/dL (ref 0.4–1.2)
GFR: 88.34 mL/min
Glucose, Bld: 122 mg/dL — ABNORMAL HIGH (ref 70–99)
Potassium: 4.4 meq/L (ref 3.5–5.1)
Sodium: 141 meq/L (ref 135–145)

## 2012-09-12 LAB — HEMOGLOBIN A1C: Hgb A1c MFr Bld: 7.2 % — ABNORMAL HIGH (ref 4.6–6.5)

## 2012-09-12 MED ORDER — CANAGLIFLOZIN 300 MG PO TABS: 1.0000 | ORAL_TABLET | Freq: Every day | ORAL | Status: DC

## 2012-09-12 NOTE — Patient Instructions (Addendum)
Please make a follow-up appointment in 4 months.   check your blood sugar 1 time a day.  vary the time of day when you check, between before the 3 meals, and at bedtime.  also check if you have symptoms of your blood sugar being too high or too low.  please keep a record of the readings and bring it to your next appointment here.  please call us sooner if you are having low blood sugar episodes.   blood tests are being requested for you today.  We'll contact you with results.

## 2012-09-12 NOTE — Progress Notes (Signed)
Subjective:    Patient ID: Chelsea Gray, female    DOB: [birth year], 57 y.o.   MRN: [id number]  HPI Pt returns for f/u of type 2 DM (dx'ed 2006; He has mild if any neuropathy of the lower extremities; no known associated complications; she takes 4 meds; metformin caused generalized weakness).  pt states she feels well in general.  no cbg record, but states cbg's are well-controlled.   Past Medical History  Diagnosis Date  . DM 01/24/2009  . HYPERCHOLESTEROLEMIA 01/24/2009  . DEPRESSION 01/24/2009  . HYPERTENSION 01/24/2009  . Pulmonary embolism     result of blood transfusion reaction 18 years ago after childbirth  . Blood transfusion     d/t hemmorrhage post c- section  . 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 Osteoarthritis   . History of one miscarriage     Past Surgical History  Procedure Laterality Date  . Cesarean section    . Cervical cerclage    . Breast reduction surgery    . Breast cyst excision    . Knee arthroplasty  07/14/2011    Procedure: COMPUTER ASSISTED TOTAL KNEE ARTHROPLASTY;  Surgeon: Cammy Copa, MD;  Location: [facility] OR;  Service: Orthopedics;  Laterality: Left;  Left total knee arthroplasty    History   Social History  . Marital Status: Married    Spouse Name: N/A    Number of Children: N/A  . Years of Education: N/A   Occupational History  . Insurance    Social History Main Topics  . Smoking status: Current Every Day Smoker -- 0.10 packs/day for 6 years  . Smokeless tobacco: Not on file  . Alcohol Use: Yes     Comment: once drink every few months  . Drug Use: No  . Sexually Active: Not on file   Other Topics Concern  . Not on file   Social History Narrative  . No narrative on file    Current Outpatient Prescriptions on File Prior to Visit  Medication Sig Dispense Refill  . amoxicillin-clavulanate (AUGMENTIN) 875-125 MG per tablet       . bromocriptine (PARLODEL) 2.5 MG tablet Take  1 tablet (2.5 mg total) by mouth at bedtime.  30 tablet  11  . buPROPion (WELLBUTRIN SR) 150 MG 12 hr tablet       . glucose blood (FREESTYLE LITE) test strip Use as instructed twice daily  100 each  3  . Insulin Pen Needle (NOVOTWIST) 32G X 5 MM MISC 1 Device by Does not apply route daily.  30 each  11  . Liraglutide (VICTOZA) 18 MG/3ML SOLN injection Inject 0.3 mLs (1.8 mg total) into the skin every morning.  6 mg  6  . methocarbamol (ROBAXIN) 500 MG tablet       . olmesartan (BENICAR) 20 MG tablet Take 20 mg by mouth daily.      Marland Kitchen oxyCODONE-acetaminophen (PERCOCET) 10-325 MG per tablet       . pioglitazone (ACTOS) 30 MG tablet TAKE 1 TABLET BY MOUTH DAILY  90 tablet  3  . promethazine (PHENERGAN) 25 MG tablet       . simvastatin (ZOCOR) 40 MG tablet Take 40 mg by mouth at bedtime.       Marland Kitchen zolpidem (AMBIEN) 5 MG tablet        No current facility-administered medications on file prior to visit.    Allergies  Allergen Reactions  . Codeine  Sulfate Nausea And Vomiting    REACTION: dizziness  . Metformin Diarrhea    REACTION: fatigue, diarrhea   Family History  Problem Relation Age of Onset  . Diabetes Mother   . Diabetes Father   . Anesthesia problems Neg Hx    BP 126/78  Pulse 77  Ht 5\' 6"  (1.676 m)  Wt 203 lb (92.08 kg)  BMI 32.78 kg/m2  SpO2 98%  Review of Systems denies hypoglycemia.  She has lost a few lbs.      Objective:   Physical Exam VITAL SIGNS:  See vs page GENERAL: no distress   Lab Results  Component Value Date   HGBA1C 7.2* 09/12/2012      Assessment & Plan:  DM: Needs increased rx, if it can be done with a regimen that avoids or minimizes hypoglycemia.  We discussed the nine oral agents available for type 2 diabetes.  This regimen gives the best risk-benefit ratio.

## 2012-10-27 ENCOUNTER — Other Ambulatory Visit: Payer: Self-pay

## 2012-10-27 MED ORDER — PIOGLITAZONE HCL 30 MG PO TABS: ORAL_TABLET | ORAL | Status: DC

## 2013-01-11 ENCOUNTER — Encounter: Payer: Self-pay | Admitting: Endocrinology

## 2013-01-11 ENCOUNTER — Ambulatory Visit (INDEPENDENT_AMBULATORY_CARE_PROVIDER_SITE_OTHER): Payer: 59 | Admitting: Endocrinology

## 2013-01-11 VITALS — BP 118/80 | HR 79 | Ht 66.0 in | Wt 211.0 lb

## 2013-01-11 DIAGNOSIS — E119 Type 2 diabetes mellitus without complications: Secondary | ICD-10-CM

## 2013-01-11 LAB — HEMOGLOBIN A1C: Hgb A1c MFr Bld: 7.2 % — ABNORMAL HIGH (ref 4.6–6.5)

## 2013-01-11 MED ORDER — VARENICLINE TARTRATE 0.5 MG PO TABS: 0.5000 mg | ORAL_TABLET | Freq: Two times a day (BID) | ORAL | Status: DC

## 2013-01-11 MED ORDER — COLESEVELAM HCL 625 MG PO TABS: 1875.0000 mg | ORAL_TABLET | Freq: Two times a day (BID) | ORAL | Status: DC

## 2013-01-11 NOTE — Patient Instructions (Addendum)
Please make a follow-up appointment in 4 months.   check your blood sugar 1 time a day.  vary the time of day when you check, between before the 3 meals, and at bedtime.  also check if you have symptoms of your blood sugar being too high or too low.  please keep a record of the readings and bring it to your next appointment here.  please call us sooner if you are having low blood sugar episodes.   blood tests are being requested for you today.  We'll contact you with results.   If it is high, we can add "welchol," (preferred) or "nateglinide."   i have sent a prescription to your pharmacy, for "chantix."

## 2013-01-11 NOTE — Progress Notes (Signed)
Subjective:    Patient ID: Chelsea Gray, female    DOB: [birth year], 57 y.o.   MRN: [id number]  HPI Pt returns for f/u of type 2 DM (dx'ed 2006; she has mild if any neuropathy of the lower extremities; no known associated complications; she takes 4 meds; metformin caused generalized weakness).  pt states she feels well in general.  no cbg record, but states cbg's are well-controlled.  Past Medical History  Diagnosis Date  . DM 01/24/2009  . HYPERCHOLESTEROLEMIA 01/24/2009  . DEPRESSION 01/24/2009  . HYPERTENSION 01/24/2009  . Pulmonary embolism     result of blood transfusion reaction 18 years ago after childbirth  . Blood transfusion     d/t hemmorrhage post c- section  . 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 Osteoarthritis   . History of one miscarriage     Past Surgical History  Procedure Laterality Date  . Cesarean section    . Cervical cerclage    . Breast reduction surgery    . Breast cyst excision    . Knee arthroplasty  07/14/2011    Procedure: COMPUTER ASSISTED TOTAL KNEE ARTHROPLASTY;  Surgeon: Cammy Copa, MD;  Location: [facility] OR;  Service: Orthopedics;  Laterality: Left;  Left total knee arthroplasty    History   Social History  . Marital Status: Married    Spouse Name: N/A    Number of Children: N/A  . Years of Education: N/A   Occupational History  . Insurance    Social History Main Topics  . Smoking status: Current Every Day Smoker -- 0.10 packs/day for 6 years  . Smokeless tobacco: Not on file  . Alcohol Use: Yes     Comment: once drink every few months  . Drug Use: No  . Sexual Activity: Not on file   Other Topics Concern  . Not on file   Social History Narrative  . No narrative on file    Current Outpatient Prescriptions on File Prior to Visit  Medication Sig Dispense Refill  . amoxicillin-clavulanate (AUGMENTIN) 875-125 MG per tablet       . bromocriptine (PARLODEL) 2.5 MG tablet Take  1 tablet (2.5 mg total) by mouth at bedtime.  30 tablet  11  . buPROPion (WELLBUTRIN SR) 150 MG 12 hr tablet       . Canagliflozin (INVOKANA) 300 MG TABS Take 1 tablet by mouth daily.  30 tablet  11  . Insulin Pen Needle (NOVOTWIST) 32G X 5 MM MISC 1 Device by Does not apply route daily.  30 each  11  . Liraglutide (VICTOZA) 18 MG/3ML SOLN injection Inject 0.3 mLs (1.8 mg total) into the skin every morning.  6 mg  6  . methocarbamol (ROBAXIN) 500 MG tablet       . olmesartan (BENICAR) 20 MG tablet Take 20 mg by mouth daily.      Marland Kitchen oxyCODONE-acetaminophen (PERCOCET) 10-325 MG per tablet       . pioglitazone (ACTOS) 30 MG tablet TAKE 1 TABLET BY MOUTH DAILY  90 tablet  3  . promethazine (PHENERGAN) 25 MG tablet       . simvastatin (ZOCOR) 40 MG tablet Take 40 mg by mouth at bedtime.       Marland Kitchen zolpidem (AMBIEN) 5 MG tablet        No current facility-administered medications on file prior to visit.    Allergies  Allergen Reactions  . Codeine Sulfate  Nausea And Vomiting    REACTION: dizziness  . Metformin Diarrhea    REACTION: fatigue, diarrhea   Family History  Problem Relation Age of Onset  . Diabetes Mother   . Diabetes Father   . Anesthesia problems Neg Hx    BP 118/80  Pulse 79  Ht 5\' 6"  (1.676 m)  Wt 211 lb (95.709 kg)  BMI 34.07 kg/m2  SpO2 97%  Review of Systems She has weight gain.  She denies hypoglycemia.    Objective:   Physical Exam VITAL SIGNS:  See vs page GENERAL: no distress   Lab Results  Component Value Date   HGBA1C 7.2* 01/11/2013      Assessment & Plan:  DM: Needs increased rx, if it can be done with a regimen that avoids or minimizes hypoglycemia.  We discussed the nine oral agents available for type 2 diabetes.  This regimen gives the best risk-benefit ratio.   Smoking: this accelerates the development of chronic complications of DM.

## 2013-02-09 ENCOUNTER — Other Ambulatory Visit: Payer: Self-pay

## 2013-02-18 ENCOUNTER — Other Ambulatory Visit: Payer: Self-pay | Admitting: Endocrinology

## 2013-05-06 ENCOUNTER — Other Ambulatory Visit: Payer: Self-pay | Admitting: Endocrinology

## 2013-05-17 ENCOUNTER — Ambulatory Visit: Payer: 59 | Admitting: Endocrinology

## 2013-05-21 ENCOUNTER — Other Ambulatory Visit: Payer: Self-pay | Admitting: Endocrinology

## 2013-05-24 ENCOUNTER — Ambulatory Visit: Payer: 59 | Admitting: Endocrinology

## 2013-05-31 ENCOUNTER — Ambulatory Visit: Payer: 59 | Admitting: Endocrinology

## 2013-06-07 ENCOUNTER — Ambulatory Visit (INDEPENDENT_AMBULATORY_CARE_PROVIDER_SITE_OTHER): Payer: 59 | Admitting: Endocrinology

## 2013-06-07 VITALS — BP 120/70 | HR 89 | Temp 98.5°F | Ht 66.0 in | Wt 209.0 lb

## 2013-06-07 DIAGNOSIS — E119 Type 2 diabetes mellitus without complications: Secondary | ICD-10-CM

## 2013-06-07 LAB — MICROALBUMIN / CREATININE URINE RATIO
Creatinine,U: 68.1 mg/dL
Microalb Creat Ratio: 1.8 mg/g (ref 0.0–30.0)
Microalb, Ur: 1.2 mg/dL (ref 0.0–1.9)

## 2013-06-07 LAB — HEMOGLOBIN A1C: Hgb A1c MFr Bld: 7.5 % — ABNORMAL HIGH (ref 4.6–6.5)

## 2013-06-07 NOTE — Progress Notes (Signed)
Subjective:    Patient ID: Chelsea Gray, female    DOB: 12/19/1955, 58 y.o.   MRN: [id number]  HPI Pt returns for f/u of type 2 DM (dx'ed 2006; she has mild if any neuropathy of the lower extremities; no known associated complications; she takes victoza and 4 oral meds; metformin caused generalized weakness).  pt states she feels well in general.  no cbg record, but states cbg's are in the mid-100's. Past Medical History  Diagnosis Date  . DM 01/24/2009  . HYPERCHOLESTEROLEMIA 01/24/2009  . DEPRESSION 01/24/2009  . HYPERTENSION 01/24/2009  . Pulmonary embolism     result of blood transfusion reaction 18 years ago after childbirth  . Blood transfusion     d/t hemmorrhage post c- section  . 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 Osteoarthritis   . History of one miscarriage     Past Surgical History  Procedure Laterality Date  . Cesarean section    . Cervical cerclage    . Breast reduction surgery    . Breast cyst excision    . Knee arthroplasty  07/14/2011    Procedure: COMPUTER ASSISTED TOTAL KNEE ARTHROPLASTY;  Surgeon: Cammy Copa, MD;  Location: [facility] OR;  Service: Orthopedics;  Laterality: Left;  Left total knee arthroplasty    History   Social History  . Marital Status: Married    Spouse Name: N/A    Number of Children: N/A  . Years of Education: N/A   Occupational History  . Insurance    Social History Main Topics  . Smoking status: Current Every Day Smoker -- 0.10 packs/day for 6 years  . Smokeless tobacco: Not on file  . Alcohol Use: Yes     Comment: once drink every few months  . Drug Use: No  . Sexual Activity: Not on file   Other Topics Concern  . Not on file   Social History Narrative  . No narrative on file    Current Outpatient Prescriptions on File Prior to Visit  Medication Sig Dispense Refill  . amoxicillin-clavulanate (AUGMENTIN) 875-125 MG per tablet       . bromocriptine (PARLODEL)  2.5 MG tablet Take 1 tablet (2.5 mg total) by mouth at bedtime.  30 tablet  11  . buPROPion (WELLBUTRIN SR) 150 MG 12 hr tablet       . Canagliflozin (INVOKANA) 300 MG TABS Take 1 tablet by mouth daily.  30 tablet  11  . CHANTIX 0.5 MG tablet TAKE 1 TABLET BY MOUTH TWICE A DAY  60 tablet  0  . colesevelam (WELCHOL) 625 MG tablet Take 3 tablets (1,875 mg total) by mouth 2 (two) times daily with a meal.  180 tablet  11  . methocarbamol (ROBAXIN) 500 MG tablet       . NOVOTWIST 32G X 5 MM MISC USE AS DIRECTED  100 each  2  . olmesartan (BENICAR) 20 MG tablet Take 20 mg by mouth daily.      Marland Kitchen oxyCODONE-acetaminophen (PERCOCET) 10-325 MG per tablet       . pioglitazone (ACTOS) 30 MG tablet TAKE 1 TABLET BY MOUTH DAILY  90 tablet  3  . promethazine (PHENERGAN) 25 MG tablet       . simvastatin (ZOCOR) 40 MG tablet Take 40 mg by mouth at bedtime.       Marland Kitchen VICTOZA 18 MG/3ML SOPN INJECT 0.3 MLS (1.8 MG TOTAL) INTO THE SKIN  EVERY MORNING.  9 mL  6  . zolpidem (AMBIEN) 5 MG tablet        No current facility-administered medications on file prior to visit.    Allergies  Allergen Reactions  . Codeine Sulfate Nausea And Vomiting    REACTION: dizziness  . Metformin Diarrhea    REACTION: fatigue, diarrhea    Family History  Problem Relation Age of Onset  . Diabetes Mother   . Diabetes Father   . Anesthesia problems Neg Hx     BP 120/70  Pulse 89  Temp(Src) 98.5 F (36.9 C) (Oral)  Ht 5\' 6"  (1.676 m)  Wt 209 lb (94.802 kg)  BMI 33.75 kg/m2  SpO2 95%  Review of Systems Denies weight change and n/v    Objective:   Physical Exam VITAL SIGNS:  See vs page GENERAL: no distress  Lab Results  Component Value Date   HGBA1C 7.5* 06/07/2013      Assessment & Plan:  Type 2 DM: Needs increased rx, if it can be done with a regimen that avoids or minimizes hypoglycemia. Smoking: this accelerates the development of chronic complications of DM.

## 2013-06-07 NOTE — Patient Instructions (Addendum)
Please make a follow-up appointment in 4 months.   check your blood sugar 1 time a day.  vary the time of day when you check, between before the 3 meals, and at bedtime.  also check if you have symptoms of your blood sugar being too high or too low.  please keep a record of the readings and bring it to your next appointment here.  please call us sooner if you are having low blood sugar episodes.   blood tests are being requested for you today.  We'll contact you with results.   If it is high, we can add "nateglinide."

## 2013-06-08 MED ORDER — NATEGLINIDE 120 MG PO TABS: 120.0000 mg | ORAL_TABLET | Freq: Three times a day (TID) | ORAL | Status: DC

## 2013-09-01 ENCOUNTER — Other Ambulatory Visit: Payer: Self-pay

## 2013-09-01 DIAGNOSIS — Z1231 Encounter for screening mammogram for malignant neoplasm of breast: Secondary | ICD-10-CM

## 2013-09-11 ENCOUNTER — Ambulatory Visit
Admission: RE | Admit: 2013-09-11 | Discharge: 2013-09-11 | Disposition: A | Discharge status: Home / Self Care | Payer: 59 | Source: Ambulatory Visit

## 2013-09-11 DIAGNOSIS — Z1231 Encounter for screening mammogram for malignant neoplasm of breast: Secondary | ICD-10-CM

## 2013-09-12 ENCOUNTER — Telehealth: Payer: Self-pay | Admitting: *Deleted

## 2013-09-12 NOTE — Telephone Encounter (Signed)
PT AWARE  

## 2013-10-11 ENCOUNTER — Encounter: Payer: Self-pay | Admitting: Endocrinology

## 2013-10-11 ENCOUNTER — Ambulatory Visit (INDEPENDENT_AMBULATORY_CARE_PROVIDER_SITE_OTHER): Payer: 59 | Admitting: Endocrinology

## 2013-10-11 VITALS — BP 139/94 | HR 79 | Temp 98.3°F | Ht 66.0 in | Wt 209.0 lb

## 2013-10-11 DIAGNOSIS — E119 Type 2 diabetes mellitus without complications: Secondary | ICD-10-CM

## 2013-10-11 LAB — HEMOGLOBIN A1C: HEMOGLOBIN A1C: 7.3 % — AB (ref 4.6–6.5)

## 2013-10-11 MED ORDER — LIRAGLUTIDE 18 MG/3ML ~~LOC~~ SOPN
1.8000 mg | PEN_INJECTOR | Freq: Every day | SUBCUTANEOUS | Status: DC
Start: 2013-10-11 — End: 2013-11-21

## 2013-10-11 MED ORDER — REPAGLINIDE 2 MG PO TABS: 2.0000 mg | ORAL_TABLET | Freq: Three times a day (TID) | ORAL | Status: DC

## 2013-10-11 MED ORDER — CANAGLIFLOZIN 300 MG PO TABS: 1.0000 | ORAL_TABLET | Freq: Every day | ORAL | Status: DC

## 2013-10-11 NOTE — Progress Notes (Signed)
Subjective:    Patient ID: Chelsea Gray, female    DOB: 11/11/1955, 58 y.o.   MRN: [id number]  HPI Pt returns for f/u of type 2 DM (dx'ed 2006; she has mild if any neuropathy of the lower extremities; no known associated complications; she takes victoza and 4 oral meds; metformin caused generalized weakness; insurance declined welchol).  pt states she feels well in general.  no cbg record, but states cbg's are in the low-100's.   Past Medical History  Diagnosis Date  . DM 01/24/2009  . HYPERCHOLESTEROLEMIA 01/24/2009  . DEPRESSION 01/24/2009  . HYPERTENSION 01/24/2009  . Pulmonary embolism     result of blood transfusion reaction 18 years ago after childbirth  . Blood transfusion     d/t hemmorrhage post c- section  . 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 Osteoarthritis   . History of one miscarriage     Past Surgical History  Procedure Laterality Date  . Cesarean section    . Cervical cerclage    . Breast reduction surgery    . Breast cyst excision    . Knee arthroplasty  07/14/2011    Procedure: COMPUTER ASSISTED TOTAL KNEE ARTHROPLASTY;  Surgeon: Cammy CopaGregory Scott Dean, MD;  Location: [facility] OR;  Service: Orthopedics;  Laterality: Left;  Left total knee arthroplasty    History   Social History  . Marital Status: Married    Spouse Name: N/A    Number of Children: N/A  . Years of Education: N/A   Occupational History  . Insurance    Social History Main Topics  . Smoking status: Current Every Day Smoker -- 0.10 packs/day for 6 years  . Smokeless tobacco: Not on file  . Alcohol Use: Yes     Comment: once drink every few months  . Drug Use: No  . Sexual Activity: Not on file   Other Topics Concern  . Not on file   Social History Narrative  . No narrative on file    Current Outpatient Prescriptions on File Prior to Visit  Medication Sig Dispense Refill  . bromocriptine (PARLODEL) 2.5 MG tablet Take 1 tablet (2.5 mg  total) by mouth at bedtime.  30 tablet  11  . buPROPion (WELLBUTRIN SR) 150 MG 12 hr tablet       . CHANTIX 0.5 MG tablet TAKE 1 TABLET BY MOUTH TWICE A DAY  60 tablet  0  . methocarbamol (ROBAXIN) 500 MG tablet       . NOVOTWIST 32G X 5 MM MISC USE AS DIRECTED  100 each  2  . olmesartan (BENICAR) 20 MG tablet Take 20 mg by mouth daily.      Marland Kitchen. oxyCODONE-acetaminophen (PERCOCET) 10-325 MG per tablet       . pioglitazone (ACTOS) 30 MG tablet TAKE 1 TABLET BY MOUTH DAILY  90 tablet  3  . promethazine (PHENERGAN) 25 MG tablet       . simvastatin (ZOCOR) 40 MG tablet Take 40 mg by mouth at bedtime.       Marland Kitchen. zolpidem (AMBIEN) 5 MG tablet        No current facility-administered medications on file prior to visit.    Allergies  Allergen Reactions  . Codeine Sulfate Nausea And Vomiting    REACTION: dizziness  . Metformin Diarrhea    REACTION: fatigue, diarrhea    Family History  Problem Relation Age of Onset  . Diabetes Mother   .  Diabetes Father   . Anesthesia problems Neg Hx     BP 139/94  Pulse 79  Temp(Src) 98.3 F (36.8 C) (Oral)  Ht 5\' 6"  (1.676 m)  Wt 209 lb (94.802 kg)  BMI 33.75 kg/m2  SpO2 98%    Review of Systems She denies hypoglycemia and weight change.      Objective:   Physical Exam Pulses: dorsalis pedis intact bilat.  Feet: no deformity, except the right 2nd toe is shorter, due to surgery in the past. feet are of normal color and temp. no edema.  Old healed surgical scar on the dorsal aspect of the right foot. Skin: no ulcer on the feet.  Neuro: sensation is intact to touch on the feet.    Lab Results  Component Value Date   HGBA1C 7.3* 10/11/2013       Assessment & Plan:  DM: mild exacerbation. Weakness, perceived due to metformin.  I'll work around this as best I can.     Patient is advised the following: Patient Instructions  Please make a follow-up appointment in 4 months.   check your blood sugar 1 time a day.  vary the time of day when you  check, between before the 3 meals, and at bedtime.  also check if you have symptoms of your blood sugar being too high or too low.  please keep a record of the readings and bring it to your next appointment here.  please call us sooner if you are having low blood sugar episodes.   blood tests are being requested for you today.  We'll contact you with results.   If it is high, we can  "nateglinide" to "repaglinide."

## 2013-10-11 NOTE — Patient Instructions (Addendum)
Please make a follow-up appointment in 4 months.   check your blood sugar 1 time a day.  vary the time of day when you check, between before the 3 meals, and at bedtime.  also check if you have symptoms of your blood sugar being too high or too low.  please keep a record of the readings and bring it to your next appointment here.  please call us sooner if you are having low blood sugar episodes.   blood tests are being requested for you today.  We'll contact you with results.   If it is high, we can  "nateglinide" to "repaglinide."

## 2013-10-30 ENCOUNTER — Other Ambulatory Visit: Payer: Self-pay

## 2013-10-30 MED ORDER — PIOGLITAZONE HCL 30 MG PO TABS: ORAL_TABLET | ORAL | Status: DC

## 2013-11-21 ENCOUNTER — Emergency Department (HOSPITAL_COMMUNITY): Payer: 59

## 2013-11-21 ENCOUNTER — Encounter (HOSPITAL_COMMUNITY): Payer: Self-pay | Admitting: Emergency Medicine

## 2013-11-21 ENCOUNTER — Emergency Department (HOSPITAL_COMMUNITY)
Admission: EM | Admit: 2013-11-21 | Discharge: 2013-11-21 | Disposition: A | Discharge status: Home / Self Care | Payer: 59 | Attending: Emergency Medicine | Admitting: Emergency Medicine

## 2013-11-21 DIAGNOSIS — Y92410 Unspecified street and highway as the place of occurrence of the external cause: Secondary | ICD-10-CM | POA: Insufficient documentation

## 2013-11-21 DIAGNOSIS — Z8739 Personal history of other diseases of the musculoskeletal system and connective tissue: Secondary | ICD-10-CM | POA: Insufficient documentation

## 2013-11-21 DIAGNOSIS — I10 Essential (primary) hypertension: Secondary | ICD-10-CM | POA: Insufficient documentation

## 2013-11-21 DIAGNOSIS — F3289 Other specified depressive episodes: Secondary | ICD-10-CM | POA: Diagnosis not present

## 2013-11-21 DIAGNOSIS — F329 Major depressive disorder, single episode, unspecified: Secondary | ICD-10-CM | POA: Diagnosis not present

## 2013-11-21 DIAGNOSIS — Z79899 Other long term (current) drug therapy: Secondary | ICD-10-CM | POA: Diagnosis not present

## 2013-11-21 DIAGNOSIS — E7800 Pure hypercholesterolemia, unspecified: Secondary | ICD-10-CM | POA: Insufficient documentation

## 2013-11-21 DIAGNOSIS — S161XXA Strain of muscle, fascia and tendon at neck level, initial encounter: Secondary | ICD-10-CM

## 2013-11-21 DIAGNOSIS — IMO0002 Reserved for concepts with insufficient information to code with codable children: Secondary | ICD-10-CM | POA: Insufficient documentation

## 2013-11-21 DIAGNOSIS — Y9389 Activity, other specified: Secondary | ICD-10-CM | POA: Diagnosis not present

## 2013-11-21 DIAGNOSIS — E119 Type 2 diabetes mellitus without complications: Secondary | ICD-10-CM | POA: Diagnosis not present

## 2013-11-21 DIAGNOSIS — Z86711 Personal history of pulmonary embolism: Secondary | ICD-10-CM | POA: Insufficient documentation

## 2013-11-21 DIAGNOSIS — T1490XA Injury, unspecified, initial encounter: Secondary | ICD-10-CM | POA: Diagnosis present

## 2013-11-21 DIAGNOSIS — M1990 Unspecified osteoarthritis, unspecified site: Secondary | ICD-10-CM | POA: Diagnosis not present

## 2013-11-21 DIAGNOSIS — F17200 Nicotine dependence, unspecified, uncomplicated: Secondary | ICD-10-CM | POA: Diagnosis not present

## 2013-11-21 MED ORDER — CYCLOBENZAPRINE HCL 10 MG PO TABS
5.0000 mg | ORAL_TABLET | Freq: Once | ORAL | Status: DC
Start: 2013-11-21 — End: 2013-11-21

## 2013-11-21 MED ORDER — CYCLOBENZAPRINE HCL 10 MG PO TABS: 10.0000 mg | ORAL_TABLET | Freq: Two times a day (BID) | ORAL | Status: DC | PRN
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
## 2013-11-21 NOTE — ED Notes (Signed)
Patient returned to room from X-ray 

## 2013-11-21 NOTE — ED Notes (Signed)
Ortho called to place soft collar.

## 2013-11-21 NOTE — Progress Notes (Signed)
Orthopedic Tech Progress Note Patient Details:  Chelsea AdieGail C Gray 11/20/1955 [id number]  Ortho Devices Type of Ortho Device: Soft collar Ortho Device/Splint Location: neck Ortho Device/Splint Interventions: Ordered;Application   Jennye MoccasinHughes, [name] 11/21/2013, 10:04 PM

## 2013-11-22 NOTE — ED Provider Notes (Signed)
Medical screening examination/treatment/procedure(s) were performed by non-physician practitioner and as supervising physician I was immediately available for consultation/collaboration.   EKG Interpretation None        David H Yao, MD 11/22/13 1053 

## 2014-02-14 ENCOUNTER — Ambulatory Visit: Payer: 59 | Admitting: Endocrinology

## 2014-02-20 ENCOUNTER — Ambulatory Visit (INDEPENDENT_AMBULATORY_CARE_PROVIDER_SITE_OTHER): Payer: 59 | Admitting: Endocrinology

## 2014-02-20 ENCOUNTER — Encounter: Payer: 59 | Attending: Endocrinology | Admitting: Nutrition

## 2014-02-20 ENCOUNTER — Encounter: Payer: Self-pay | Admitting: Endocrinology

## 2014-02-20 VITALS — BP 132/86 | HR 83 | Temp 98.2°F | Ht 66.0 in | Wt 202.0 lb

## 2014-02-20 DIAGNOSIS — E119 Type 2 diabetes mellitus without complications: Secondary | ICD-10-CM

## 2014-02-20 DIAGNOSIS — Z713 Dietary counseling and surveillance: Secondary | ICD-10-CM | POA: Diagnosis not present

## 2014-02-20 LAB — HEMOGLOBIN A1C: Hgb A1c MFr Bld: 7.8 % — ABNORMAL HIGH (ref 4.6–6.5)

## 2014-02-20 NOTE — Progress Notes (Signed)
This patient is here to discuss the different kinds of insulin.  She is not on insulin as yet, but says she wants to learn about them, and decide for herself, if she should go on " a long acting insulin, or a fast acting insulin".  She reports that her Fasting blood sugars are around 110-120, but does not know what they are doing the rest of the day.  She has been loosing weight, and is exercising at the gym 4X/week for one hour.    We discussed the different insulins, how they work, and what they are designed to do, and the advantages and disadvantages of being on each one.   She has never tested her blood sugars other that in the AM.  She was advised to test her blood sugars for 1 week, both before and 2hr. After the meal--alternating the meal each day.  She was told that if her FBSs are high, then the lantus or long acting insulin would work best for her, and if her blood sugars 2 hours after the meals are higher than 160-170, then she would most likely benefit from the fasting acting insulin before each meal.  She agreed to do this, and call me the results in 1-2 weeks.  She had no final questions.

## 2014-02-20 NOTE — Patient Instructions (Signed)
Test blood sugars before and 2hr. After one meal per day--varying the meal each day. Test blood sugars before breakfast 4 times/wk. As well.

## 2014-02-20 NOTE — Patient Instructions (Addendum)
Please make a follow-up appointment in 4 months.   check your blood sugar 1 time a day.  vary the time of day when you check, between before the 3 meals, and at bedtime.  also check if you have symptoms of your blood sugar being too high or too low.  please keep a record of the readings and bring it to your next appointment here.  please call us sooner if you are having low blood sugar episodes.   blood tests are being requested for you today.  We'll contact you with results.   If it is high, we can re-add the "bromocriptine."

## 2014-02-20 NOTE — Progress Notes (Signed)
Subjective:    Patient ID: Janifer AdieGail C Jans, female    DOB: 12/26/1955, 58 y.o.   MRN: [id number]  HPI  Pt returns for f/u of diabetes mellitus: DM type: 2 Dx'ed: 2006 Complications: none Therapy: victoza and  oral meds GDM: never DKA: never Severe hypoglycemia: never Pancreatitis: never Other: metformin caused generalized weakness; insurance declined welchol Interval history:  pt states she feels well in general.  no cbg record, but states cbg's are in the low-100's.  She is not sure why she stopped parlodel. Past Medical History  Diagnosis Date  . DM 01/24/2009  . HYPERCHOLESTEROLEMIA 01/24/2009  . DEPRESSION 01/24/2009  . HYPERTENSION 01/24/2009  . Pulmonary embolism     result of blood transfusion reaction 18 years ago after childbirth  . Blood transfusion     d/t hemmorrhage post c- section  . 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 Osteoarthritis   . History of one miscarriage     Past Surgical History  Procedure Laterality Date  . Cesarean section    . Cervical cerclage    . Breast reduction surgery    . Breast cyst excision    . Knee arthroplasty  07/14/2011    Procedure: COMPUTER ASSISTED TOTAL KNEE ARTHROPLASTY;  Surgeon: Cammy CopaGregory Scott Dean, MD;  [facility];  Service: Orthopedics;  Laterality: Left;  Left total knee arthroplasty    History   Social History  . Marital Status: Married    Spouse Name: N/A    Number of Children: N/A  . Years of Education: N/A   Occupational History  . Insurance    Social History Main Topics  . Smoking status: Current Every Day Smoker -- 0.10 packs/day for 6 years  . Smokeless tobacco: Not on file  . Alcohol Use: Yes     Comment: once drink every few months  . Drug Use: No  . Sexual Activity: Not on file   Other Topics Concern  . Not on file   Social History Narrative    Current Outpatient Prescriptions on File Prior to Visit  Medication Sig Dispense Refill    . Ascorbic Acid (VITAMIN C) 1000 MG tablet Take 1,000 mg by mouth daily.    . Biotin (BIOTIN 5000) 5 MG CAPS Take 1 capsule by mouth daily.    . Canagliflozin (INVOKANA) 300 MG TABS Take 1 tablet (300 mg total) by mouth daily. 30 tablet 11  . cyclobenzaprine (FLEXERIL) 10 MG tablet Take 1 tablet (10 mg total) by mouth 2 (two) times daily as needed for muscle spasms. 20 tablet 0  . Liraglutide 18 MG/3ML SOPN Inject 1.8 mg into the skin daily.    . Multiple Vitamin (MULTIVITAMIN WITH MINERALS) TABS tablet Take 1 tablet by mouth daily.    Marland Kitchen. olmesartan (BENICAR) 20 MG tablet Take 20 mg by mouth daily.    . pioglitazone (ACTOS) 30 MG tablet Take 30 mg by mouth daily. TAKE 1 TABLET BY MOUTH DAILY    . promethazine (PHENERGAN) 25 MG tablet Take 25 mg by mouth every 6 (six) hours as needed for nausea.     . repaglinide (PRANDIN) 2 MG tablet Take 2 mg by mouth 3 (three) times daily before meals.    . simvastatin (ZOCOR) 40 MG tablet Take 40 mg by mouth at bedtime.      No current facility-administered medications on file prior to visit.    Allergies  Allergen Reactions  .  Codeine Sulfate Nausea And Vomiting    REACTION: dizziness  . Metformin Diarrhea    REACTION: fatigue, diarrhea    Family History  Problem Relation Age of Onset  . Diabetes Mother   . Diabetes Father   . Anesthesia problems Neg Hx     BP 132/86 mmHg  Pulse 83  Temp(Src) 98.2 F (36.8 C) (Oral)  Ht 5\' 6"  (1.676 m)  Wt 202 lb (91.627 kg)  BMI 32.62 kg/m2  SpO2 95%    Review of Systems She denies hypoglycemia and edema.      Objective:   Physical Exam VITAL SIGNS:  See vs page GENERAL: no distress Pulses: dorsalis pedis intact bilat.  Feet: no deformity, except the right 2nd toe is shorter, due to surgery in the past. feet are of normal color and temp. no edema. Old healed surgical scar on the dorsal aspect of the right foot.  Skin: no ulcer on the feet.   Neuro: sensation is intact to touch on the feet.    Lab Results  Component Value Date   HGBA1C 7.8* 02/20/2014   Lab Results  Component Value Date   CHOL 194 02/20/2014   HDL 61.10 02/20/2014   LDLCALC 76 03/18/2010   LDLDIRECT 95.7 02/20/2014   TRIG 248.0* 02/20/2014   CHOLHDL 3 02/20/2014      Assessment & Plan:  DM: mild exacerbation.  We discussed insulin.  She says if she needs insulin, she would be willing to take multiple daily injections.  Noncompliance with meds: I'll work around this as best I can Dyslipidemia: well-controlled.   Patient is advised the following: Patient Instructions  Please make a follow-up appointment in 4 months.   check your blood sugar 1 time a day.  vary the time of day when you check, between before the 3 meals, and at bedtime.  also check if you have symptoms of your blood sugar being too high or too low.  please keep a record of the readings and bring it to your next appointment here.  please call us sooner if you are having low blood sugar episodes.   blood tests are being requested for you today.  We'll contact you with results.   If it is high, we can re-add the "bromocriptine."

## 2014-02-21 ENCOUNTER — Other Ambulatory Visit: Payer: Self-pay

## 2014-02-21 LAB — LDL CHOLESTEROL, DIRECT: Direct LDL: 95.7 mg/dL

## 2014-02-21 LAB — LIPID PANEL
Cholesterol: 194 mg/dL (ref 0–200)
HDL: 61.1 mg/dL (ref >39.00)
NONHDL: 132.9
Total CHOL/HDL Ratio: 3
Triglycerides: 248 mg/dL — ABNORMAL HIGH (ref 0.0–149.0)
VLDL: 49.6 mg/dL — ABNORMAL HIGH (ref 0.0–40.0)

## 2014-02-21 MED ORDER — BROMOCRIPTINE MESYLATE 2.5 MG PO TABS: 1.2500 mg | ORAL_TABLET | Freq: Every day | ORAL | Status: DC

## 2014-02-21 MED ORDER — GLUCOSE BLOOD VI STRP: ORAL_STRIP | Status: DC

## 2014-02-23 ENCOUNTER — Encounter: Payer: 59 | Admitting: Dietician

## 2014-02-23 ENCOUNTER — Encounter: Payer: Self-pay | Admitting: Dietician

## 2014-02-23 DIAGNOSIS — E119 Type 2 diabetes mellitus without complications: Secondary | ICD-10-CM

## 2014-02-23 NOTE — Progress Notes (Signed)
Medical Nutrition Therapy:  Appt start time: 1015 end time:  1115.   Assessment:  Primary concerns today: Chelsea SpryGail was referred today for blood sugar management.  Her most recent HgA1c was 7.8%.  She is here today for a refresher on carbohydrate counting.  She works for [employer].  Gets up at 5am, works from [employer]9am-5pm.  She states her trouble is eating in the evening.  She has family history of diabetes and has seen what complications can arise from not being well controlled.  She has started an exercise routine and seen success with weight loss (12 lbs), and states that the next step is for her to start eating better.  She had diabetes education and worked with a dietitian when she was first diagnosed with DM years ago.  She is starting to check her blood sugars again on a daily basis and plans to keep a food log as well.  Learning Readiness:  Ready  Change in progress  MEDICATIONS: see list   DIETARY INTAKE:  Usual eating pattern includes 3 meals and 2-3 snacks per day.  24-hr recall:  B (9:30-10 AM): yogurt or breakfast cookies, fruit, occasionally a breakfast biscuit  Snk ( AM): white grapes  L ( PM): packs sandwich with ham or bologna on white wheat with cheese, mayo, mustard or can of progresso soup Snk ( PM): sometimes white grapes or potato chips D ( PM): baked or broiled chicken (fried when out to eat), or salmon, potatoes or rice, frozen veggies-broccolli, mixed veg, greens Snk (9 PM): banana popscicle or creamsicle or popcorn Beverages: water  Usual physical activity: works out 3 days/week with a Systems analystpersonal trainer for 1 hour  Estimated energy needs: 1400-1600 calories 150-180 g carbohydrates 100-120 g protein  Progress Towards Goal(s):  In progress.   Nutritional Diagnosis:  NB-1.1 Food and nutrition-related knowledge deficit As related to no recent carbohydrate counting practice/education.  As evidenced by patient report and statement "I need to know which foods and how  much to eat," and HgA1c of 7.8%.    Intervention:  Nutrition education for managing blood glucose with diet and lifestyle changes.  Described the role of different macronutrients on glucose.  Explained how carbohydrates affect blood glucose.  Stated what foods contain the most carbohydrates.  Demonstrated carbohydrate counting.  Demonstrated how to read Nutrition Facts food label.  Recommended 2-3 carb choices (30-45 grams) per meal for weight loss goals, and 1-2 carb choices (15-30 grams) per snack.  Practiced building meals with food models that fit into her recommended plan.  Stated importance that each meal is separate from the others, and stated importance of regular meals and snack times.  Praised patient for the changes she has made so far and for making her health a priority.  Discussed relationship between diabetes and heart disease.  Provided heart healthy food recommendations such as choosing lean proteins, baking and grilling vs. frying, and using plant-based fats.  Stated importance of fiber for health and listed foods high in fiber.  Utilized MyPlate to demonstrate healthy, balanced meals.  Patient was engaged and did very well teaching back the material covered.  Expect good outcomes.  Together we came up with the following goals.  Goals:  Follow Diabetes Meal Plan as instructed  Eat 3 meals and 2 snacks, every 3-5 hrs  Limit carbohydrate intake to 30-45 grams (2-3 choices) carbohydrate/meal  Limit carbohydrate intake to 15-30 grams (1-2 choices) carbohydrate/snack  Add lean protein foods to meals/snacks  Choose foods high  in fiber  Monitor glucose levels as instructed by your doctor  Keep up the physical activity routine you have started  Teaching Method Utilized: Visual Auditory Hands on  Handouts given during visit include:  MyPlate portion method  Yellow Card meal planner  Low Carb snack suggestions  Nutrition facts food label  Barriers to learning/adherence to  lifestyle change: none  Demonstrated degree of understanding via:  Teach Back   Monitoring/Evaluation:  Dietary intake, exercise, labs, and body weight prn.

## 2014-06-02 ENCOUNTER — Other Ambulatory Visit: Payer: Self-pay | Admitting: Endocrinology

## 2014-06-05 ENCOUNTER — Telehealth: Payer: Self-pay | Admitting: Endocrinology

## 2014-06-05 NOTE — Telephone Encounter (Signed)
[phone] Melanie at pt pharmacy calling to get clarification on how many of the novotwist needles she will be using daily

## 2014-06-05 NOTE — Telephone Encounter (Signed)
Pharmacy notified pt is going to be using 1 needle per day.

## 2014-06-20 ENCOUNTER — Encounter: Payer: Self-pay | Admitting: Endocrinology

## 2014-06-20 ENCOUNTER — Ambulatory Visit (INDEPENDENT_AMBULATORY_CARE_PROVIDER_SITE_OTHER): Payer: Self-pay | Admitting: Endocrinology

## 2014-06-20 ENCOUNTER — Ambulatory Visit: Payer: 59 | Admitting: Endocrinology

## 2014-06-20 VITALS — BP 140/80 | HR 92 | Temp 98.1°F | Ht 66.0 in | Wt 201.0 lb

## 2014-06-20 DIAGNOSIS — E119 Type 2 diabetes mellitus without complications: Secondary | ICD-10-CM

## 2014-06-20 LAB — BASIC METABOLIC PANEL
BUN: 22 mg/dL (ref 6–23)
CHLORIDE: 107 meq/L (ref 96–112)
CO2: 26 meq/L (ref 19–32)
Calcium: 9.4 mg/dL (ref 8.4–10.5)
Creatinine, Ser: 0.77 mg/dL (ref 0.40–1.20)
GFR: 98.41 mL/min (ref >60.00)
Glucose, Bld: 136 mg/dL — ABNORMAL HIGH (ref 70–99)
POTASSIUM: 4.3 meq/L (ref 3.5–5.1)
Sodium: 140 mEq/L (ref 135–145)

## 2014-06-20 LAB — MICROALBUMIN / CREATININE URINE RATIO
CREATININE, U: 124.3 mg/dL
MICROALB/CREAT RATIO: 2.2 mg/g (ref 0.0–30.0)
Microalb, Ur: 2.7 mg/dL — ABNORMAL HIGH (ref 0.0–1.9)

## 2014-06-20 LAB — HEMOGLOBIN A1C: Hgb A1c MFr Bld: 7.8 % — ABNORMAL HIGH (ref 4.6–6.5)

## 2014-06-20 MED ORDER — PIOGLITAZONE HCL 45 MG PO TABS: 45.0000 mg | ORAL_TABLET | Freq: Every day | ORAL | Status: DC

## 2014-06-20 MED ORDER — BROMOCRIPTINE MESYLATE 2.5 MG PO TABS: 2.5000 mg | ORAL_TABLET | Freq: Every day | ORAL | Status: DC

## 2014-06-20 NOTE — Patient Instructions (Addendum)
Please make a follow-up appointment in 4 months.  check your blood sugar 1 time a day.  vary the time of day when you check, between before the 3 meals, and at bedtime.  also check if you have symptoms of your blood sugar being too high or too low.  please keep a record of the readings and bring it to your next appointment here.  please call us sooner if you are having low blood sugar episodes.   blood and urine tests are being requested for you today.  We'll contact you with results.   If it is high, we can increase the bromocriptine and pioglitizone.  The other option is to take insulin (which would be 3 times a day (just before each meal), just like the repaglinide).

## 2014-06-20 NOTE — Progress Notes (Signed)
Subjective:    Patient ID: Chelsea Gray, female    DOB: 07/31/1955, 59 y.o.   MRN: [id number]  HPI Pt returns for f/u of diabetes mellitus: DM type: 2 Dx'ed: 2006 Complications: none Therapy: victoza and 4 oral meds GDM: never DKA: never Severe hypoglycemia: never Pancreatitis: never.  Other: metformin caused generalized weakness; insurance declined welchol.   Interval history: pt states she feels well in general.  no cbg record, but states cbg's are in the low to mid-100's.  Past Medical History  Diagnosis Date  . DM 01/24/2009  . HYPERCHOLESTEROLEMIA 01/24/2009  . DEPRESSION 01/24/2009  . HYPERTENSION 01/24/2009  . Pulmonary embolism     result of blood transfusion reaction 18 years ago after childbirth  . Blood transfusion     d/t hemmorrhage post c- section  . 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 Osteoarthritis   . History of one miscarriage     Past Surgical History  Procedure Laterality Date  . Cesarean section    . Cervical cerclage    . Breast reduction surgery    . Breast cyst excision    . Knee arthroplasty  07/14/2011    Procedure: COMPUTER ASSISTED TOTAL KNEE ARTHROPLASTY;  Surgeon: Cammy CopaGregory Scott Dean, MD;  [facility];  Service: Orthopedics;  Laterality: Left;  Left total knee arthroplasty  . Joint replacement      History   Social History  . Marital Status: Married    Spouse Name: N/A  . Number of Children: N/A  . Years of Education: N/A   Occupational History  . Insurance    Social History Main Topics  . Smoking status: Current Every Day Smoker -- 0.10 packs/day for 6 years  . Smokeless tobacco: Not on file  . Alcohol Use: Yes     Comment: once drink every few months  . Drug Use: No  . Sexual Activity: Not on file   Other Topics Concern  . Not on file   Social History Narrative    Current Outpatient Prescriptions on File Prior to Visit  Medication Sig Dispense Refill  .  Ascorbic Acid (VITAMIN C) 1000 MG tablet Take 1,000 mg by mouth daily.    . Biotin (BIOTIN 5000) 5 MG CAPS Take 1 capsule by mouth daily.    . Canagliflozin (INVOKANA) 300 MG TABS Take 1 tablet (300 mg total) by mouth daily. 30 tablet 11  . glucose blood (FREESTYLE LITE) test strip Use to check blood sugar 1 per day. 100 each 2  . Liraglutide 18 MG/3ML SOPN Inject 1.8 mg into the skin daily.    . Multiple Vitamin (MULTIVITAMIN WITH MINERALS) TABS tablet Take 1 tablet by mouth daily.    Marland Kitchen. NOVOTWIST 32G X 5 MM MISC USE AS DIRECTED 100 each 1  . olmesartan (BENICAR) 20 MG tablet Take 20 mg by mouth daily.    . promethazine (PHENERGAN) 25 MG tablet Take 25 mg by mouth every 6 (six) hours as needed for nausea.     . repaglinide (PRANDIN) 2 MG tablet Take 2 mg by mouth 3 (three) times daily before meals.    . simvastatin (ZOCOR) 40 MG tablet Take 40 mg by mouth at bedtime.     . cyclobenzaprine (FLEXERIL) 10 MG tablet Take 1 tablet (10 mg total) by mouth 2 (two) times daily as needed for muscle spasms. (Patient not taking: Reported on 06/20/2014) 20 tablet 0   No  current facility-administered medications on file prior to visit.    Allergies  Allergen Reactions  . Codeine Sulfate Nausea And Vomiting    REACTION: dizziness  . Metformin Diarrhea    REACTION: fatigue, diarrhea    Family History  Problem Relation Age of Onset  . Diabetes Mother   . Diabetes Father   . Anesthesia problems Neg Hx     BP 140/80 mmHg  Pulse 92  Temp(Src) 98.1 F (36.7 C) (Oral)  Ht  (1.676 m)  Wt 201 lb (91.173 kg)  BMI 32.46 kg/m2  SpO2 95%  Review of Systems She denies hypoglycemia.       Objective:   Physical Exam VITAL SIGNS:  See vs page GENERAL: no distress Pulses: dorsalis pedis intact bilat.  Feet: no deformity, except the right 2nd toe is shorter, due to surgery in the past. feet are of normal color and temp. no edema. Old healed surgical scar on the dorsal aspect of the right foot.    Skin: no ulcer on the feet.   Neuro: sensation is intact to touch on the feet.    Lab Results  Component Value Date   HGBA1C 7.8* 06/20/2014      Assessment & Plan:  DM: stable.  She needs increased rx   Patient is advised the following: Patient Instructions  Please make a follow-up appointment in 4 months.  check your blood sugar 1 time a day.  vary the time of day when you check, between before the 3 meals, and at bedtime.  also check if you have symptoms of your blood sugar being too high or too low.  please keep a record of the readings and bring it to your next appointment here.  please call us sooner if you are having low blood sugar episodes.   blood and urine tests are being requested for you today.  We'll contact you with results.   If it is high, we can increase the bromocriptine and pioglitizone.  The other option is to take insulin (which would be 3 times a day (just before each meal), just like the repaglinide).   addendum: increase the bromocriptine and pioglitizone.

## 2014-09-07 ENCOUNTER — Other Ambulatory Visit (HOSPITAL_COMMUNITY)
Admission: RE | Admit: 2014-09-07 | Discharge: 2014-09-07 | Disposition: A | Discharge status: Home / Self Care | Payer: 59 | Source: Ambulatory Visit | Attending: Family Medicine | Admitting: Family Medicine

## 2014-09-07 ENCOUNTER — Other Ambulatory Visit: Payer: Self-pay | Admitting: Family Medicine

## 2014-09-07 DIAGNOSIS — Z1151 Encounter for screening for human papillomavirus (HPV): Secondary | ICD-10-CM | POA: Insufficient documentation

## 2014-09-07 DIAGNOSIS — Z124 Encounter for screening for malignant neoplasm of cervix: Secondary | ICD-10-CM | POA: Insufficient documentation

## 2014-09-11 LAB — CYTOLOGY - PAP

## 2014-10-15 ENCOUNTER — Other Ambulatory Visit: Payer: Self-pay | Admitting: Endocrinology

## 2014-10-22 ENCOUNTER — Ambulatory Visit: Payer: 59 | Admitting: Endocrinology

## 2014-10-27 ENCOUNTER — Other Ambulatory Visit: Payer: Self-pay | Admitting: Endocrinology

## 2014-11-05 ENCOUNTER — Other Ambulatory Visit: Payer: Self-pay | Admitting: Endocrinology

## 2014-11-15 ENCOUNTER — Ambulatory Visit (INDEPENDENT_AMBULATORY_CARE_PROVIDER_SITE_OTHER): Payer: 59 | Admitting: Endocrinology

## 2014-11-15 ENCOUNTER — Encounter: Payer: Self-pay | Admitting: Endocrinology

## 2014-11-15 VITALS — BP 132/80 | HR 102 | Temp 98.1°F | Ht 66.0 in | Wt 198.0 lb

## 2014-11-15 DIAGNOSIS — E119 Type 2 diabetes mellitus without complications: Secondary | ICD-10-CM | POA: Diagnosis not present

## 2014-11-15 LAB — BASIC METABOLIC PANEL
BUN: 24 mg/dL — ABNORMAL HIGH (ref 6–23)
CHLORIDE: 103 meq/L (ref 96–112)
CO2: 27 mEq/L (ref 19–32)
Calcium: 9.8 mg/dL (ref 8.4–10.5)
Creatinine, Ser: 0.73 mg/dL (ref 0.40–1.20)
GFR: 104.52 mL/min (ref >60.00)
Glucose, Bld: 113 mg/dL — ABNORMAL HIGH (ref 70–99)
POTASSIUM: 3.9 meq/L (ref 3.5–5.1)
Sodium: 139 mEq/L (ref 135–145)

## 2014-11-15 LAB — HEMOGLOBIN A1C: Hgb A1c MFr Bld: 7.4 % — ABNORMAL HIGH (ref 4.6–6.5)

## 2014-11-15 MED ORDER — GLIMEPIRIDE 1 MG PO TABS: 1.0000 mg | ORAL_TABLET | Freq: Every day | ORAL | Status: DC

## 2014-11-15 NOTE — Progress Notes (Signed)
Subjective:    Patient ID: Chelsea Gray, female    DOB: 1955-10-25, 59 y.o.   MRN: [id number]  HPI Pt returns for f/u of diabetes mellitus: DM type: 2 Dx'ed: 2006 Complications: none Therapy: victoza and 4 oral meds GDM: never DKA: never Severe hypoglycemia: never Pancreatitis: never.  Other: metformin caused generalized weakness and diarrhea; insurance declined welchol.   Interval history: pt states she feels well in general.  no cbg record, but states cbg's are in the mid-100's.  She did not tolerate the prandin (diarrhea).   Past Medical History  Diagnosis Date  . DM 01/24/2009  . HYPERCHOLESTEROLEMIA 01/24/2009  . DEPRESSION 01/24/2009  . HYPERTENSION 01/24/2009  . Pulmonary embolism     result of blood transfusion reaction 18 years ago after childbirth  . Blood transfusion     d/t hemmorrhage post c- section  . 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 Osteoarthritis   . History of one miscarriage     Past Surgical History  Procedure Laterality Date  . Cesarean section    . Cervical cerclage    . Breast reduction surgery    . Breast cyst excision    . Knee arthroplasty  07/14/2011    Procedure: COMPUTER ASSISTED TOTAL KNEE ARTHROPLASTY;  Surgeon: Cammy Copa, MD;  Location: [facility] OR;  Service: Orthopedics;  Laterality: Left;  Left total knee arthroplasty  . Joint replacement      Social History   Social History  . Marital Status: Married    Spouse Name: N/A  . Number of Children: N/A  . Years of Education: N/A   Occupational History  . Insurance    Social History Main Topics  . Smoking status: Current Every Day Smoker -- 0.10 packs/day for 6 years  . Smokeless tobacco: Not on file  . Alcohol Use: Yes     Comment: once drink every few months  . Drug Use: No  . Sexual Activity: Not on file   Other Topics Concern  . Not on file   Social History Narrative    Current Outpatient Prescriptions on  File Prior to Visit  Medication Sig Dispense Refill  . Ascorbic Acid (VITAMIN C) 1000 MG tablet Take 1,000 mg by mouth daily.    . Biotin (BIOTIN 5000) 5 MG CAPS Take 1 capsule by mouth daily.    . bromocriptine (PARLODEL) 2.5 MG tablet Take 1 tablet (2.5 mg total) by mouth at bedtime. 30 tablet 11  . glucose blood (FREESTYLE LITE) test strip Use to check blood sugar 1 per day. 100 each 2  . INVOKANA 300 MG TABS tablet TAKE 1 TABLET EVERY DAY 30 tablet 10  . Liraglutide 18 MG/3ML SOPN Inject 1.8 mg into the skin daily.    . Multiple Vitamin (MULTIVITAMIN WITH MINERALS) TABS tablet Take 1 tablet by mouth daily.    Marland Kitchen NOVOTWIST 32G X 5 MM MISC USE AS DIRECTED 100 each 1  . olmesartan (BENICAR) 20 MG tablet Take 20 mg by mouth daily.    . pioglitazone (ACTOS) 45 MG tablet Take 1 tablet (45 mg total) by mouth daily. 30 tablet 11  . simvastatin (ZOCOR) 40 MG tablet Take 40 mg by mouth at bedtime.     . promethazine (PHENERGAN) 25 MG tablet Take 25 mg by mouth every 6 (six) hours as needed for nausea.      No current facility-administered medications on file prior to visit.  Allergies  Allergen Reactions  . Codeine Sulfate Nausea And Vomiting    REACTION: dizziness  . Metformin Diarrhea    REACTION: fatigue, diarrhea    Family History  Problem Relation Age of Onset  . Diabetes Mother   . Diabetes Father   . Anesthesia problems Neg Hx     BP 132/80 mmHg  Pulse 102  Temp(Src) 98.1 F (36.7 C) (Oral)  Ht  (1.676 m)  Wt 198 lb (89.812 kg)  BMI 31.97 kg/m2  SpO2 96%  Review of Systems She denies hypoglycemia.     Objective:   Physical Exam VITAL SIGNS:  See vs page GENERAL: no distress Pulses: dorsalis pedis intact bilat.   MSK: no deformity of the feet, except a few toes are shorter (due to surgery for hammer toes). CV: no leg edema Skin:  no ulcer on the feet.  normal color and temp on the feet. Neuro: sensation is intact to touch on the feet   Lab Results    Component Value Date   HGBA1C 7.4* 11/15/2014      Assessment & Plan:  DM: she needs increased rx, if it can be done with a regimen that avoids or minimizes hypoglycemia. i have sent a prescription to your pharmacy, to add "glimepiride."    Patient is advised the following: Patient Instructions  Please make a follow-up appointment in 4 months.  check your blood sugar 1 time a day.  vary the time of day when you check, between before the 3 meals, and at bedtime.  also check if you have symptoms of your blood sugar being too high or too low.  please keep a record of the readings and bring it to your next appointment here.  please call us sooner if you are having low blood sugar episodes.   blood tests are requested for you today.  We'll let you know about the results. Based on the results, i'll send in a prescription for glimepiride.  We can also re-try the metformin (extended-release). If you need insulin the 3 times a day is considered to be a better option than once per day.

## 2014-11-15 NOTE — Patient Instructions (Addendum)
Please make a follow-up appointment in 4 months.  check your blood sugar 1 time a day.  vary the time of day when you check, between before the 3 meals, and at bedtime.  also check if you have symptoms of your blood sugar being too high or too low.  please keep a record of the readings and bring it to your next appointment here.  please call us sooner if you are having low blood sugar episodes.   blood tests are requested for you today.  We'll let you know about the results. Based on the results, i'll send in a prescription for glimepiride.  We can also re-try the metformin (extended-release). If you need insulin the 3 times a day is considered to be a better option than once per day.

## 2015-03-18 ENCOUNTER — Encounter: Payer: Self-pay | Admitting: Endocrinology

## 2015-03-18 ENCOUNTER — Ambulatory Visit (INDEPENDENT_AMBULATORY_CARE_PROVIDER_SITE_OTHER): Payer: 59 | Admitting: Endocrinology

## 2015-03-18 VITALS — BP 134/82 | HR 78 | Ht 66.0 in | Wt 207.0 lb

## 2015-03-18 DIAGNOSIS — E119 Type 2 diabetes mellitus without complications: Secondary | ICD-10-CM | POA: Diagnosis not present

## 2015-03-18 LAB — POCT GLYCOSYLATED HEMOGLOBIN (HGB A1C): Hemoglobin A1C: 6.4

## 2015-03-18 MED ORDER — GLIMEPIRIDE 1 MG PO TABS: 0.5000 mg | ORAL_TABLET | Freq: Every day | ORAL | Status: DC

## 2015-03-18 NOTE — Progress Notes (Signed)
Subjective:    Patient ID: Chelsea Gray, female    DOB: 23-Feb-1956, 59 y.o.   MRN: [id number]  HPI Pt returns for f/u of diabetes mellitus: DM type: 2 Dx'ed: 2006 Complications: none Therapy: victoza and 4 oral meds GDM: never DKA: never Severe hypoglycemia: never Pancreatitis: never.  Other: metformin caused generalized weakness and diarrhea; insurance declined welchol;   She did not tolerate the prandin (diarrhea).   Interval history: She seldom has hypoglycemia, and these episodes are mild.  pt states she feels well in general. Past Medical History  Diagnosis Date  . DM 01/24/2009  . HYPERCHOLESTEROLEMIA 01/24/2009  . DEPRESSION 01/24/2009  . HYPERTENSION 01/24/2009  . Pulmonary embolism (HCC)     result of blood transfusion reaction 18 years ago after childbirth  . Blood transfusion     d/t hemmorrhage post c- section  . 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 Osteoarthritis   . History of one miscarriage     Past Surgical History  Procedure Laterality Date  . Cesarean section    . Cervical cerclage    . Breast reduction surgery    . Breast cyst excision    . Knee arthroplasty  07/14/2011    Procedure: COMPUTER ASSISTED TOTAL KNEE ARTHROPLASTY;  Surgeon: Cammy Copa, MD;  Location: [facility] OR;  Service: Orthopedics;  Laterality: Left;  Left total knee arthroplasty  . Joint replacement      Social History   Social History  . Marital Status: Married    Spouse Name: N/A  . Number of Children: N/A  . Years of Education: N/A   Occupational History  . Insurance    Social History Main Topics  . Smoking status: Current Every Day Smoker -- 0.10 packs/day for 6 years  . Smokeless tobacco: Not on file  . Alcohol Use: Yes     Comment: once drink every few months  . Drug Use: No  . Sexual Activity: Not on file   Other Topics Concern  . Not on file   Social History Narrative    Current Outpatient  Prescriptions on File Prior to Visit  Medication Sig Dispense Refill  . Ascorbic Acid (VITAMIN C) 1000 MG tablet Take 1,000 mg by mouth daily.    . Biotin (BIOTIN 5000) 5 MG CAPS Take 1 capsule by mouth daily.    . bromocriptine (PARLODEL) 2.5 MG tablet Take 1 tablet (2.5 mg total) by mouth at bedtime. 30 tablet 11  . glucose blood (FREESTYLE LITE) test strip Use to check blood sugar 1 per day. 100 each 2  . INVOKANA 300 MG TABS tablet TAKE 1 TABLET EVERY DAY 30 tablet 10  . Liraglutide 18 MG/3ML SOPN Inject 1.8 mg into the skin daily.    . Multiple Vitamin (MULTIVITAMIN WITH MINERALS) TABS tablet Take 1 tablet by mouth daily.    Marland Kitchen NOVOTWIST 32G X 5 MM MISC USE AS DIRECTED 100 each 1  . olmesartan (BENICAR) 20 MG tablet Take 20 mg by mouth daily.    . pioglitazone (ACTOS) 45 MG tablet Take 1 tablet (45 mg total) by mouth daily. 30 tablet 11  . simvastatin (ZOCOR) 40 MG tablet Take 40 mg by mouth at bedtime.      No current facility-administered medications on file prior to visit.    Allergies  Allergen Reactions  . Codeine Sulfate Nausea And Vomiting    REACTION: dizziness  . Metformin Diarrhea  REACTION: fatigue, diarrhea    Family History  Problem Relation Age of Onset  . Diabetes Mother   . Diabetes Father   . Anesthesia problems Neg Hx     BP 134/82 mmHg  Pulse 78  Ht 5\' 6"  (1.676 m)  Wt 207 lb (93.895 kg)  BMI 33.43 kg/m2  SpO2 97%   Review of Systems No LOC    Objective:   Physical Exam VITAL SIGNS:  See vs page GENERAL: no distress VITAL SIGNS: See vs page GENERAL: no distress Pulses: dorsalis pedis intact bilat.  MSK: no deformity of the feet, except a few toes are shorter (due to surgery for hammer toes). CV: no leg edema Skin: no ulcer on the feet. normal color and temp on the feet. Neuro: sensation is intact to touch on the feet   A1c=6.4%    Assessment & Plan:  DM: overcontrolled, given this sulfonylurea-containing regimen  Patient is  advised the following: Patient Instructions  Please reduce the glimepiride to a half pill each morning Please make a follow-up appointment in 6 months.  check your blood sugar 1 time a day.  vary the time of day when you check, between before the 3 meals, and at bedtime.  also check if you have symptoms of your blood sugar being too high or too low.  please keep a record of the readings and bring it to your next appointment here.  please call us sooner if you are having low blood sugar episodes.

## 2015-03-18 NOTE — Patient Instructions (Signed)
Please reduce the glimepiride to a half pill each morning Please make a follow-up appointment in 6 months.  check your blood sugar 1 time a day.  vary the time of day when you check, between before the 3 meals, and at bedtime.  also check if you have symptoms of your blood sugar being too high or too low.  please keep a record of the readings and bring it to your next appointment here.  please call us sooner if you are having low blood sugar episodes.

## 2015-03-19 ENCOUNTER — Other Ambulatory Visit: Payer: Self-pay

## 2015-03-19 DIAGNOSIS — Z1231 Encounter for screening mammogram for malignant neoplasm of breast: Secondary | ICD-10-CM

## 2015-03-19 DIAGNOSIS — Z9889 Other specified postprocedural states: Secondary | ICD-10-CM

## 2015-03-25 ENCOUNTER — Ambulatory Visit
Admission: RE | Admit: 2015-03-25 | Discharge: 2015-03-25 | Disposition: A | Discharge status: Home / Self Care | Payer: 59 | Source: Ambulatory Visit

## 2015-03-25 DIAGNOSIS — Z9889 Other specified postprocedural states: Secondary | ICD-10-CM

## 2015-03-25 DIAGNOSIS — Z1231 Encounter for screening mammogram for malignant neoplasm of breast: Secondary | ICD-10-CM

## 2015-03-27 ENCOUNTER — Other Ambulatory Visit (HOSPITAL_COMMUNITY)
Admission: RE | Admit: 2015-03-27 | Discharge: 2015-03-27 | Disposition: A | Discharge status: Home / Self Care | Payer: 59 | Source: Ambulatory Visit | Attending: Family Medicine | Admitting: Family Medicine

## 2015-03-27 ENCOUNTER — Other Ambulatory Visit: Payer: Self-pay | Admitting: Family Medicine

## 2015-03-27 DIAGNOSIS — Z01411 Encounter for gynecological examination (general) (routine) with abnormal findings: Secondary | ICD-10-CM | POA: Diagnosis present

## 2015-03-27 DIAGNOSIS — Z1151 Encounter for screening for human papillomavirus (HPV): Secondary | ICD-10-CM | POA: Diagnosis present

## 2015-03-29 LAB — CYTOLOGY - PAP

## 2015-05-21 ENCOUNTER — Other Ambulatory Visit: Payer: Self-pay | Admitting: Endocrinology

## 2015-06-19 ENCOUNTER — Other Ambulatory Visit: Payer: Self-pay | Admitting: Endocrinology

## 2015-07-07 ENCOUNTER — Other Ambulatory Visit: Payer: Self-pay | Admitting: Endocrinology

## 2015-07-31 ENCOUNTER — Other Ambulatory Visit: Payer: Self-pay | Admitting: Endocrinology

## 2015-08-27 ENCOUNTER — Other Ambulatory Visit: Payer: Self-pay | Admitting: Endocrinology

## 2015-09-16 ENCOUNTER — Ambulatory Visit (INDEPENDENT_AMBULATORY_CARE_PROVIDER_SITE_OTHER): Payer: 59 | Admitting: Endocrinology

## 2015-09-16 ENCOUNTER — Encounter: Payer: Self-pay | Admitting: Endocrinology

## 2015-09-16 VITALS — BP 132/78 | HR 79 | Temp 98.3°F | Ht 66.0 in | Wt 216.2 lb

## 2015-09-16 DIAGNOSIS — E119 Type 2 diabetes mellitus without complications: Secondary | ICD-10-CM | POA: Diagnosis not present

## 2015-09-16 LAB — POCT GLYCOSYLATED HEMOGLOBIN (HGB A1C): HEMOGLOBIN A1C: 6.1

## 2015-09-16 MED ORDER — DULAGLUTIDE 1.5 MG/0.5ML ~~LOC~~ SOAJ: 1.5000 mg | SUBCUTANEOUS | Status: DC

## 2015-09-16 NOTE — Progress Notes (Signed)
Pre visit review using our clinic tool,if applicable. No additional management support is needed unless otherwise documented below in the visit note.  

## 2015-09-16 NOTE — Patient Instructions (Addendum)
Please reduce the glimepiride to a half pill each morning i have sent a prescription to your pharmacy, to change victoza to trulicity.  Please make a follow-up appointment in 6 months.  check your blood sugar 1 time a day.  vary the time of day when you check, between before the 3 meals, and at bedtime.  also check if you have symptoms of your blood sugar being too high or too low.  please keep a record of the readings and bring it to your next appointment here.  please call us sooner if you are having low blood sugar episodes.

## 2015-09-16 NOTE — Progress Notes (Signed)
Subjective:    Patient ID: Chelsea Gray, female    DOB: Mar 12, 1956, 60 y.o.   MRN: [id number]  HPI Pt returns for f/u of diabetes mellitus: DM type: 2 Dx'ed: 2006 Complications: none Therapy: victoza and 4 oral meds GDM: never DKA: never Severe hypoglycemia: never Pancreatitis: never.  Other: metformin caused generalized weakness and diarrhea; insurance declined welchol; she did not tolerate prandin (diarrhea).   Interval history: pt states she feels well in general, except for allergy sxs.  She still takes amaryl, 1 mg qd.  She wants to see if trulicity is cheaper than victoza.  Past Medical History  Diagnosis Date  . DM 01/24/2009  . HYPERCHOLESTEROLEMIA 01/24/2009  . DEPRESSION 01/24/2009  . HYPERTENSION 01/24/2009  . Pulmonary embolism (HCC)     result of blood transfusion reaction 18 years ago after childbirth  . Blood transfusion     d/t hemmorrhage post c- section  . 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 Osteoarthritis   . History of one miscarriage     Past Surgical History  Procedure Laterality Date  . Cesarean section    . Cervical cerclage    . Breast reduction surgery    . Breast cyst excision    . Knee arthroplasty  07/14/2011    Procedure: COMPUTER ASSISTED TOTAL KNEE ARTHROPLASTY;  Surgeon: Cammy Copa, MD;  Location: [facility] OR;  Service: Orthopedics;  Laterality: Left;  Left total knee arthroplasty  . Joint replacement      Social History   Social History  . Marital Status: Married    Spouse Name: N/A  . Number of Children: N/A  . Years of Education: N/A   Occupational History  . Insurance    Social History Main Topics  . Smoking status: Current Every Day Smoker -- 0.10 packs/day for 6 years  . Smokeless tobacco: Not on file  . Alcohol Use: Yes     Comment: once drink every few months  . Drug Use: No  . Sexual Activity: Not on file   Other Topics Concern  . Not on file   Social  History Narrative    Current Outpatient Prescriptions on File Prior to Visit  Medication Sig Dispense Refill  . Ascorbic Acid (VITAMIN C) 1000 MG tablet Take 1,000 mg by mouth daily.    . Biotin (BIOTIN 5000) 5 MG CAPS Take 1 capsule by mouth daily.    . bromocriptine (PARLODEL) 2.5 MG tablet Take 1 tablet (2.5 mg total) by mouth at bedtime. 30 tablet 11  . FREESTYLE LITE test strip CHECK BLOOD SUGAR ONCE A DAY 100 each 2  . glimepiride (AMARYL) 1 MG tablet Take 0.5 tablets (0.5 mg total) by mouth daily with breakfast. 45 tablet 3  . INVOKANA 300 MG TABS tablet TAKE 1 TABLET EVERY DAY 30 tablet 10  . Multiple Vitamin (MULTIVITAMIN WITH MINERALS) TABS tablet Take 1 tablet by mouth daily.    Marland Kitchen NOVOTWIST 32G X 5 MM MISC USE DAILY FOR INJECTION 100 each 1  . olmesartan (BENICAR) 20 MG tablet Take 20 mg by mouth daily.    . pioglitazone (ACTOS) 45 MG tablet TAKE 1 TABLET (45 MG TOTAL) BY MOUTH DAILY. 30 tablet 8  . simvastatin (ZOCOR) 40 MG tablet Take 40 mg by mouth at bedtime.      No current facility-administered medications on file prior to visit.    Allergies  Allergen Reactions  . Codeine  Sulfate Nausea And Vomiting    REACTION: dizziness  . Metformin Diarrhea    REACTION: fatigue, diarrhea    Family History  Problem Relation Age of Onset  . Diabetes Mother   . Diabetes Father   . Anesthesia problems Neg Hx     BP 132/78 mmHg  Pulse 79  Temp(Src) 98.3 F (36.8 C) (Oral)  Ht 5\' 6"  (1.676 m)  Wt 216 lb 3.2 oz (98.068 kg)  BMI 34.91 kg/m2  SpO2 98%    Review of Systems She denies hypoglycemia    Objective:   Physical Exam VITAL SIGNS:  See vs page GENERAL: no distress Pulses: dorsalis pedis intact bilat.   MSK: no deformity of the feet CV: no leg edema Skin:  no ulcer on the feet.  normal color and temp on the feet. Neuro: sensation is intact to touch on the feet   A1c=6.1%    Assessment & Plan:  Type 2 DM: overcontrolled, for this SU-containing  regimen.  Patient is advised the following: Patient Instructions  Please reduce the glimepiride to a half pill each morning i have sent a prescription to your pharmacy, to change victoza to trulicity.  Please make a follow-up appointment in 6 months.  check your blood sugar 1 time a day.  vary the time of day when you check, between before the 3 meals, and at bedtime.  also check if you have symptoms of your blood sugar being too high or too low.  please keep a record of the readings and bring it to your next appointment here.  please call us sooner if you are having low blood sugar episodes.     Romero BellingELLISON, Claudy Abdallah, MD

## 2015-11-07 ENCOUNTER — Other Ambulatory Visit: Payer: Self-pay | Admitting: Endocrinology

## 2015-11-22 ENCOUNTER — Other Ambulatory Visit: Payer: Self-pay | Admitting: Endocrinology

## 2016-02-16 ENCOUNTER — Encounter (HOSPITAL_COMMUNITY): Payer: Self-pay | Admitting: *Deleted

## 2016-02-16 ENCOUNTER — Emergency Department (HOSPITAL_COMMUNITY)
Admission: EM | Admit: 2016-02-16 | Discharge: 2016-02-16 | Disposition: A | Discharge status: Home / Self Care | Payer: 59 | Attending: Emergency Medicine | Admitting: Emergency Medicine

## 2016-02-16 ENCOUNTER — Emergency Department (HOSPITAL_COMMUNITY): Payer: 59

## 2016-02-16 DIAGNOSIS — Z7984 Long term (current) use of oral hypoglycemic drugs: Secondary | ICD-10-CM | POA: Insufficient documentation

## 2016-02-16 DIAGNOSIS — R748 Abnormal levels of other serum enzymes: Secondary | ICD-10-CM | POA: Diagnosis not present

## 2016-02-16 DIAGNOSIS — E119 Type 2 diabetes mellitus without complications: Secondary | ICD-10-CM | POA: Diagnosis not present

## 2016-02-16 DIAGNOSIS — I10 Essential (primary) hypertension: Secondary | ICD-10-CM | POA: Diagnosis not present

## 2016-02-16 DIAGNOSIS — Z7982 Long term (current) use of aspirin: Secondary | ICD-10-CM | POA: Insufficient documentation

## 2016-02-16 DIAGNOSIS — K5900 Constipation, unspecified: Secondary | ICD-10-CM | POA: Insufficient documentation

## 2016-02-16 DIAGNOSIS — Z96652 Presence of left artificial knee joint: Secondary | ICD-10-CM | POA: Diagnosis not present

## 2016-02-16 DIAGNOSIS — R109 Unspecified abdominal pain: Secondary | ICD-10-CM

## 2016-02-16 DIAGNOSIS — R1031 Right lower quadrant pain: Secondary | ICD-10-CM | POA: Diagnosis present

## 2016-02-16 LAB — COMPREHENSIVE METABOLIC PANEL
ALBUMIN: 4.4 g/dL (ref 3.5–5.0)
ALK PHOS: 90 U/L (ref 38–126)
ALT: 20 U/L (ref 14–54)
AST: 30 U/L (ref 15–41)
Anion gap: 13 (ref 5–15)
BUN: 20 mg/dL (ref 6–20)
CHLORIDE: 99 mmol/L — AB (ref 101–111)
CO2: 29 mmol/L (ref 22–32)
CREATININE: 0.78 mg/dL (ref 0.44–1.00)
Calcium: 9.6 mg/dL (ref 8.9–10.3)
GFR calc non Af Amer: >60 mL/min (ref >60)
GLUCOSE: 126 mg/dL — AB (ref 65–99)
Potassium: 3.5 mmol/L (ref 3.5–5.1)
SODIUM: 141 mmol/L (ref 135–145)
Total Bilirubin: 0.3 mg/dL (ref 0.3–1.2)
Total Protein: 8 g/dL (ref 6.5–8.1)

## 2016-02-16 LAB — URINALYSIS, ROUTINE W REFLEX MICROSCOPIC
BILIRUBIN URINE: NEGATIVE
HGB URINE DIPSTICK: NEGATIVE
Ketones, ur: NEGATIVE mg/dL
Leukocytes, UA: NEGATIVE
Nitrite: NEGATIVE
PROTEIN: 30 mg/dL — AB
Specific Gravity, Urine: 1.036 — ABNORMAL HIGH (ref 1.005–1.030)
pH: 8 (ref 5.0–8.0)

## 2016-02-16 LAB — URINE MICROSCOPIC-ADD ON

## 2016-02-16 LAB — CBC
HCT: 43.8 % (ref 36.0–46.0)
Hemoglobin: 14.4 g/dL (ref 12.0–15.0)
MCH: 29.8 pg (ref 26.0–34.0)
MCHC: 32.9 g/dL (ref 30.0–36.0)
MCV: 90.7 fL (ref 78.0–100.0)
PLATELETS: 276 10*3/uL (ref 150–400)
RBC: 4.83 MIL/uL (ref 3.87–5.11)
RDW: 15.5 % (ref 11.5–15.5)
WBC: 9.8 10*3/uL (ref 4.0–10.5)

## 2016-02-16 LAB — LIPASE, BLOOD: LIPASE: 54 U/L — AB (ref 11–51)

## 2016-02-16 MED ORDER — HYDROMORPHONE HCL 2 MG/ML IJ SOLN
1.0000 mg | Freq: Once | INTRAMUSCULAR | Status: AC
  Administered 2016-02-16: 1 mg via INTRAVENOUS
  Filled 2016-02-16: qty 1

## 2016-02-16 MED ORDER — ONDANSETRON HCL 4 MG/2ML IJ SOLN
4.0000 mg | Freq: Once | INTRAMUSCULAR | Status: AC
  Administered 2016-02-16: 4 mg via INTRAVENOUS
  Filled 2016-02-16: qty 2

## 2016-02-16 MED ORDER — SODIUM CHLORIDE 0.9 % IV BOLUS (SEPSIS)
1000.0000 mL | Freq: Once | INTRAVENOUS | Status: AC
  Administered 2016-02-16: 1000 mL via INTRAVENOUS

## 2016-02-16 MED ORDER — IOPAMIDOL (ISOVUE-300) INJECTION 61%
INTRAVENOUS | Status: AC
  Administered 2016-02-16: 100 mL
  Filled 2016-02-16: qty 100

## 2016-02-16 NOTE — ED Notes (Signed)
ED Provider at bedside. 

## 2016-02-16 NOTE — ED Notes (Signed)
Patient transported to CT 

## 2016-02-16 NOTE — ED Triage Notes (Signed)
Pt reports onset of RLQ pain on Friday night. Went to [facility] today and was called and told to come here today due to intestinal blockage. Received pain medicine at pcp today. Reports last bm was yesterday. Having nausea, no vomiting.

## 2016-02-16 NOTE — ED Notes (Signed)
Pt back from CT

## 2016-02-16 NOTE — Discharge Instructions (Signed)
Your lipase which is a chemical associated with your pancreas gland was slightly elevated at 54. This can be rechecked by your primary care physician. Increase fluid, fruit, fiber

## 2016-02-16 NOTE — ED Provider Notes (Signed)
[facility]-EMERGENCY DEPT Provider Note   CSN: [id number] Arrival date & time: 02/16/16  1558     History   Chief Complaint Chief Complaint  Patient presents with  . Abdominal Pain    HPI Chelsea Gray is a 60 y.o. female.  Generalized abdominal pain especially in right lower quadrant since Friday night. She was seen by a physician at  [facility] today and was told to come to the ED secondary to a "blockage". Surgical history includes a cesarean section. Last BM yesterday.  No fever, sweats, chills, vomiting, diarrhea. Severity of symptoms is moderate.      Past Medical History:  Diagnosis Date  . Blood transfusion    d/t hemmorrhage post c- section  . DEPRESSION 01/24/2009  . DM 01/24/2009  . 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 History of one miscarriage   . HYPERCHOLESTEROLEMIA 01/24/2009  . HYPERTENSION 01/24/2009  . Osteoarthritis   . Pulmonary embolism (HCC)    result of blood transfusion reaction 18 years ago after childbirth    Patient Active Problem List   Diagnosis Date Noted  . Abdominal pain, unspecified site 12/24/2010  . MYALGIA 06/19/2009  . NUMBNESS 06/19/2009  . Diabetes (HCC) 01/24/2009  . HYPERCHOLESTEROLEMIA 01/24/2009  . SMOKER 01/24/2009  . DEPRESSION 01/24/2009  . HYPERTENSION 01/24/2009  . CHEST PAIN 01/24/2009    Past Surgical History:  Procedure Laterality Date  . BREAST CYST EXCISION    . BREAST REDUCTION SURGERY    . CERVICAL CERCLAGE    . CESAREAN SECTION    . JOINT REPLACEMENT    . KNEE ARTHROPLASTY  07/14/2011   Procedure: COMPUTER ASSISTED TOTAL KNEE ARTHROPLASTY;  Surgeon: Cammy CopaGregory Scott Dean, MD;  [facility];  Service: Orthopedics;  Laterality: Left;  Left total knee arthroplasty    OB History    No data available       Home Medications    Prior to Admission medications   Medication Sig Start Date End Date Taking? Authorizing Provider  aspirin-sod bicarb-citric acid  (ALKA-SELTZER) 325 MG TBEF tablet Take 650 mg by mouth 2 (two) times daily as needed (indigestion/ stomach pain).   Yes Historical Provider, MD  cycloSPORINE (RESTASIS) 0.05 % ophthalmic emulsion Place 1 drop into both eyes 2 (two) times daily.   Yes Historical Provider, MD  Diclofenac Sodium (PENNSAID) 2 % SOLN Place onto the skin See admin instructions. Apply 2 pumps twice daily to hand as needed for arthritis pain   Yes Historical Provider, MD  Dulaglutide (TRULICITY) 1.5 MG/0.5ML SOPN Inject 1.5 mg into the skin once a week. Patient taking differently: Inject 1.5 mg into the skin every Monday.  09/16/15  Yes Romero BellingSean Ellison, MD  glimepiride (AMARYL) 1 MG tablet TAKE 1/2 TABLET BY MOUTH DAILY WITH BREAKFAST 11/22/15  Yes Romero BellingSean Ellison, MD  INVOKANA 300 MG TABS tablet TAKE 1 TABLET EVERY DAY 11/07/15  Yes Romero BellingSean Ellison, MD  meloxicam (MOBIC) 15 MG tablet Take 15 mg by mouth daily with breakfast. 02/03/16  Yes Historical Provider, MD  olmesartan (BENICAR) 20 MG tablet Take 20 mg by mouth daily.   Yes Historical Provider, MD  pioglitazone (ACTOS) 45 MG tablet TAKE 1 TABLET (45 MG TOTAL) BY MOUTH DAILY. 07/08/15  Yes Romero BellingSean Ellison, MD  simvastatin (ZOCOR) 40 MG tablet Take 40 mg by mouth at bedtime.    Yes Historical Provider, MD  bromocriptine (PARLODEL) 2.5 MG tablet Take 1 tablet (2.5 mg total) by  mouth at bedtime. Patient not taking: Reported on 02/16/2016 06/20/14   Romero Belling, MD  FREESTYLE LITE test strip CHECK BLOOD SUGAR ONCE A DAY 08/27/15   Romero Belling, MD  NOVOTWIST 32G X 5 MM MISC USE DAILY FOR INJECTION 05/21/15   Romero Belling, MD    Family History Family History  Problem Relation Age of Onset  . Diabetes Mother   . Diabetes Father   . Anesthesia problems Neg Hx     Social History Social History  Substance Use Topics  . Smoking status: Current Every Day Smoker    Packs/day: 0.10    Years: 6.00  . Smokeless tobacco: Not on file  . Alcohol use Yes     Comment: once drink every few  months     Allergies   Codeine sulfate and Metformin   Review of Systems Review of Systems  All other systems reviewed and are negative.    Physical Exam Updated Vital Signs BP 139/87   Pulse 81   Temp 97.8 F (36.6 C) (Oral)   Resp 16   SpO2 92%   Physical Exam  Constitutional: She is oriented to person, place, and time. She appears well-developed and well-nourished.  HENT:  Head: Normocephalic and atraumatic.  Eyes: Conjunctivae are normal.  Neck: Neck supple.  Cardiovascular: Normal rate and regular rhythm.   Pulmonary/Chest: Effort normal and breath sounds normal.  Abdominal:  Tender RLQ  Musculoskeletal: Normal range of motion.  Neurological: She is alert and oriented to person, place, and time.  Skin: Skin is warm and dry.  Psychiatric: She has a normal mood and affect. Her behavior is normal.  Nursing note and vitals reviewed.    ED Treatments / Results  Labs (all labs ordered are listed, but only abnormal results are displayed) Labs Reviewed  LIPASE, BLOOD - Abnormal; Notable for the following:       Result Value   Lipase 54 (*)    All other components within normal limits  COMPREHENSIVE METABOLIC PANEL - Abnormal; Notable for the following:    Chloride 99 (*)    Glucose, Bld 126 (*)    All other components within normal limits  URINALYSIS, ROUTINE W REFLEX MICROSCOPIC (NOT AT [facility]) - Abnormal; Notable for the following:    Color, Urine AMBER (*)    APPearance TURBID (*)    Specific Gravity, Urine 1.036 (*)    Glucose, UA >1000 (*)    Protein, ur 30 (*)    All other components within normal limits  URINE MICROSCOPIC-ADD ON - Abnormal; Notable for the following:    Squamous Epithelial / LPF 0-5 (*)    Bacteria, UA MANY (*)    Crystals TRIPLE PHOSPHATE CRYSTALS (*)    All other components within normal limits  CBC    EKG  EKG Interpretation None       Radiology Ct Abdomen Pelvis W Contrast  Result Date: 02/16/2016 CLINICAL DATA:   Right lower quadrant abdominal pain and nausea for 3 days. EXAM: CT ABDOMEN AND PELVIS WITH CONTRAST TECHNIQUE: Multidetector CT imaging of the abdomen and pelvis was performed using the standard protocol following bolus administration of intravenous contrast. CONTRAST:  100 ISOVUE-300 IOPAMIDOL (ISOVUE-300) INJECTION 61% COMPARISON:  Radiograph 02/16/2016, CT of the abdomen 12/26/2010. FINDINGS: Lower chest: Patchy airspace consolidation in the dependent portion of both lower lobes. Hepatobiliary: Hepatic steatosis. No focal liver abnormality is seen. No gallstones, gallbladder wall thickening, or biliary dilatation. Pancreas: Unremarkable. No pancreatic ductal dilatation or surrounding inflammatory  changes. Spleen: Normal in size without focal abnormality. Adrenals/Urinary Tract: Adrenal glands are unremarkable. Kidneys are normal, without renal calculi, focal lesion, or hydronephrosis. Bladder is unremarkable. Stomach/Bowel: Stomach is within normal limits. Appendix appears normal. No evidence of bowel wall thickening, distention, or inflammatory changes. Low attenuation of the colonic contents may be seen with diarrheal state. Vascular/Lymphatic: Aortic atherosclerosis. No enlarged abdominal or pelvic lymph nodes. Reproductive: Uterus and bilateral adnexa are unremarkable. Other: No abdominal wall hernia or abnormality. No abdominopelvic ascites. Musculoskeletal: No acute or significant osseous findings. IMPRESSION: Patchy airspace consolidation versus atelectasis in the dependent portions of the bilateral lower lobes of the lungs. Is the patient at risk of aspiration? Hepatic steatosis. Low attenuation of the colonic contents may be seen with diarrheal state. Electronically Signed   By: Ted Mcalpineobrinka  Dimitrova M.D.   On: 02/16/2016 20:09    Procedures Procedures (including critical care time)  Medications Ordered in ED Medications  ondansetron (ZOFRAN) injection 4 mg (4 mg Intravenous Given 02/16/16 1700)    HYDROmorphone (DILAUDID) injection 1 mg (1 mg Intravenous Given 02/16/16 1700)  sodium chloride 0.9 % bolus 1,000 mL (0 mLs Intravenous Stopped 02/16/16 1805)  HYDROmorphone (DILAUDID) injection 1 mg (1 mg Intravenous Given 02/16/16 1841)  iopamidol (ISOVUE-300) 61 % injection (100 mLs  Contrast Given 02/16/16 1933)     Initial Impression / Assessment and Plan / ED Course  I have reviewed the triage vital signs and the nursing notes.  Pertinent labs & imaging results that were available during my care of the patient were reviewed by me and considered in my medical decision making (see chart for details).  Clinical Course     No acute abdomen. Patient felt much better after a large bowel movement. CT scan reviewed with the patient and her husband. I discussed the slightly elevated lipase. She has primary care follow-up.  Final Clinical Impressions(s) / ED Diagnoses   Final diagnoses:  Abdominal pain, unspecified abdominal location  Elevated lipase  Constipation, unspecified constipation type    New Prescriptions Discharge Medication List as of 02/16/2016  9:45 PM       Donnetta HutchingBrian Akua Blethen, MD 02/16/16 2216

## 2016-02-16 NOTE — ED Notes (Signed)
Called CT - pt 3rd on list for scan

## 2016-03-15 NOTE — Progress Notes (Signed)
Subjective:    Patient ID: Chelsea Gray, female    DOB: 08/08/1955, 60 y.o.   MRN: [id number]  HPI Pt returns for f/u of diabetes mellitus: DM type: 2 Dx'ed: 2006 Complications: none Therapy: victoza and 4 oral meds GDM: never.  DKA: never Severe hypoglycemia: never.  Pancreatitis: never.  Other: metformin caused generalized weakness and diarrhea; insurance declined welchol; she did not tolerate prandin (diarrhea).   Interval history: pt states she feels well in general, except for intermittent mild hypoglycemia.  She takes meds as rx'ed. Past Medical History:  Diagnosis Date  . Blood transfusion    d/t hemmorrhage post c- section  . DEPRESSION 01/24/2009  . DM 01/24/2009  . 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 History of one miscarriage   . HYPERCHOLESTEROLEMIA 01/24/2009  . HYPERTENSION 01/24/2009  . Osteoarthritis   . Pulmonary embolism (HCC)    result of blood transfusion reaction 18 years ago after childbirth    Past Surgical History:  Procedure Laterality Date  . BREAST CYST EXCISION    . BREAST REDUCTION SURGERY    . CERVICAL CERCLAGE    . CESAREAN SECTION    . JOINT REPLACEMENT    . KNEE ARTHROPLASTY  07/14/2011   Procedure: COMPUTER ASSISTED TOTAL KNEE ARTHROPLASTY;  Surgeon: Cammy CopaGregory Scott Dean, MD;  [facility];  Service: Orthopedics;  Laterality: Left;  Left total knee arthroplasty    Social History   Social History  . Marital status: Married    Spouse name: N/A  . Number of children: N/A  . 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 Smoking status: Current Every Day Smoker    Packs/day: 0.10    Years: 6.00  . Smokeless tobacco: Not on file  . Alcohol use Yes     Comment: once drink every few months  . Drug use: No  . Sexual activity: Not on file   Other Topics Concern  . Not on file   Social History Narrative  . No  narrative on file    Current Outpatient Prescriptions on File Prior to Visit  Medication Sig Dispense Refill  . bromocriptine (PARLODEL) 2.5 MG tablet Take 1 tablet (2.5 mg total) by mouth at bedtime. 30 tablet 11  . Diclofenac Sodium (PENNSAID) 2 % SOLN Place onto the skin See admin instructions. Apply 2 pumps twice daily to hand as needed for arthritis pain    . Dulaglutide (TRULICITY) 1.5 MG/0.5ML SOPN Inject 1.5 mg into the skin once a week. (Patient taking differently: Inject 1.5 mg into the skin every Monday. ) 4 pen 11  . FREESTYLE LITE test strip CHECK BLOOD SUGAR ONCE A DAY 100 each 2  . INVOKANA 300 MG TABS tablet TAKE 1 TABLET EVERY DAY 30 tablet 6  . meloxicam (MOBIC) 15 MG tablet Take 15 mg by mouth daily with breakfast.  3  . NOVOTWIST 32G X 5 MM MISC USE DAILY FOR INJECTION 100 each 1  . olmesartan (BENICAR) 20 MG tablet Take 20 mg by mouth daily.    . pioglitazone (ACTOS) 45 MG tablet TAKE 1 TABLET (45 MG TOTAL) BY MOUTH DAILY. 30 tablet 8  . simvastatin (ZOCOR) 40 MG tablet Take 40 mg by mouth at bedtime.     . cycloSPORINE (RESTASIS) 0.05 % ophthalmic emulsion Place 1 drop into both eyes 2 (two) times daily.  No current facility-administered medications on file prior to visit.     Allergies  Allergen Reactions  . Codeine Sulfate Nausea And Vomiting and Other (See Comments)    dizziness  . Metformin Diarrhea and Other (See Comments)    fatigue    Family History  Problem Relation Age of Onset  . Diabetes Mother   . Diabetes Father   . Anesthesia problems Neg Hx     BP (!) 142/82   Pulse 78   Ht 5\' 6"  (1.676 m)   Wt 215 lb (97.5 kg)   SpO2 96%   BMI 34.70 kg/m     Review of Systems Denies LOC.  She has constipation    Objective:   Physical Exam VITAL SIGNS:  See vs page GENERAL: no distress Pulses: dorsalis pedis intact bilat.   MSK: no deformity of the feet CV: no leg edema Skin:  no ulcer on the feet.  normal color and temp on the  feet. Neuro: sensation is intact to touch on the feet.    A1c=6.2%     Assessment & Plan:  Type 2 DM: overcontrolled, for this SU-containing regimen.  Patient is advised the following: Patient Instructions  Please make a follow-up appointment in 4 months.  Please change the glimepiride to metformin, 1/2 pill per day.  I have sent a prescription to your pharmacy Please continue the same other diabetes medications.   check your blood sugar 1 time a day.  vary the time of day when you check, between before the 3 meals, and at bedtime.  also check if you have symptoms of your blood sugar being too high or too low.  please keep a record of the readings and bring it to your next appointment here.  please call us sooner if you are having low blood sugar episodes.

## 2016-03-17 ENCOUNTER — Ambulatory Visit (INDEPENDENT_AMBULATORY_CARE_PROVIDER_SITE_OTHER): Payer: 59 | Admitting: Endocrinology

## 2016-03-17 ENCOUNTER — Other Ambulatory Visit: Payer: Self-pay | Admitting: Family Medicine

## 2016-03-17 ENCOUNTER — Encounter: Payer: Self-pay | Admitting: Endocrinology

## 2016-03-17 VITALS — BP 142/82 | HR 78 | Ht 66.0 in | Wt 215.0 lb

## 2016-03-17 DIAGNOSIS — Z23 Encounter for immunization: Secondary | ICD-10-CM | POA: Diagnosis not present

## 2016-03-17 DIAGNOSIS — Z1231 Encounter for screening mammogram for malignant neoplasm of breast: Secondary | ICD-10-CM

## 2016-03-17 DIAGNOSIS — E119 Type 2 diabetes mellitus without complications: Secondary | ICD-10-CM | POA: Diagnosis not present

## 2016-03-17 LAB — POCT GLYCOSYLATED HEMOGLOBIN (HGB A1C): Hemoglobin A1C: 6.2

## 2016-03-17 MED ORDER — METFORMIN HCL 500 MG PO TABS: 250.0000 mg | ORAL_TABLET | Freq: Every day | ORAL | 11 refills | Status: DC

## 2016-03-17 NOTE — Patient Instructions (Addendum)
Please make a follow-up appointment in 4 months.  Please change the glimepiride to metformin, 1/2 pill per day.  I have sent a prescription to your pharmacy Please continue the same other diabetes medications.   check your blood sugar 1 time a day.  vary the time of day when you check, between before the 3 meals, and at bedtime.  also check if you have symptoms of your blood sugar being too high or too low.  please keep a record of the readings and bring it to your next appointment here.  please call us sooner if you are having low blood sugar episodes.

## 2016-04-02 ENCOUNTER — Other Ambulatory Visit: Payer: Self-pay | Admitting: Endocrinology

## 2016-04-03 ENCOUNTER — Ambulatory Visit
Admission: RE | Admit: 2016-04-03 | Discharge: 2016-04-03 | Disposition: A | Discharge status: Home / Self Care | Payer: 59 | Source: Ambulatory Visit | Attending: Family Medicine | Admitting: Family Medicine

## 2016-04-03 DIAGNOSIS — Z1231 Encounter for screening mammogram for malignant neoplasm of breast: Secondary | ICD-10-CM

## 2016-04-07 DIAGNOSIS — H66019 Acute suppurative otitis media with spontaneous rupture of ear drum, unspecified ear: Secondary | ICD-10-CM | POA: Diagnosis not present

## 2016-04-07 DIAGNOSIS — J069 Acute upper respiratory infection, unspecified: Secondary | ICD-10-CM | POA: Diagnosis not present

## 2016-04-09 DIAGNOSIS — H6690 Otitis media, unspecified, unspecified ear: Secondary | ICD-10-CM | POA: Diagnosis not present

## 2016-04-09 DIAGNOSIS — B349 Viral infection, unspecified: Secondary | ICD-10-CM | POA: Diagnosis not present

## 2016-05-01 ENCOUNTER — Other Ambulatory Visit (HOSPITAL_COMMUNITY)
Admission: RE | Admit: 2016-05-01 | Discharge: 2016-05-01 | Disposition: A | Discharge status: Home / Self Care | Payer: 59 | Source: Ambulatory Visit | Attending: Family Medicine | Admitting: Family Medicine

## 2016-05-01 ENCOUNTER — Other Ambulatory Visit: Payer: Self-pay | Admitting: Family Medicine

## 2016-05-01 DIAGNOSIS — I10 Essential (primary) hypertension: Secondary | ICD-10-CM | POA: Diagnosis not present

## 2016-05-01 DIAGNOSIS — R896 Abnormal cytological findings in specimens from other organs, systems and tissues: Secondary | ICD-10-CM | POA: Insufficient documentation

## 2016-05-01 DIAGNOSIS — H6690 Otitis media, unspecified, unspecified ear: Secondary | ICD-10-CM | POA: Diagnosis not present

## 2016-05-06 LAB — CYTOLOGY - PAP: DIAGNOSIS: NEGATIVE

## 2016-05-19 DIAGNOSIS — J069 Acute upper respiratory infection, unspecified: Secondary | ICD-10-CM | POA: Diagnosis not present

## 2016-05-29 ENCOUNTER — Other Ambulatory Visit: Payer: Self-pay | Admitting: Endocrinology

## 2016-07-16 ENCOUNTER — Ambulatory Visit: Payer: 59 | Admitting: Endocrinology

## 2016-07-22 ENCOUNTER — Ambulatory Visit: Payer: 59 | Admitting: Endocrinology

## 2016-07-23 ENCOUNTER — Ambulatory Visit (INDEPENDENT_AMBULATORY_CARE_PROVIDER_SITE_OTHER): Payer: 59 | Admitting: Endocrinology

## 2016-07-23 VITALS — BP 130/82 | HR 68 | Ht 66.0 in | Wt 209.0 lb

## 2016-07-23 DIAGNOSIS — E119 Type 2 diabetes mellitus without complications: Secondary | ICD-10-CM

## 2016-07-23 LAB — POCT GLYCOSYLATED HEMOGLOBIN (HGB A1C): Hemoglobin A1C: 6.5

## 2016-07-23 NOTE — Patient Instructions (Addendum)
Please make a follow-up appointment in 4 months.  Please continue the same diabetes medications.   check your blood sugar once a day.  vary the time of day when you check, between before the 3 meals, and at bedtime.  also check if you have symptoms of your blood sugar being too high or too low.  please keep a record of the readings and bring it to your next appointment here.  please call us sooner if you are having low blood sugar episodes.

## 2016-07-23 NOTE — Progress Notes (Signed)
Subjective:    Patient ID: Chelsea Gray, female    DOB: February 26, 1956, 61 y.o.   MRN: [id number]  HPI Pt returns for f/u of diabetes mellitus: DM type: 2 Dx'ed: 2006 Complications: none Therapy: victoza and 3 oral meds GDM: never.  DKA: never Severe hypoglycemia: never.  Pancreatitis: never.  Other: metformin dosage is limited by weakness and diarrhea; insurance declined welchol; she did not tolerate prandin (diarrhea).   Interval history: pt states she feels well in general.  She says cbg's are well-controlled.  She takes meds as rx'ed.   Past Medical History:  Diagnosis Date  . Blood transfusion    d/t hemmorrhage post c- section  . DEPRESSION 01/24/2009  . DM 01/24/2009  . 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 History of one miscarriage   . HYPERCHOLESTEROLEMIA 01/24/2009  . HYPERTENSION 01/24/2009  . Osteoarthritis   . Pulmonary embolism (HCC)    result of blood transfusion reaction 18 years ago after childbirth    Past Surgical History:  Procedure Laterality Date  . BREAST CYST EXCISION    . BREAST REDUCTION SURGERY    . CERVICAL CERCLAGE    . CESAREAN SECTION    . JOINT REPLACEMENT    . KNEE ARTHROPLASTY  07/14/2011   Procedure: COMPUTER ASSISTED TOTAL KNEE ARTHROPLASTY;  Surgeon: Cammy Copa, MD;  Location: [facility] OR;  Service: Orthopedics;  Laterality: Left;  Left total knee arthroplasty    Social History   Social History  . Marital status: Married    Spouse name: N/A  . Number of children: N/A  . 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 Smoking status: Current Every Day Smoker    Packs/day: 0.10    Years: 6.00  . Smokeless tobacco: Not on file  . Alcohol use Yes     Comment: once drink every few months  . Drug use: No  . Sexual activity: Not on file   Other Topics Concern  . Not on file   Social History Narrative  . No  narrative on file    Current Outpatient Prescriptions on File Prior to Visit  Medication Sig Dispense Refill  . cycloSPORINE (RESTASIS) 0.05 % ophthalmic emulsion Place 1 drop into both eyes 2 (two) times daily.    . Diclofenac Sodium (PENNSAID) 2 % SOLN Place onto the skin See admin instructions. Apply 2 pumps twice daily to hand as needed for arthritis pain    . Dulaglutide (TRULICITY) 1.5 MG/0.5ML SOPN Inject 1.5 mg into the skin once a week. (Patient taking differently: Inject 1.5 mg into the skin every Monday. ) 4 pen 11  . FREESTYLE LITE test strip CHECK BLOOD SUGAR ONCE A DAY 100 each 2  . INVOKANA 300 MG TABS tablet TAKE 1 TABLET EVERY DAY 30 tablet 6  . meloxicam (MOBIC) 15 MG tablet Take 15 mg by mouth daily with breakfast.  3  . metFORMIN (GLUCOPHAGE) 500 MG tablet Take 0.5 tablets (250 mg total) by mouth daily with breakfast. 30 tablet 11  . NOVOTWIST 32G X 5 MM MISC USE DAILY FOR INJECTION (Patient not taking: Reported on 07/23/2016) 100 each 1  . olmesartan (BENICAR) 20 MG tablet Take 20 mg by mouth daily.    . pioglitazone (ACTOS) 45 MG tablet TAKE 1 TABLET (45 MG TOTAL) BY MOUTH DAILY. 30 tablet 8  . simvastatin (  ZOCOR) 40 MG tablet Take 40 mg by mouth at bedtime.      No current facility-administered medications on file prior to visit.     Allergies  Allergen Reactions  . Codeine Sulfate Nausea And Vomiting and Other (See Comments)    dizziness  . Metformin Diarrhea and Other (See Comments)    fatigue    Family History  Problem Relation Age of Onset  . Diabetes Mother   . Diabetes Father   . Anesthesia problems Neg Hx     BP 130/82   Pulse 68   Ht  (1.676 m)   Wt 209 lb (94.8 kg)   SpO2 98%   BMI 33.73 kg/m   Review of Systems She denies hypoglycemia.    Objective:   Physical Exam VITAL SIGNS:  See vs page GENERAL: no distress Pulses: dorsalis pedis intact bilat.   MSK: no deformity of the feet CV: no leg edema Skin:  no ulcer on the feet.   normal color and temp on the feet. Neuro: sensation is intact to touch on the feet.       Assessment & Plan:  Type 2 DM: well-controlled.   Diarrhea, by hx.  This limits rx options.  Patient Instructions  Please make a follow-up appointment in 4 months.  Please continue the same diabetes medications.   check your blood sugar once a day.  vary the time of day when you check, between before the 3 meals, and at bedtime.  also check if you have symptoms of your blood sugar being too high or too low.  please keep a record of the readings and bring it to your next appointment here.  please call us sooner if you are having low blood sugar episodes.

## 2016-08-11 DIAGNOSIS — M19041 Primary osteoarthritis, right hand: Secondary | ICD-10-CM | POA: Diagnosis not present

## 2016-08-11 DIAGNOSIS — M1812 Unilateral primary osteoarthritis of first carpometacarpal joint, left hand: Secondary | ICD-10-CM | POA: Diagnosis not present

## 2016-08-11 DIAGNOSIS — M19042 Primary osteoarthritis, left hand: Secondary | ICD-10-CM | POA: Diagnosis not present

## 2016-08-11 DIAGNOSIS — M79644 Pain in right finger(s): Secondary | ICD-10-CM | POA: Diagnosis not present

## 2016-08-26 DIAGNOSIS — M545 Low back pain: Secondary | ICD-10-CM | POA: Diagnosis not present

## 2016-09-15 DIAGNOSIS — M19049 Primary osteoarthritis, unspecified hand: Secondary | ICD-10-CM | POA: Diagnosis not present

## 2016-09-21 ENCOUNTER — Other Ambulatory Visit: Payer: Self-pay | Admitting: Endocrinology

## 2016-10-27 DIAGNOSIS — M19041 Primary osteoarthritis, right hand: Secondary | ICD-10-CM | POA: Diagnosis not present

## 2016-10-29 DIAGNOSIS — E1165 Type 2 diabetes mellitus with hyperglycemia: Secondary | ICD-10-CM | POA: Diagnosis not present

## 2016-10-29 DIAGNOSIS — E7800 Pure hypercholesterolemia, unspecified: Secondary | ICD-10-CM | POA: Diagnosis not present

## 2016-10-29 DIAGNOSIS — Z7984 Long term (current) use of oral hypoglycemic drugs: Secondary | ICD-10-CM | POA: Diagnosis not present

## 2016-10-29 DIAGNOSIS — Z0000 Encounter for general adult medical examination without abnormal findings: Secondary | ICD-10-CM | POA: Diagnosis not present

## 2016-11-09 DIAGNOSIS — H5203 Hypermetropia, bilateral: Secondary | ICD-10-CM | POA: Diagnosis not present

## 2016-11-23 ENCOUNTER — Ambulatory Visit: Payer: 59 | Admitting: Endocrinology

## 2016-12-15 ENCOUNTER — Encounter: Payer: Self-pay | Admitting: Endocrinology

## 2016-12-15 ENCOUNTER — Ambulatory Visit (INDEPENDENT_AMBULATORY_CARE_PROVIDER_SITE_OTHER): Payer: 59 | Admitting: Endocrinology

## 2016-12-15 VITALS — BP 138/68 | HR 75 | Wt 199.2 lb

## 2016-12-15 DIAGNOSIS — E119 Type 2 diabetes mellitus without complications: Secondary | ICD-10-CM | POA: Diagnosis not present

## 2016-12-15 LAB — POCT GLYCOSYLATED HEMOGLOBIN (HGB A1C): HEMOGLOBIN A1C: 6.8

## 2016-12-15 MED ORDER — METFORMIN HCL 500 MG PO TABS: 500.0000 mg | ORAL_TABLET | Freq: Two times a day (BID) | ORAL | 11 refills | Status: AC

## 2016-12-15 NOTE — Progress Notes (Signed)
Subjective:    Patient ID: Chelsea Gray, female    DOB: 07/13/1955, 61 y.o.   MRN: [id number]  HPI Pt returns for f/u of diabetes mellitus: DM type: 2 Dx'ed: 2006 Complications: none Therapy: victoza and 3 oral meds.  GDM: never.  DKA: never.  Severe hypoglycemia: never.  Pancreatitis: never.  Other: metformin dosage is limited by weakness and diarrhea; insurance declined welchol; she did not tolerate prandin (diarrhea).   Interval history: pt states she feels well in general.  She says cbg's are well-controlled.  She takes meds as rx'ed.  She says the metformin helps constipation.  She recently had 2 steroid injections.   Past Medical History:  Diagnosis Date  . Blood transfusion    d/t hemmorrhage post c- section  . DEPRESSION 01/24/2009  . DM 01/24/2009  . 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 History of one miscarriage   . HYPERCHOLESTEROLEMIA 01/24/2009  . HYPERTENSION 01/24/2009  . Osteoarthritis   . Pulmonary embolism (HCC)    result of blood transfusion reaction 18 years ago after childbirth    Past Surgical History:  Procedure Laterality Date  . BREAST CYST EXCISION    . BREAST REDUCTION SURGERY    . CERVICAL CERCLAGE    . CESAREAN SECTION    . JOINT REPLACEMENT    . KNEE ARTHROPLASTY  07/14/2011   Procedure: COMPUTER ASSISTED TOTAL KNEE ARTHROPLASTY;  Surgeon: Cammy CopaGregory Scott Dean, MD;  [facility];  Service: Orthopedics;  Laterality: Left;  Left total knee arthroplasty    Social History   Social History  . Marital status: Married    Spouse name: N/A  . Number of children: N/A  . 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 Smoking status: Current Every Day Smoker    Packs/day: 0.10    Years: 6.00  . Smokeless tobacco: Not on file  . Alcohol use Yes     Comment: once drink every few months  . Drug use: No  . Sexual activity: Not on  file   Other Topics Concern  . Not on file   Social History Narrative  . No narrative on file    Current Outpatient Prescriptions on File Prior to Visit  Medication Sig Dispense Refill  . cycloSPORINE (RESTASIS) 0.05 % ophthalmic emulsion Place 1 drop into both eyes 2 (two) times daily.    . Diclofenac Sodium (PENNSAID) 2 % SOLN Place onto the skin See admin instructions. Apply 2 pumps twice daily to hand as needed for arthritis pain    . FREESTYLE LITE test strip CHECK BLOOD SUGAR ONCE A DAY 100 each 2  . INVOKANA 300 MG TABS tablet TAKE 1 TABLET EVERY DAY 30 tablet 6  . NOVOTWIST 32G X 5 MM MISC USE DAILY FOR INJECTION 100 each 1  . olmesartan (BENICAR) 20 MG tablet Take 20 mg by mouth daily.    . pioglitazone (ACTOS) 45 MG tablet TAKE 1 TABLET (45 MG TOTAL) BY MOUTH DAILY. 30 tablet 8  . simvastatin (ZOCOR) 40 MG tablet Take 40 mg by mouth at bedtime.     . TRULICITY 1.5 MG/0.5ML SOPN INJECT 1.5 MG INTO THE SKIN ONCE A WEEK. 4 pen 11  . meloxicam (MOBIC) 15 MG tablet Take 15 mg by mouth daily with breakfast.  3   No current facility-administered medications on file prior to  visit.     Allergies  Allergen Reactions  . Codeine Sulfate Nausea And Vomiting and Other (See Comments)    dizziness  . Metformin Diarrhea and Other (See Comments)    fatigue    Family History  Problem Relation Age of Onset  . Diabetes Mother   . Diabetes Father   . Anesthesia problems Neg Hx     BP 138/68   Pulse 75   Wt 199 lb 3.2 oz (90.4 kg)   SpO2 98%   BMI 32.15 kg/m    Review of Systems She denies hypoglycemia.  She has lost 10 lbs, due to her efforts.     Objective:   Physical Exam VITAL SIGNS:  See vs page GENERAL: no distress Pulses: foot pulses are intact bilaterally.   MSK: no deformity of the feet or ankles.  CV: no edema of the legs or ankles Skin:  no ulcer on the feet or ankles.  normal color and temp on the feet and ankles Neuro: sensation is intact to touch on the  feet and ankles.    Lab Results  Component Value Date   HGBA1C 6.8 12/15/2016       Assessment & Plan:  Type 2 DM: worse. OA, new to me: steroid injections are affecting a1c  Patient Instructions  Please increase the metformin to 1 pill, twice a day.  check your blood sugar once a day.  vary the time of day when you check, between before the 3 meals, and at bedtime.  also check if you have symptoms of your blood sugar being too high or too low.  please keep a record of the readings and bring it to your next appointment here (or you can bring the meter itself).  You can write it on any piece of paper.  please call us sooner if your blood sugar goes below 70, or if you have a lot of readings over 200. I would be happy to see you back here as needed.

## 2016-12-15 NOTE — Patient Instructions (Addendum)
Please increase the metformin to 1 pill, twice a day.  check your blood sugar once a day.  vary the time of day when you check, between before the 3 meals, and at bedtime.  also check if you have symptoms of your blood sugar being too high or too low.  please keep a record of the readings and bring it to your next appointment here (or you can bring the meter itself).  You can write it on any piece of paper.  please call us sooner if your blood sugar goes below 70, or if you have a lot of readings over 200. I would be happy to see you back here as needed.

## 2016-12-22 ENCOUNTER — Other Ambulatory Visit: Payer: Self-pay | Admitting: Endocrinology

## 2017-01-05 ENCOUNTER — Other Ambulatory Visit: Payer: Self-pay | Admitting: Endocrinology

## 2017-01-14 DIAGNOSIS — H9312 Tinnitus, left ear: Secondary | ICD-10-CM | POA: Diagnosis not present

## 2017-01-14 DIAGNOSIS — H903 Sensorineural hearing loss, bilateral: Secondary | ICD-10-CM | POA: Diagnosis not present

## 2017-01-14 DIAGNOSIS — H6983 Other specified disorders of Eustachian tube, bilateral: Secondary | ICD-10-CM | POA: Diagnosis not present

## 2017-03-10 DIAGNOSIS — L732 Hidradenitis suppurativa: Secondary | ICD-10-CM | POA: Diagnosis not present

## 2017-03-10 DIAGNOSIS — L0390 Cellulitis, unspecified: Secondary | ICD-10-CM | POA: Diagnosis not present

## 2017-03-10 DIAGNOSIS — Z23 Encounter for immunization: Secondary | ICD-10-CM | POA: Diagnosis not present

## 2017-04-13 ENCOUNTER — Other Ambulatory Visit: Payer: Self-pay | Admitting: Family Medicine

## 2017-04-13 DIAGNOSIS — Z1231 Encounter for screening mammogram for malignant neoplasm of breast: Secondary | ICD-10-CM

## 2017-04-29 ENCOUNTER — Ambulatory Visit: Payer: 59

## 2017-05-03 DIAGNOSIS — E119 Type 2 diabetes mellitus without complications: Secondary | ICD-10-CM | POA: Diagnosis not present

## 2017-05-03 DIAGNOSIS — I10 Essential (primary) hypertension: Secondary | ICD-10-CM | POA: Diagnosis not present

## 2017-05-03 DIAGNOSIS — E7800 Pure hypercholesterolemia, unspecified: Secondary | ICD-10-CM | POA: Diagnosis not present

## 2017-05-04 ENCOUNTER — Ambulatory Visit
Admission: RE | Admit: 2017-05-04 | Discharge: 2017-05-04 | Disposition: A | Discharge status: Home / Self Care | Payer: 59 | Source: Ambulatory Visit | Attending: Family Medicine | Admitting: Family Medicine

## 2017-05-04 DIAGNOSIS — Z1231 Encounter for screening mammogram for malignant neoplasm of breast: Secondary | ICD-10-CM

## 2017-05-12 DIAGNOSIS — J069 Acute upper respiratory infection, unspecified: Secondary | ICD-10-CM | POA: Diagnosis not present

## 2017-07-06 ENCOUNTER — Other Ambulatory Visit: Payer: Self-pay | Admitting: Endocrinology

## 2017-10-02 ENCOUNTER — Other Ambulatory Visit: Payer: Self-pay | Admitting: Endocrinology

## 2017-10-27 ENCOUNTER — Ambulatory Visit (INDEPENDENT_AMBULATORY_CARE_PROVIDER_SITE_OTHER): Payer: Self-pay

## 2017-10-27 ENCOUNTER — Ambulatory Visit (INDEPENDENT_AMBULATORY_CARE_PROVIDER_SITE_OTHER): Payer: 59

## 2017-10-27 ENCOUNTER — Ambulatory Visit (INDEPENDENT_AMBULATORY_CARE_PROVIDER_SITE_OTHER): Payer: 59 | Admitting: Orthopedic Surgery

## 2017-10-27 ENCOUNTER — Encounter (INDEPENDENT_AMBULATORY_CARE_PROVIDER_SITE_OTHER): Payer: Self-pay | Admitting: Orthopedic Surgery

## 2017-10-27 DIAGNOSIS — M1712 Unilateral primary osteoarthritis, left knee: Secondary | ICD-10-CM | POA: Diagnosis not present

## 2017-10-27 DIAGNOSIS — M1711 Unilateral primary osteoarthritis, right knee: Secondary | ICD-10-CM

## 2017-10-27 DIAGNOSIS — G8929 Other chronic pain: Secondary | ICD-10-CM | POA: Diagnosis not present

## 2017-10-27 DIAGNOSIS — M25561 Pain in right knee: Secondary | ICD-10-CM | POA: Diagnosis not present

## 2017-10-27 DIAGNOSIS — M25562 Pain in left knee: Secondary | ICD-10-CM | POA: Diagnosis not present

## 2017-10-27 MED ORDER — MELOXICAM 15 MG PO TABS: 15.0000 mg | ORAL_TABLET | Freq: Every day | ORAL | 0 refills | Status: DC

## 2017-10-28 DIAGNOSIS — E119 Type 2 diabetes mellitus without complications: Secondary | ICD-10-CM | POA: Diagnosis not present

## 2017-10-29 ENCOUNTER — Encounter (INDEPENDENT_AMBULATORY_CARE_PROVIDER_SITE_OTHER): Payer: Self-pay | Admitting: Orthopedic Surgery

## 2017-10-29 DIAGNOSIS — M1711 Unilateral primary osteoarthritis, right knee: Secondary | ICD-10-CM | POA: Diagnosis not present

## 2017-10-29 DIAGNOSIS — M25562 Pain in left knee: Secondary | ICD-10-CM | POA: Diagnosis not present

## 2017-10-29 MED ORDER — METHYLPREDNISOLONE ACETATE 40 MG/ML IJ SUSP
40.0000 mg | INTRAMUSCULAR | Status: AC | PRN
  Administered 2017-10-29: 40 mg via INTRA_ARTICULAR

## 2017-10-29 MED ORDER — LIDOCAINE HCL 1 % IJ SOLN
5.0000 mL | INTRAMUSCULAR | Status: AC | PRN
  Administered 2017-10-29: 5 mL

## 2017-10-29 MED ORDER — BUPIVACAINE HCL 0.25 % IJ SOLN
4.0000 mL | INTRAMUSCULAR | Status: AC | PRN
  Administered 2017-10-29: 4 mL via INTRA_ARTICULAR

## 2017-10-29 NOTE — Progress Notes (Signed)
Office Visit Note   Patient: Chelsea Gray           Date of Birth: 08/10/1955           MRN: [id number] Visit Date: 10/27/2017 Requested by: Maurice Small, MD [address] [place], [place] [address] PCP: Maurice Small, MD  Subjective: Chief Complaint  Patient presents with  . Knee Pain    bilateral knee pain    HPI: Chelsea Gray is a patient with right knee pain.'s been going on for several months.  Denies any history of injury.  She states is getting worse.  She has a history of left total knee replacement.  She has had a little bit of pain in that area for several months.  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  She has been on Mobic which is helped.  She has fallen on that left knee on 2 occasions.              ROS: All systems reviewed are negative as they relate to the chief complaint within the history of present illness.  Patient denies  fevers or chills.   Assessment & Plan: Visit Diagnoses:  1. Chronic pain of both knees   2. Unilateral primary osteoarthritis, left knee   3. Unilateral primary osteoarthritis, right knee     Plan: Impression is worsening arthritis right knee with left knee which looks good on exam and radiographs.  Aspiration injection performed on the right knee.  Pen said sample provided for the left knee for topical tendinitis.  Mobic also prescribed.  I will see her back as needed  Follow-Up Instructions: Return if symptoms worsen or fail to improve.   Orders:  Orders Placed This Encounter  Procedures  . XR Knee 1-2 Views Right  . XR Knee 1-2 Views Left   Meds ordered this encounter  Medications  . meloxicam (MOBIC) 15 MG tablet    Sig: Take 1 tablet (15 mg total) by mouth daily.    Dispense:  30 tablet    Refill:  0      Procedures: Large Joint Inj: R knee on 10/29/2017 5:23 PM Indications: diagnostic evaluation, joint swelling and pain Details: 18 G 1.5 in needle, superolateral approach  Arthrogram:  No  Medications: 5 mL lidocaine 1 %; 40 mg methylPREDNISolone acetate 40 MG/ML; 4 mL bupivacaine 0.25 % Outcome: tolerated well, no immediate complications Procedure, treatment alternatives, risks and benefits explained, specific risks discussed. Consent was given by the patient. Immediately prior to procedure a time out was called to verify the correct patient, procedure, equipment, support staff and site/side marked as required. Patient was prepped and draped in the usual sterile fashion.       Clinical Data: No additional findings.  Objective: Vital Signs: There were no vitals taken for this visit.  Physical Exam:   Constitutional: Patient appears well-developed HEENT:  Head: Normocephalic Eyes:EOM are normal Neck: Normal range of motion Cardiovascular: Normal rate Pulmonary/chest: Effort normal Neurologic: Patient is alert Skin: Skin is warm Psychiatric: Patient has normal mood and affect    Ortho Exam: Orthopedic exam demonstrates normal gait alignment with palpable pedal pulses.  No groin pain with internal/external rotation of the leg.  Left knee demonstrates no effusion with excellent range of motion full extension to about 110 of flexion.  Mild tenderness over the patella but no apprehension and no effusion in the left knee.  Pedal pulses palpable.  No other masses lymphadenopathy or skin  changes noted in that knee region on the left.  On the right patient has an effusion.  Patient has stable collateral and cruciate ligaments.  Does have patellofemoral crepitus as well as medial and lateral joint line tenderness.  Specialty Comments:  No specialty comments available.  Imaging: No results found.   PMFS History: Patient Active Problem List   Diagnosis Date Noted  . Abdominal pain, unspecified site 12/24/2010  . MYALGIA 06/19/2009  . NUMBNESS 06/19/2009  . Diabetes (HCC) 01/24/2009  . HYPERCHOLESTEROLEMIA 01/24/2009  . SMOKER 01/24/2009  . DEPRESSION 01/24/2009   . HYPERTENSION 01/24/2009  . CHEST PAIN 01/24/2009   Past Medical History:  Diagnosis Date  . Blood transfusion    d/t hemmorrhage post c- section  . DEPRESSION 01/24/2009  . DM 01/24/2009  . 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 History of one miscarriage   . HYPERCHOLESTEROLEMIA 01/24/2009  . HYPERTENSION 01/24/2009  . Osteoarthritis   . Pulmonary embolism (HCC)    result of blood transfusion reaction 18 years ago after childbirth    Family History  Problem Relation Age of Onset  . Diabetes Mother   . Diabetes Father   . Breast cancer Sister   . Anesthesia problems Neg Hx     Past Surgical History:  Procedure Laterality Date  . BREAST CYST EXCISION    . BREAST REDUCTION SURGERY    . CERVICAL CERCLAGE    . CESAREAN SECTION    . JOINT REPLACEMENT    . KNEE ARTHROPLASTY  07/14/2011   Procedure: COMPUTER ASSISTED TOTAL KNEE ARTHROPLASTY;  Surgeon: Cammy CopaGregory Scott Kanai Berrios, MD;  [facility];  Service: Orthopedics;  Laterality: Left;  Left total knee arthroplasty  . REDUCTION MAMMAPLASTY Bilateral    Social History   Occupational History  . Occupation: Neurosurgeonnsurance    Employer: [employer]  Tobacco Use  . Smoking status: Current Every Day Smoker    Packs/day: 0.10    Years: 6.00    Pack years: 0.60  Substance and Sexual Activity  . Alcohol use: Yes    Comment: once drink every few months  . Drug use: No  . Sexual activity: Not on file

## 2017-11-15 DIAGNOSIS — Z0000 Encounter for general adult medical examination without abnormal findings: Secondary | ICD-10-CM | POA: Diagnosis not present

## 2017-11-15 DIAGNOSIS — E1169 Type 2 diabetes mellitus with other specified complication: Secondary | ICD-10-CM | POA: Diagnosis not present

## 2017-11-15 DIAGNOSIS — E7800 Pure hypercholesterolemia, unspecified: Secondary | ICD-10-CM | POA: Diagnosis not present

## 2017-11-15 DIAGNOSIS — I10 Essential (primary) hypertension: Secondary | ICD-10-CM | POA: Diagnosis not present

## 2017-11-23 ENCOUNTER — Other Ambulatory Visit (INDEPENDENT_AMBULATORY_CARE_PROVIDER_SITE_OTHER): Payer: Self-pay | Admitting: Orthopedic Surgery

## 2017-11-23 NOTE — Telephone Encounter (Signed)
y

## 2017-11-23 NOTE — Telephone Encounter (Signed)
Ok to rf? 

## 2017-12-08 DIAGNOSIS — E119 Type 2 diabetes mellitus without complications: Secondary | ICD-10-CM | POA: Diagnosis not present

## 2017-12-08 DIAGNOSIS — H5203 Hypermetropia, bilateral: Secondary | ICD-10-CM | POA: Diagnosis not present

## 2017-12-24 ENCOUNTER — Other Ambulatory Visit (INDEPENDENT_AMBULATORY_CARE_PROVIDER_SITE_OTHER): Payer: Self-pay | Admitting: Orthopedic Surgery

## 2017-12-27 NOTE — Telephone Encounter (Signed)
Ok to rf? 

## 2018-01-05 DIAGNOSIS — Z23 Encounter for immunization: Secondary | ICD-10-CM | POA: Diagnosis not present

## 2018-01-05 DIAGNOSIS — I10 Essential (primary) hypertension: Secondary | ICD-10-CM | POA: Diagnosis not present

## 2018-01-05 DIAGNOSIS — E7800 Pure hypercholesterolemia, unspecified: Secondary | ICD-10-CM | POA: Diagnosis not present

## 2018-01-14 ENCOUNTER — Other Ambulatory Visit: Payer: Self-pay | Admitting: Endocrinology

## 2018-01-22 ENCOUNTER — Other Ambulatory Visit (INDEPENDENT_AMBULATORY_CARE_PROVIDER_SITE_OTHER): Payer: Self-pay | Admitting: Orthopedic Surgery

## 2018-01-24 NOTE — Telephone Encounter (Signed)
y

## 2018-01-24 NOTE — Telephone Encounter (Signed)
Ok to rf? 

## 2018-02-16 DIAGNOSIS — I10 Essential (primary) hypertension: Secondary | ICD-10-CM | POA: Diagnosis not present

## 2018-02-16 DIAGNOSIS — E1169 Type 2 diabetes mellitus with other specified complication: Secondary | ICD-10-CM | POA: Diagnosis not present

## 2018-02-16 DIAGNOSIS — E7800 Pure hypercholesterolemia, unspecified: Secondary | ICD-10-CM | POA: Diagnosis not present

## 2018-02-19 ENCOUNTER — Other Ambulatory Visit (INDEPENDENT_AMBULATORY_CARE_PROVIDER_SITE_OTHER): Payer: Self-pay | Admitting: Orthopedic Surgery

## 2018-02-21 NOTE — Telephone Encounter (Signed)
Ok to rf? 

## 2018-02-21 NOTE — Telephone Encounter (Signed)
y

## 2018-04-17 ENCOUNTER — Other Ambulatory Visit: Payer: Self-pay | Admitting: Endocrinology

## 2018-04-17 NOTE — Telephone Encounter (Signed)
Please refill x 1 Ov is due  

## 2018-05-18 ENCOUNTER — Other Ambulatory Visit: Payer: Self-pay | Admitting: Endocrinology

## 2018-06-17 ENCOUNTER — Other Ambulatory Visit: Payer: Self-pay | Admitting: Family Medicine

## 2018-06-17 DIAGNOSIS — Z1231 Encounter for screening mammogram for malignant neoplasm of breast: Secondary | ICD-10-CM

## 2018-07-12 ENCOUNTER — Ambulatory Visit: Payer: 59

## 2018-07-28 DIAGNOSIS — I10 Essential (primary) hypertension: Secondary | ICD-10-CM | POA: Diagnosis not present

## 2018-07-28 DIAGNOSIS — E7800 Pure hypercholesterolemia, unspecified: Secondary | ICD-10-CM | POA: Diagnosis not present

## 2018-07-28 DIAGNOSIS — E1169 Type 2 diabetes mellitus with other specified complication: Secondary | ICD-10-CM | POA: Diagnosis not present

## 2018-08-01 DIAGNOSIS — E1169 Type 2 diabetes mellitus with other specified complication: Secondary | ICD-10-CM | POA: Diagnosis not present

## 2018-08-25 ENCOUNTER — Ambulatory Visit: Payer: 59

## 2018-10-05 ENCOUNTER — Ambulatory Visit
Admission: RE | Admit: 2018-10-05 | Discharge: 2018-10-05 | Disposition: A | Discharge status: Home / Self Care | Payer: 59 | Source: Ambulatory Visit | Attending: Family Medicine | Admitting: Family Medicine

## 2018-10-05 ENCOUNTER — Other Ambulatory Visit: Payer: Self-pay

## 2018-10-05 DIAGNOSIS — Z1231 Encounter for screening mammogram for malignant neoplasm of breast: Secondary | ICD-10-CM

## 2019-08-16 IMAGING — MG DIGITAL SCREENING BILATERAL MAMMOGRAM WITH CAD
5 series · 5 of 5 positions shown · non-contrast
Comparison: Previous exam(s).

CLINICAL DATA: Screening.

EXAM:
DIGITAL SCREENING BILATERAL MAMMOGRAM WITH CAD

[R MLO (1 of 2)]
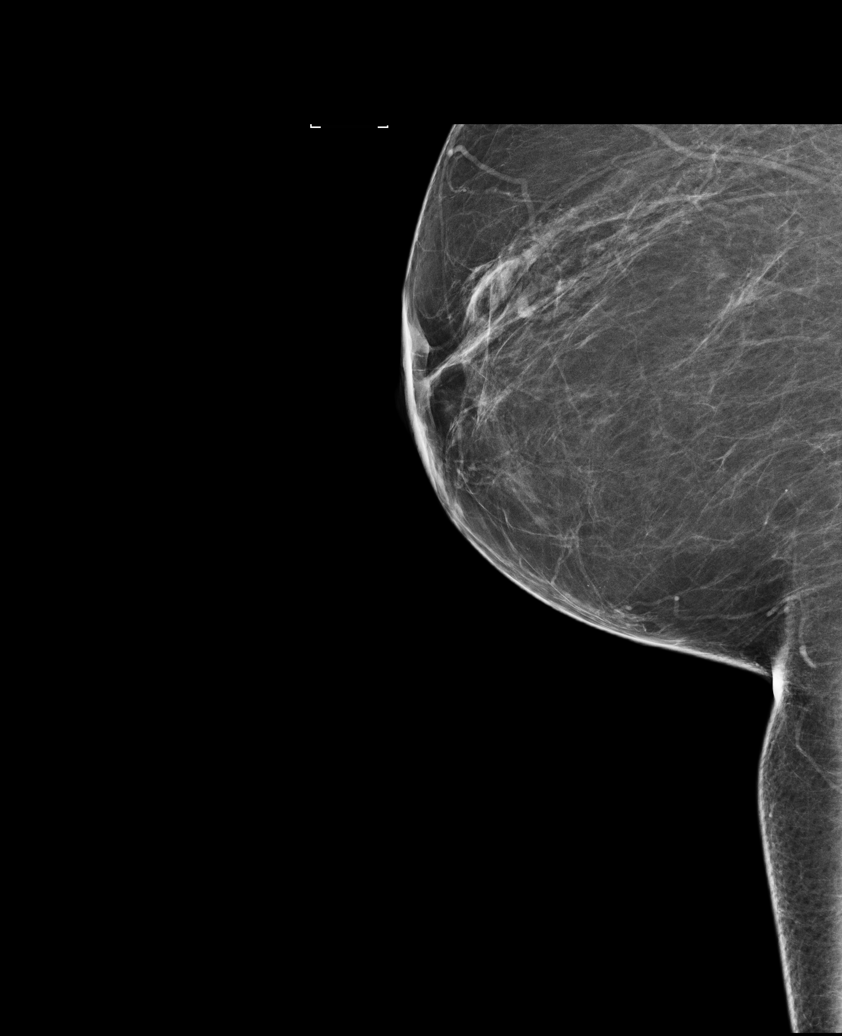

[L CC]
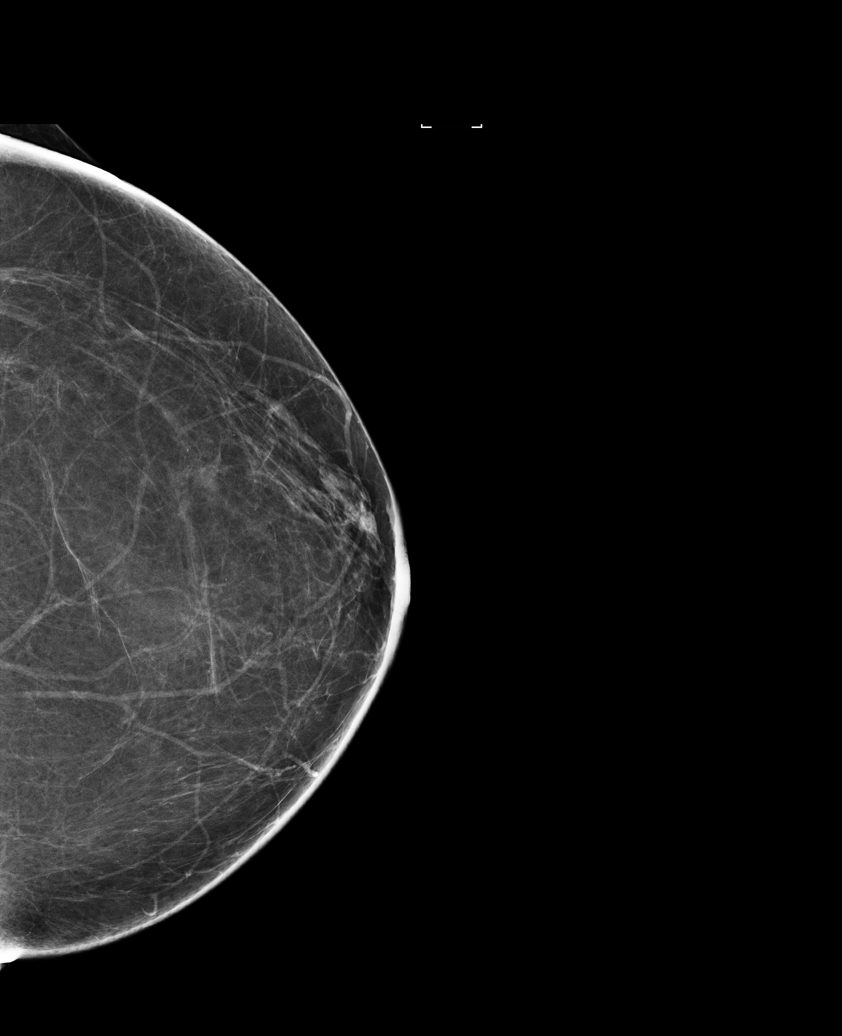

[L MLO]
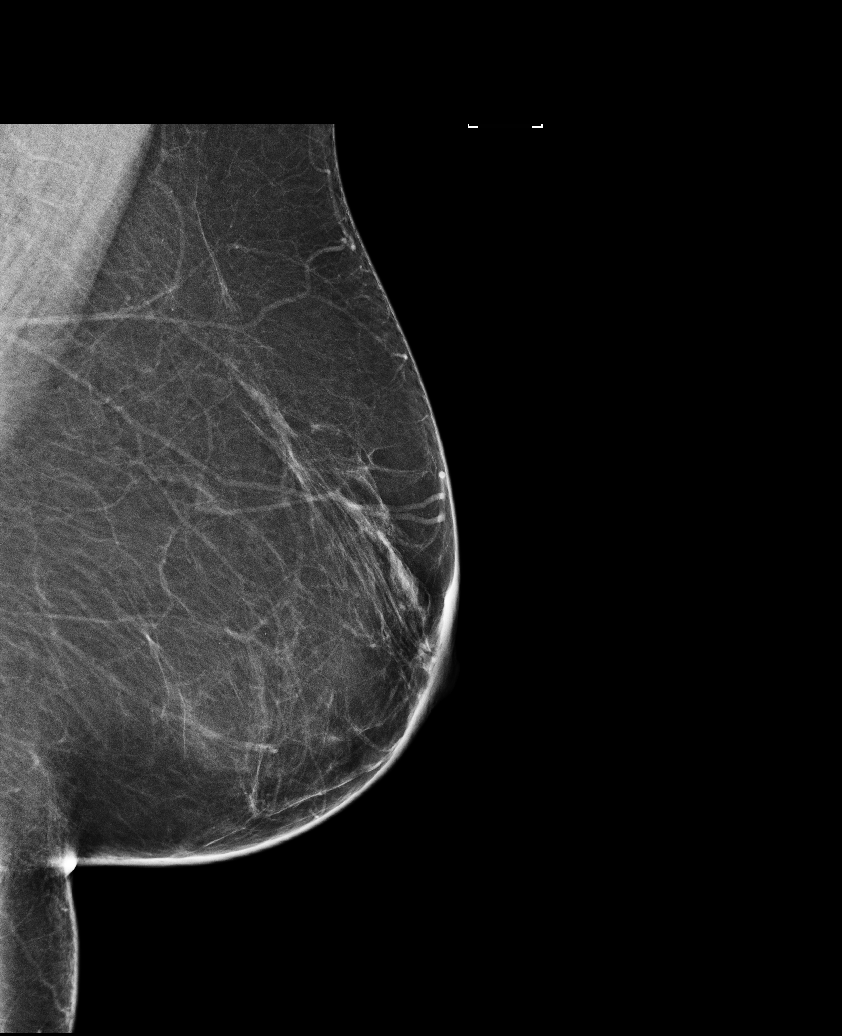

[R CC]
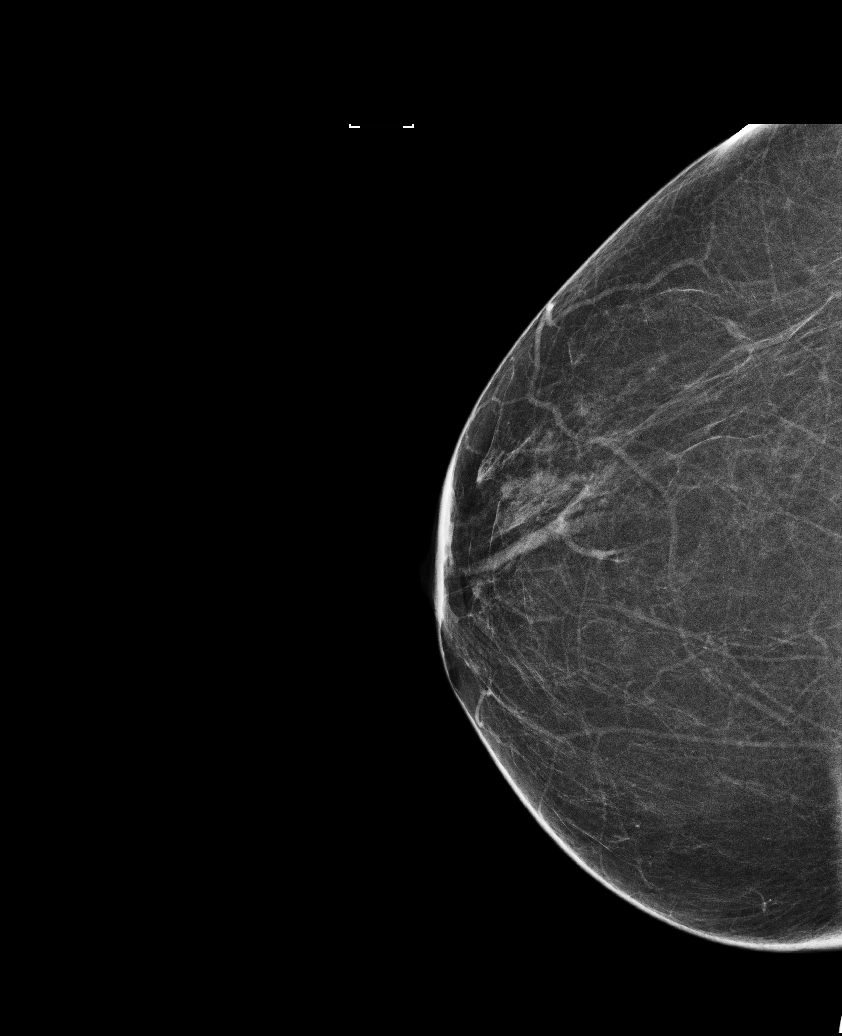

[R MLO (2 of 2)]
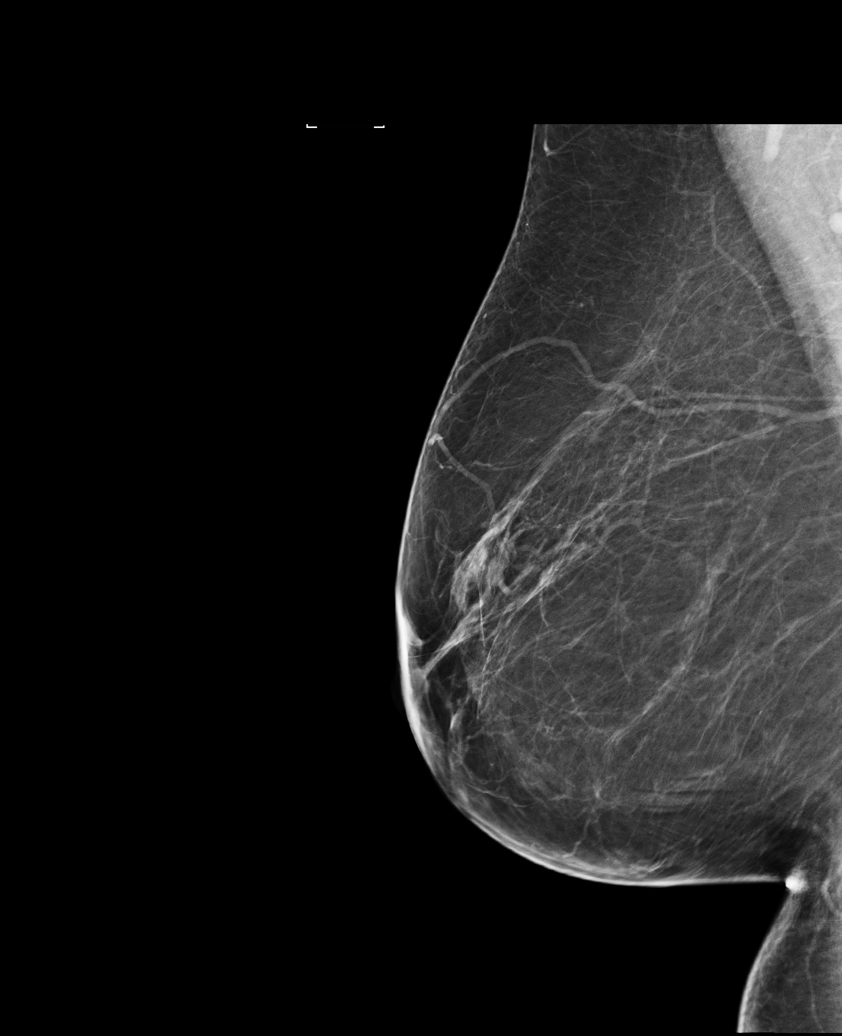

[5 of 5 positions shown; findings below may reference images not displayed]

ACR Breast Density Category b: There are scattered areas of
fibroglandular density.
FINDINGS: There are no findings suspicious for malignancy. Images were
processed with CAD.
IMPRESSION: No mammographic evidence of malignancy. A result letter of this
screening mammogram will be mailed directly to the patient.

RECOMMENDATION:
Screening mammogram in one year. (Code:AS-G-LCT)

BI-RADS CATEGORY  1: Negative.

## 2019-10-07 ENCOUNTER — Inpatient Hospital Stay (HOSPITAL_COMMUNITY)
Admission: EM | Admit: 2019-10-07 | Discharge: 2019-10-10 | DRG: 643 | Disposition: A | Discharge status: Home / Self Care | Payer: Medicare Other | Attending: Internal Medicine | Admitting: Internal Medicine

## 2019-10-07 ENCOUNTER — Observation Stay (HOSPITAL_BASED_OUTPATIENT_CLINIC_OR_DEPARTMENT_OTHER): Payer: Medicare Other

## 2019-10-07 ENCOUNTER — Observation Stay (HOSPITAL_COMMUNITY): Payer: Medicare Other

## 2019-10-07 ENCOUNTER — Emergency Department (HOSPITAL_COMMUNITY): Payer: Medicare Other

## 2019-10-07 ENCOUNTER — Encounter (HOSPITAL_COMMUNITY): Payer: Self-pay | Admitting: Emergency Medicine

## 2019-10-07 ENCOUNTER — Other Ambulatory Visit: Payer: Self-pay

## 2019-10-07 DIAGNOSIS — K648 Other hemorrhoids: Secondary | ICD-10-CM | POA: Diagnosis present

## 2019-10-07 DIAGNOSIS — Z833 Family history of diabetes mellitus: Secondary | ICD-10-CM

## 2019-10-07 DIAGNOSIS — E1142 Type 2 diabetes mellitus with diabetic polyneuropathy: Secondary | ICD-10-CM | POA: Diagnosis present

## 2019-10-07 DIAGNOSIS — E0590 Thyrotoxicosis, unspecified without thyrotoxic crisis or storm: Secondary | ICD-10-CM | POA: Diagnosis not present

## 2019-10-07 DIAGNOSIS — F17200 Nicotine dependence, unspecified, uncomplicated: Secondary | ICD-10-CM | POA: Diagnosis present

## 2019-10-07 DIAGNOSIS — K31811 Angiodysplasia of stomach and duodenum with bleeding: Secondary | ICD-10-CM | POA: Diagnosis present

## 2019-10-07 DIAGNOSIS — E1165 Type 2 diabetes mellitus with hyperglycemia: Secondary | ICD-10-CM | POA: Diagnosis present

## 2019-10-07 DIAGNOSIS — Z803 Family history of malignant neoplasm of breast: Secondary | ICD-10-CM

## 2019-10-07 DIAGNOSIS — M79662 Pain in left lower leg: Secondary | ICD-10-CM | POA: Diagnosis present

## 2019-10-07 DIAGNOSIS — R0602 Shortness of breath: Secondary | ICD-10-CM

## 2019-10-07 DIAGNOSIS — Z79899 Other long term (current) drug therapy: Secondary | ICD-10-CM

## 2019-10-07 DIAGNOSIS — Z86711 Personal history of pulmonary embolism: Secondary | ICD-10-CM

## 2019-10-07 DIAGNOSIS — I82409 Acute embolism and thrombosis of unspecified deep veins of unspecified lower extremity: Secondary | ICD-10-CM | POA: Diagnosis not present

## 2019-10-07 DIAGNOSIS — I10 Essential (primary) hypertension: Secondary | ICD-10-CM | POA: Diagnosis present

## 2019-10-07 DIAGNOSIS — R252 Cramp and spasm: Secondary | ICD-10-CM | POA: Diagnosis present

## 2019-10-07 DIAGNOSIS — K222 Esophageal obstruction: Secondary | ICD-10-CM | POA: Diagnosis present

## 2019-10-07 DIAGNOSIS — Z86718 Personal history of other venous thrombosis and embolism: Secondary | ICD-10-CM

## 2019-10-07 DIAGNOSIS — R079 Chest pain, unspecified: Secondary | ICD-10-CM

## 2019-10-07 DIAGNOSIS — K259 Gastric ulcer, unspecified as acute or chronic, without hemorrhage or perforation: Secondary | ICD-10-CM | POA: Diagnosis present

## 2019-10-07 DIAGNOSIS — G629 Polyneuropathy, unspecified: Secondary | ICD-10-CM

## 2019-10-07 DIAGNOSIS — F17210 Nicotine dependence, cigarettes, uncomplicated: Secondary | ICD-10-CM | POA: Diagnosis present

## 2019-10-07 DIAGNOSIS — E785 Hyperlipidemia, unspecified: Secondary | ICD-10-CM | POA: Diagnosis present

## 2019-10-07 DIAGNOSIS — E781 Pure hyperglyceridemia: Secondary | ICD-10-CM | POA: Diagnosis present

## 2019-10-07 DIAGNOSIS — R6881 Early satiety: Secondary | ICD-10-CM | POA: Diagnosis present

## 2019-10-07 DIAGNOSIS — F419 Anxiety disorder, unspecified: Secondary | ICD-10-CM | POA: Diagnosis present

## 2019-10-07 DIAGNOSIS — I200 Unstable angina: Secondary | ICD-10-CM

## 2019-10-07 DIAGNOSIS — D500 Iron deficiency anemia secondary to blood loss (chronic): Secondary | ICD-10-CM

## 2019-10-07 DIAGNOSIS — K621 Rectal polyp: Secondary | ICD-10-CM | POA: Diagnosis present

## 2019-10-07 DIAGNOSIS — K449 Diaphragmatic hernia without obstruction or gangrene: Secondary | ICD-10-CM | POA: Diagnosis present

## 2019-10-07 DIAGNOSIS — Z885 Allergy status to narcotic agent status: Secondary | ICD-10-CM

## 2019-10-07 DIAGNOSIS — I2510 Atherosclerotic heart disease of native coronary artery without angina pectoris: Secondary | ICD-10-CM | POA: Diagnosis present

## 2019-10-07 DIAGNOSIS — Z794 Long term (current) use of insulin: Secondary | ICD-10-CM

## 2019-10-07 DIAGNOSIS — E7800 Pure hypercholesterolemia, unspecified: Secondary | ICD-10-CM | POA: Diagnosis present

## 2019-10-07 DIAGNOSIS — Z8249 Family history of ischemic heart disease and other diseases of the circulatory system: Secondary | ICD-10-CM

## 2019-10-07 DIAGNOSIS — Z20822 Contact with and (suspected) exposure to covid-19: Secondary | ICD-10-CM | POA: Diagnosis present

## 2019-10-07 DIAGNOSIS — K635 Polyp of colon: Secondary | ICD-10-CM | POA: Diagnosis present

## 2019-10-07 DIAGNOSIS — E119 Type 2 diabetes mellitus without complications: Secondary | ICD-10-CM

## 2019-10-07 DIAGNOSIS — M79661 Pain in right lower leg: Secondary | ICD-10-CM | POA: Diagnosis present

## 2019-10-07 DIAGNOSIS — Z888 Allergy status to other drugs, medicaments and biological substances status: Secondary | ICD-10-CM

## 2019-10-07 DIAGNOSIS — Z96652 Presence of left artificial knee joint: Secondary | ICD-10-CM | POA: Diagnosis present

## 2019-10-07 LAB — CBC
HCT: 33.2 % — ABNORMAL LOW (ref 36.0–46.0)
Hemoglobin: 9.8 g/dL — ABNORMAL LOW (ref 12.0–15.0)
MCH: 22.9 pg — ABNORMAL LOW (ref 26.0–34.0)
MCHC: 29.5 g/dL — ABNORMAL LOW (ref 30.0–36.0)
MCV: 77.6 fL — ABNORMAL LOW (ref 80.0–100.0)
Platelets: 413 10*3/uL — ABNORMAL HIGH (ref 150–400)
RBC: 4.28 MIL/uL (ref 3.87–5.11)
RDW: 17.2 % — ABNORMAL HIGH (ref 11.5–15.5)
WBC: 7.3 10*3/uL (ref 4.0–10.5)
nRBC: 0 % (ref 0.0–0.2)

## 2019-10-07 LAB — LIPID PANEL
Cholesterol: 299 mg/dL — ABNORMAL HIGH (ref 0–200)
HDL: 50 mg/dL (ref >40)
LDL Cholesterol: UNDETERMINED mg/dL (ref 0–99)
Total CHOL/HDL Ratio: 6 RATIO
Triglycerides: 447 mg/dL — ABNORMAL HIGH (ref <150)
VLDL: UNDETERMINED mg/dL (ref 0–40)

## 2019-10-07 LAB — GLUCOSE, CAPILLARY
Glucose-Capillary: 260 mg/dL — ABNORMAL HIGH (ref 70–99)
Glucose-Capillary: 139 mg/dL — ABNORMAL HIGH (ref 70–99)

## 2019-10-07 LAB — ECHOCARDIOGRAM COMPLETE
Height: 65 in
Weight: 2800 oz

## 2019-10-07 LAB — BASIC METABOLIC PANEL
Anion gap: 15 (ref 5–15)
BUN: 33 mg/dL — ABNORMAL HIGH (ref 8–23)
CO2: 19 mmol/L — ABNORMAL LOW (ref 22–32)
Calcium: 9.6 mg/dL (ref 8.9–10.3)
Chloride: 101 mmol/L (ref 98–111)
Creatinine, Ser: 1.12 mg/dL — ABNORMAL HIGH (ref 0.44–1.00)
GFR calc Af Amer: 60 mL/min — ABNORMAL LOW (ref >60)
GFR calc non Af Amer: 52 mL/min — ABNORMAL LOW (ref >60)
Glucose, Bld: 455 mg/dL — ABNORMAL HIGH (ref 70–99)
Potassium: 3.5 mmol/L (ref 3.5–5.1)
Sodium: 135 mmol/L (ref 135–145)

## 2019-10-07 LAB — HIV ANTIBODY (ROUTINE TESTING W REFLEX): HIV Screen 4th Generation wRfx: NONREACTIVE

## 2019-10-07 LAB — BETA-HYDROXYBUTYRIC ACID: Beta-Hydroxybutyric Acid: 0.13 mmol/L (ref 0.05–0.27)

## 2019-10-07 LAB — IRON AND TIBC
Iron: 21 ug/dL — ABNORMAL LOW (ref 28–170)
Saturation Ratios: 4 % — ABNORMAL LOW (ref 10.4–31.8)
TIBC: 577 ug/dL — ABNORMAL HIGH (ref 250–450)
UIBC: 556 ug/dL

## 2019-10-07 LAB — D-DIMER, QUANTITATIVE: D-Dimer, Quant: 0.51 ug/mL-FEU — ABNORMAL HIGH (ref 0.00–0.50)

## 2019-10-07 LAB — SARS CORONAVIRUS 2 BY RT PCR (HOSPITAL ORDER, PERFORMED IN ~~LOC~~ HOSPITAL LAB): SARS Coronavirus 2: NEGATIVE

## 2019-10-07 LAB — TROPONIN I (HIGH SENSITIVITY)
Troponin I (High Sensitivity): 13 ng/L (ref <18)
Troponin I (High Sensitivity): 19 ng/L — ABNORMAL HIGH (ref <18)
Troponin I (High Sensitivity): 21 ng/L — ABNORMAL HIGH (ref <18)

## 2019-10-07 LAB — CBG MONITORING, ED: Glucose-Capillary: 278 mg/dL — ABNORMAL HIGH (ref 70–99)

## 2019-10-07 LAB — HEMOGLOBIN A1C
Hgb A1c MFr Bld: 8.8 % — ABNORMAL HIGH (ref 4.8–5.6)
Mean Plasma Glucose: 205.86 mg/dL

## 2019-10-07 LAB — TSH: TSH: 0.01 u[IU]/mL — ABNORMAL LOW (ref 0.350–4.500)

## 2019-10-07 LAB — LDL CHOLESTEROL, DIRECT: Direct LDL: 158.8 mg/dL — ABNORMAL HIGH (ref 0–99)

## 2019-10-07 LAB — HEPARIN LEVEL (UNFRACTIONATED): Heparin Unfractionated: 0.65 IU/mL (ref 0.30–0.70)

## 2019-10-07 MED ORDER — INSULIN ASPART 100 UNIT/ML ~~LOC~~ SOLN
0.0000 [IU] | Freq: Three times a day (TID) | SUBCUTANEOUS | Status: DC
  Administered 2019-10-07: 8 [IU] via SUBCUTANEOUS
  Administered 2019-10-08: 5 [IU] via SUBCUTANEOUS
  Administered 2019-10-08 (×2): 8 [IU] via SUBCUTANEOUS
  Administered 2019-10-09: 3 [IU] via SUBCUTANEOUS
  Administered 2019-10-09: 11 [IU] via SUBCUTANEOUS
  Administered 2019-10-10: 9 [IU] via SUBCUTANEOUS

## 2019-10-07 MED ORDER — METOPROLOL TARTRATE 12.5 MG HALF TABLET
12.5000 mg | ORAL_TABLET | Freq: Two times a day (BID) | ORAL | Status: DC
  Administered 2019-10-07 – 2019-10-10 (×7): 12.5 mg via ORAL
  Filled 2019-10-07 (×7): qty 1

## 2019-10-07 MED ORDER — ONDANSETRON HCL 4 MG/2ML IJ SOLN
4.0000 mg | Freq: Four times a day (QID) | INTRAMUSCULAR | Status: DC | PRN
  Administered 2019-10-07: 4 mg via INTRAVENOUS
  Filled 2019-10-07: qty 2

## 2019-10-07 MED ORDER — ACETAMINOPHEN 500 MG PO TABS
1000.0000 mg | ORAL_TABLET | Freq: Once | ORAL | Status: AC
  Administered 2019-10-07: 1000 mg via ORAL
  Filled 2019-10-07: qty 2

## 2019-10-07 MED ORDER — HYDROCHLOROTHIAZIDE 25 MG PO TABS
12.5000 mg | ORAL_TABLET | Freq: Every morning | ORAL | Status: DC
  Administered 2019-10-07: 12.5 mg via ORAL
  Filled 2019-10-07: qty 1

## 2019-10-07 MED ORDER — ENOXAPARIN SODIUM 40 MG/0.4ML ~~LOC~~ SOLN
40.0000 mg | SUBCUTANEOUS | Status: DC
  Filled 2019-10-07: qty 0.4

## 2019-10-07 MED ORDER — ASPIRIN 81 MG PO CHEW
81.0000 mg | CHEWABLE_TABLET | Freq: Every day | ORAL | Status: DC
  Administered 2019-10-08 – 2019-10-10 (×2): 81 mg via ORAL
  Filled 2019-10-07 (×3): qty 1

## 2019-10-07 MED ORDER — IOHEXOL 350 MG/ML SOLN
100.0000 mL | Freq: Once | INTRAVENOUS | Status: AC | PRN
  Administered 2019-10-07: 65 mL via INTRAVENOUS

## 2019-10-07 MED ORDER — SODIUM CHLORIDE 0.9 % IV SOLN: INTRAVENOUS | Status: AC

## 2019-10-07 MED ORDER — HYDROMORPHONE HCL 1 MG/ML IJ SOLN
0.5000 mg | Freq: Once | INTRAMUSCULAR | Status: AC
  Administered 2019-10-07: 0.5 mg via INTRAVENOUS
  Filled 2019-10-07: qty 1

## 2019-10-07 MED ORDER — OXYCODONE HCL 5 MG PO TABS
2.5000 mg | ORAL_TABLET | Freq: Once | ORAL | Status: AC
  Administered 2019-10-07: 2.5 mg via ORAL
  Filled 2019-10-07: qty 1

## 2019-10-07 MED ORDER — ALPRAZOLAM 0.25 MG PO TABS
0.2500 mg | ORAL_TABLET | Freq: Once | ORAL | Status: AC
  Administered 2019-10-08: 0.25 mg via ORAL
  Filled 2019-10-07: qty 1

## 2019-10-07 MED ORDER — SODIUM CHLORIDE 0.9% FLUSH
3.0000 mL | Freq: Once | INTRAVENOUS | Status: AC
  Administered 2019-10-07: 3 mL via INTRAVENOUS

## 2019-10-07 MED ORDER — INSULIN ASPART 100 UNIT/ML ~~LOC~~ SOLN
0.0000 [IU] | Freq: Every day | SUBCUTANEOUS | Status: DC
  Administered 2019-10-09: 3 [IU] via SUBCUTANEOUS

## 2019-10-07 MED ORDER — ACETAMINOPHEN 650 MG RE SUPP: 650.0000 mg | Freq: Four times a day (QID) | RECTAL | Status: DC | PRN

## 2019-10-07 MED ORDER — NICOTINE 14 MG/24HR TD PT24
14.0000 mg | MEDICATED_PATCH | Freq: Every day | TRANSDERMAL | Status: DC
  Administered 2019-10-07 – 2019-10-10 (×4): 14 mg via TRANSDERMAL
  Filled 2019-10-07 (×4): qty 1

## 2019-10-07 MED ORDER — HEPARIN (PORCINE) 25000 UT/250ML-% IV SOLN
850.0000 [IU]/h | INTRAVENOUS | Status: DC
  Administered 2019-10-07: 900 [IU]/h via INTRAVENOUS
  Filled 2019-10-07: qty 250

## 2019-10-07 MED ORDER — ATORVASTATIN CALCIUM 40 MG PO TABS
40.0000 mg | ORAL_TABLET | Freq: Every day | ORAL | Status: DC
  Administered 2019-10-07 – 2019-10-09 (×3): 40 mg via ORAL
  Filled 2019-10-07 (×3): qty 1

## 2019-10-07 MED ORDER — ACETAMINOPHEN 325 MG PO TABS
650.0000 mg | ORAL_TABLET | Freq: Four times a day (QID) | ORAL | Status: DC | PRN
  Administered 2019-10-07 – 2019-10-09 (×2): 650 mg via ORAL
  Filled 2019-10-07 (×2): qty 2

## 2019-10-07 MED ORDER — IRBESARTAN 150 MG PO TABS
150.0000 mg | ORAL_TABLET | Freq: Every day | ORAL | Status: DC
  Administered 2019-10-07 – 2019-10-10 (×4): 150 mg via ORAL
  Filled 2019-10-07 (×4): qty 1

## 2019-10-07 MED ORDER — HEPARIN BOLUS VIA INFUSION
4000.0000 [IU] | Freq: Once | INTRAVENOUS | Status: AC
  Administered 2019-10-07: 4000 [IU] via INTRAVENOUS
  Filled 2019-10-07: qty 4000

## 2019-10-07 NOTE — Progress Notes (Signed)
ANTICOAGULATION CONSULT NOTE  Pharmacy Consult for Heparin Indication: chest pain/ACS  Allergies  Allergen Reactions   Codeine Sulfate Nausea And Vomiting and Other (See Comments)    dizziness   Metformin Diarrhea and Other (See Comments)    fatigue    Patient Measurements: Height: 5\' 5"  (165.1 cm) Weight: 79.4 kg (175 lb) IBW/kg (Calculated) : 57 Heparin Dosing Weight: 73.7 kg  Vital Signs: BP: 132/76 (07/03 1300) Pulse Rate: 73 (07/03 1202)  Labs: Recent Labs    10/07/19 0846 10/07/19 1027  HGB 9.8*  --   HCT 33.2*  --   PLT 413*  --   CREATININE 1.12*  --   TROPONINIHS 13 19*    Estimated Creatinine Clearance: 52.2 mL/min (A) (by C-G formula based on SCr of 1.12 mg/dL (H)).   Medical History: Past Medical History:  Diagnosis Date   Blood transfusion    d/t hemmorrhage post c- section   DEPRESSION 01/24/2009   DM 01/24/2009   H/O blood transfusion reaction 09/1992   cause PE and hemolytic type reaction per pt   History of one miscarriage    HYPERCHOLESTEROLEMIA 01/24/2009   HYPERTENSION 01/24/2009   Osteoarthritis    Pulmonary embolism (HCC)    result of blood transfusion reaction 18 years ago after childbirth    Medications:  Scheduled:   aspirin  81 mg Oral Daily   atorvastatin  40 mg Oral Daily   heparin  4,000 Units Intravenous Once    HYDROmorphone (DILAUDID) injection  0.5 mg Intravenous Once   insulin aspart  0-15 Units Subcutaneous TID WC   insulin aspart  0-5 Units Subcutaneous QHS   irbesartan  150 mg Oral Daily   metoprolol tartrate  12.5 mg Oral BID   nicotine  14 mg Transdermal Daily    Assessment: Patient is a 84 yof that presents to the ED with chest pain. Patient does have a hx of DVT & PE during pregnancy however is not on anticoagulation at this time. Patient does has a small bump in her trop on repeat. Pharmacy has been asked to dose heparin at this time.   Goal of Therapy:  Heparin level 0.3-0.7  units/ml Monitor platelets by anticoagulation protocol: Yes   Plan:  - Heparin bolus 4000 units IV x 1 dose - Heparin drip @ 900 units/hr - Heparin level in ~ 6 hours  - Monitor patient for s/s of bleeding and CBC while on heparin   76 PharmD. BCPS  10/07/2019,2:23 PM

## 2019-10-07 NOTE — ED Provider Notes (Addendum)
[facility] EMERGENCY DEPARTMENT Provider Note   CSN: [id number] Arrival date & time: 10/07/19  2025     History Chief Complaint  Patient presents with  . Shortness of Breath  . Chest Pain    Chelsea Gray is a 64 y.o. female.  64 yo F with a chief complaints of chest pain.  Patient states it felt like a pressure across her chest.  There is no radiation of the pain.  She was also significantly short of breath.  Improved with rest.  She normally walks every morning with her sisters and had gotten up to walk this morning and started having symptoms about 2 hours ago.  She thinks she had an event yesterday when she was walking that lasted for about 15 minutes.  No diaphoresis had some nausea off and on.  She is also been having bilateral leg cramping.  Worse to the area behind the knee.  Occurs on both sides.  No swelling.  She has a remote history of a pulmonary embolism.  She is not sure if this feels the same.  She denies recent surgery immobilization or hospitalization denies estrogen use.  Patient does have a history of hypertension hyperlipidemia diabetes and is a current smoker.  She also has a family history of early MI in her father.  Recently saw her family doctor and was having difficulty controlling her blood pressure and so was just gone up on her antihypertensive medications and add another agent.  The history is provided by the patient.  Shortness of Breath Severity:  Moderate Onset quality:  Gradual Duration:  2 days Timing:  Constant Progression:  Worsening Chronicity:  New Relieved by:  Nothing Worsened by:  Nothing Ineffective treatments:  None tried Associated symptoms: chest pain   Associated symptoms: no fever, no headaches, no vomiting and no wheezing   Chest Pain Associated symptoms: fatigue, nausea and shortness of breath   Associated symptoms: no dizziness, no fever, no headache, no palpitations and no vomiting        Past Medical  History:  Diagnosis Date  . Blood transfusion    d/t hemmorrhage post c- section  . DEPRESSION 01/24/2009  . DM 01/24/2009  . 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 History of one miscarriage   . HYPERCHOLESTEROLEMIA 01/24/2009  . HYPERTENSION 01/24/2009  . Osteoarthritis   . Pulmonary embolism (HCC)    result of blood transfusion reaction 18 years ago after childbirth    Patient Active Problem List   Diagnosis Date Noted  . Chest pain 10/08/2019  . Chest pain with moderate risk for cardiac etiology 10/07/2019  . Abdominal pain, unspecified site 12/24/2010  . MYALGIA 06/19/2009  . NUMBNESS 06/19/2009  . Diabetes (HCC) 01/24/2009  . HYPERCHOLESTEROLEMIA 01/24/2009  . SMOKER 01/24/2009  . DEPRESSION 01/24/2009  . Essential hypertension 01/24/2009  . CHEST PAIN 01/24/2009  . Neuropathy 01/24/2009  . Iron deficiency anemia due to chronic blood loss 01/24/2009    Past Surgical History:  Procedure Laterality Date  . BIOPSY  10/09/2019   Procedure: BIOPSY;  Surgeon: Kerin Salen, MD;  Location: [facility] ENDOSCOPY;  Service: Gastroenterology;;  . BREAST CYST EXCISION    . BREAST REDUCTION SURGERY    . CERVICAL CERCLAGE    . CESAREAN SECTION    . COLONOSCOPY WITH PROPOFOL N/A 10/09/2019   Procedure: COLONOSCOPY WITH PROPOFOL;  Surgeon: Kerin Salen, MD;  Location: [facility] ENDOSCOPY;  Service: Gastroenterology;  Laterality: N/A;  . ESOPHAGOGASTRODUODENOSCOPY (EGD) WITH PROPOFOL N/A 10/09/2019   Procedure: ESOPHAGOGASTRODUODENOSCOPY (EGD) WITH PROPOFOL;  Surgeon: Kerin SalenKarki, Arya, MD;  [facility];  Service: Gastroenterology;  Laterality: N/A;  . HEMOSTASIS CLIP PLACEMENT  10/09/2019   Procedure: HEMOSTASIS CLIP PLACEMENT;  Surgeon: Kerin SalenKarki, Arya, MD;  [facility];  Service: Gastroenterology;;  . HOT HEMOSTASIS N/A 10/09/2019   Procedure: HOT HEMOSTASIS (ARGON PLASMA COAGULATION/BICAP);  Surgeon: Kerin SalenKarki, Arya, MD;  [facility];   Service: Gastroenterology;  Laterality: N/A;  . JOINT REPLACEMENT    . KNEE ARTHROPLASTY  07/14/2011   Procedure: COMPUTER ASSISTED TOTAL KNEE ARTHROPLASTY;  Surgeon: Cammy CopaGregory Scott Dean, MD;  [facility];  Service: Orthopedics;  Laterality: Left;  Left total knee arthroplasty  . POLYPECTOMY  10/09/2019   Procedure: POLYPECTOMY;  Surgeon: Kerin SalenKarki, Arya, MD;  [facility];  Service: Gastroenterology;;  . REDUCTION MAMMAPLASTY Bilateral      OB History   No obstetric history on file.     Family History  Problem Relation Age of Onset  . Diabetes Mother   . Hypertension Mother   . Diabetes Father   . Arrhythmia Father   . Heart disease Father        CABG  . Breast cancer Sister   . Diabetes Sister   . Anesthesia problems Neg Hx     Social History   Tobacco Use  . Smoking status: Current Every Day Smoker    Packs/day: 0.10    Years: 6.00    Pack years: 0.60  . Smokeless tobacco: Never Used  Substance Use Topics  . Alcohol use: Yes    Comment: once drink every few months  . Drug use: No    Home Medications Prior to Admission medications   Medication Sig Start Date End Date Taking? Authorizing Provider  atorvastatin (LIPITOR) 40 MG tablet Take 40 mg by mouth daily. 07/13/19  Yes [provider]  FREESTYLE LITE test strip CHECK BLOOD SUGAR ONCE A DAY Patient taking differently: 1 each by Other route daily.  08/27/15  Yes Romero BellingEllison, Sean, MD  INVOKANA 300 MG TABS tablet TAKE 1 TABLET BY MOUTH EVERY DAY Patient taking differently: Take 300 mg by mouth daily before breakfast.  04/18/18  Yes Romero BellingEllison, Sean, MD  metFORMIN (GLUCOPHAGE) 500 MG tablet Take 1 tablet (500 mg total) by mouth 2 (two) times daily with a meal. Patient taking differently: Take 500 mg by mouth daily with breakfast.  12/15/16  Yes Romero BellingEllison, Sean, MD  NOVOTWIST 32G X 5 MM MISC USE DAILY FOR INJECTION Patient taking differently: 1 each by Other route daily.  05/21/15  Yes Romero BellingEllison, Sean, MD  olmesartan  (BENICAR) 20 MG tablet Take 20 mg by mouth daily.   Yes [provider]  TRULICITY 1.5 MG/0.5ML SOPN INJECT 1.5 MG INTO THE SKIN ONCE A WEEK. 09/21/16  Yes Romero BellingEllison, Sean, MD  aspirin 81 MG chewable tablet Chew 1 tablet (81 mg total) by mouth daily. 10/11/19   Regalado, Belkys A, MD  ferrous sulfate 325 (65 FE) MG tablet Take 1 tablet (325 mg total) by mouth daily. 10/10/19 10/09/20  Regalado, Belkys A, MD  gabapentin (NEURONTIN) 100 MG capsule Take 2 capsules (200 mg total) by mouth 2 (two) times daily. 10/10/19   Regalado, Belkys A, MD  methimazole (TAPAZOLE) 10 MG tablet Take 2 tablets (20 mg total) by mouth daily. 10/11/19   Regalado, Belkys A, MD  metoprolol tartrate (LOPRESSOR) 25 MG tablet Take 1 tablet (25 mg total)  by mouth 2 (two) times daily. 10/10/19   Regalado, Belkys A, MD  nicotine (NICODERM CQ - DOSED IN MG/24 HOURS) 14 mg/24hr patch Place 1 patch (14 mg total) onto the skin daily. 10/11/19   Regalado, Belkys A, MD  nitroGLYCERIN (NITROSTAT) 0.4 MG SL tablet Place 1 tablet (0.4 mg total) under the tongue every 5 (five) minutes as needed for chest pain. 10/10/19 10/09/20  Regalado, Belkys A, MD  pantoprazole (PROTONIX) 40 MG tablet Take 1 tablet (40 mg total) by mouth daily. 10/11/19   Regalado, Belkys A, MD  pioglitazone (ACTOS) 45 MG tablet TAKE 1 TABLET (45 MG TOTAL) BY MOUTH DAILY. Patient not taking: Reported on 10/07/2019 01/05/17   Romero Belling, MD  vitamin B-12 1000 MCG tablet Take 1 tablet (1,000 mcg total) by mouth daily. 10/11/19   Regalado, Belkys A, MD    Allergies    Codeine sulfate and Metformin  Review of Systems   Review of Systems  Constitutional: Positive for fatigue. Negative for chills and fever.  HENT: Negative for congestion and rhinorrhea.   Eyes: Negative for redness and visual disturbance.  Respiratory: Positive for shortness of breath. Negative for wheezing.   Cardiovascular: Positive for chest pain. Negative for palpitations.  Gastrointestinal: Positive for nausea.  Negative for vomiting.  Genitourinary: Negative for dysuria and urgency.  Musculoskeletal: Negative for arthralgias and myalgias.  Skin: Negative for pallor and wound.  Neurological: Negative for dizziness and headaches.    Physical Exam Updated Vital Signs BP 134/74   Pulse 70   Temp 97.8 F (36.6 C) (Oral)   Resp 18   Ht  (1.651 m)   Wt 76.2 kg   SpO2 100%   BMI 27.94 kg/m   Physical Exam Vitals and nursing note reviewed.  Constitutional:      General: She is not in acute distress.    Appearance: She is well-developed. She is not diaphoretic.  HENT:     Head: Normocephalic and atraumatic.  Eyes:     Pupils: Pupils are equal, round, and reactive to light.  Cardiovascular:     Rate and Rhythm: Normal rate and regular rhythm.     Heart sounds: No murmur heard.  No friction rub. No gallop.   Pulmonary:     Effort: Pulmonary effort is normal.     Breath sounds: No wheezing or rales.  Chest:     Chest wall: No tenderness.  Abdominal:     General: There is no distension.     Palpations: Abdomen is soft.     Tenderness: There is no abdominal tenderness.  Musculoskeletal:        General: No tenderness.     Cervical back: Normal range of motion and neck supple.     Right lower leg: No tenderness. No edema.     Left lower leg: No tenderness. No edema.  Skin:    General: Skin is warm and dry.  Neurological:     Mental Status: She is alert and oriented to person, place, and time.  Psychiatric:        Behavior: Behavior normal.     ED Results / Procedures / Treatments   Labs (all labs ordered are listed, but only abnormal results are displayed) Labs Reviewed  BASIC METABOLIC PANEL - Abnormal; Notable for the following components:      Result Value   CO2 19 (*)    Glucose, Bld 455 (*)    BUN 33 (*)    Creatinine, Ser 1.12 (*)  GFR calc non Af Amer 52 (*)    GFR calc Af Amer 60 (*)    All other components within normal limits  CBC - Abnormal; Notable for  the following components:   Hemoglobin 9.8 (*)    HCT 33.2 (*)    MCV 77.6 (*)    MCH 22.9 (*)    MCHC 29.5 (*)    RDW 17.2 (*)    Platelets 413 (*)    All other components within normal limits  D-DIMER, QUANTITATIVE (NOT AT [facility]) - Abnormal; Notable for the following components:   D-Dimer, Quant 0.51 (*)    All other components within normal limits  HEMOGLOBIN A1C - Abnormal; Notable for the following components:   Hgb A1c MFr Bld 8.8 (*)    All other components within normal limits  IRON AND TIBC - Abnormal; Notable for the following components:   Iron 21 (*)    TIBC 577 (*)    Saturation Ratios 4 (*)    All other components within normal limits  TSH - Abnormal; Notable for the following components:   TSH 0.010 (*)    All other components within normal limits  LIPID PANEL - Abnormal; Notable for the following components:   Cholesterol 299 (*)    Triglycerides 447 (*)    All other components within normal limits  LDL CHOLESTEROL, DIRECT - Abnormal; Notable for the following components:   Direct LDL 158.8 (*)    All other components within normal limits  GLUCOSE, CAPILLARY - Abnormal; Notable for the following components:   Glucose-Capillary 260 (*)    All other components within normal limits  BASIC METABOLIC PANEL - Abnormal; Notable for the following components:   Glucose, Bld 309 (*)    BUN 27 (*)    All other components within normal limits  CBC - Abnormal; Notable for the following components:   RBC 3.78 (*)    Hemoglobin 8.6 (*)    HCT 28.9 (*)    MCV 76.5 (*)    MCH 22.8 (*)    MCHC 29.8 (*)    RDW 17.0 (*)    All other components within normal limits  GLUCOSE, CAPILLARY - Abnormal; Notable for the following components:   Glucose-Capillary 139 (*)    All other components within normal limits  GLUCOSE, CAPILLARY - Abnormal; Notable for the following components:   Glucose-Capillary 279 (*)    All other components within normal limits  T3, FREE - Abnormal;  Notable for the following components:   T3, Free 8.2 (*)    All other components within normal limits  GLUCOSE, CAPILLARY - Abnormal; Notable for the following components:   Glucose-Capillary 255 (*)    All other components within normal limits  GLUCOSE, CAPILLARY - Abnormal; Notable for the following components:   Glucose-Capillary 280 (*)    All other components within normal limits  GLUCOSE, CAPILLARY - Abnormal; Notable for the following components:   Glucose-Capillary 228 (*)    All other components within normal limits  CBC - Abnormal; Notable for the following components:   Hemoglobin 8.9 (*)    HCT 30.1 (*)    MCV 76.4 (*)    MCH 22.6 (*)    MCHC 29.6 (*)    RDW 16.8 (*)    All other components within normal limits  BASIC METABOLIC PANEL - Abnormal; Notable for the following components:   Glucose, Bld 226 (*)    All other components within normal limits  T4, FREE -  Abnormal; Notable for the following components:   Free T4 2.06 (*)    All other components within normal limits  GLUCOSE, CAPILLARY - Abnormal; Notable for the following components:   Glucose-Capillary 210 (*)    All other components within normal limits  GLUCOSE, CAPILLARY - Abnormal; Notable for the following components:   Glucose-Capillary 181 (*)    All other components within normal limits  GLUCOSE, CAPILLARY - Abnormal; Notable for the following components:   Glucose-Capillary 223 (*)    All other components within normal limits  GLUCOSE, CAPILLARY - Abnormal; Notable for the following components:   Glucose-Capillary 202 (*)    All other components within normal limits  GLUCOSE, CAPILLARY - Abnormal; Notable for the following components:   Glucose-Capillary 183 (*)    All other components within normal limits  CBC - Abnormal; Notable for the following components:   RBC 3.67 (*)    Hemoglobin 8.5 (*)    HCT 28.6 (*)    MCV 77.9 (*)    MCH 23.2 (*)    MCHC 29.7 (*)    RDW 17.1 (*)    All other  components within normal limits  GLUCOSE, CAPILLARY - Abnormal; Notable for the following components:   Glucose-Capillary 329 (*)    All other components within normal limits  GLUCOSE, CAPILLARY - Abnormal; Notable for the following components:   Glucose-Capillary 255 (*)    All other components within normal limits  GLUCOSE, CAPILLARY - Abnormal; Notable for the following components:   Glucose-Capillary 292 (*)    All other components within normal limits  GLUCOSE, CAPILLARY - Abnormal; Notable for the following components:   Glucose-Capillary 213 (*)    All other components within normal limits  CBG MONITORING, ED - Abnormal; Notable for the following components:   Glucose-Capillary 278 (*)    All other components within normal limits  TROPONIN I (HIGH SENSITIVITY) - Abnormal; Notable for the following components:   Troponin I (High Sensitivity) 19 (*)    All other components within normal limits  TROPONIN I (HIGH SENSITIVITY) - Abnormal; Notable for the following components:   Troponin I (High Sensitivity) 21 (*)    All other components within normal limits  SARS CORONAVIRUS 2 BY RT PCR (HOSPITAL ORDER, PERFORMED IN [place] [facility])  BETA-HYDROXYBUTYRIC ACID  HIV ANTIBODY (ROUTINE TESTING W REFLEX)  HEPARIN LEVEL (UNFRACTIONATED)  HEPARIN LEVEL (UNFRACTIONATED)  OCCULT BLOOD X 1 CARD TO LAB, STOOL  VITAMIN B12  THYROTROPIN RECEPTOR AUTOABS  SURGICAL PATHOLOGY  TROPONIN I (HIGH SENSITIVITY)    EKG EKG Interpretation  Date/Time:  Saturday October 07 2019 08:41:54 EDT Ventricular Rate:  98 PR Interval:  136 QRS Duration: 82 QT Interval:  336 QTC Calculation: 428 R Axis:   15 Text Interpretation: Normal sinus rhythm Minimal voltage criteria for LVH, may be normal variant ( R in aVL ) Nonspecific ST and T wave abnormality Abnormal ECG Since last tracing Non-specific ST-t changes Confirmed by Susy Frizzle [phone]) on 10/07/2019 8:44:08 AM   Radiology No results  found.  Procedures Procedures (including critical care time) Discussed smoking cessation with patient and was they were offerred resources to help stop.  Total time was 5 min CPT code 19147.   Medications Ordered in ED Medications  0.9 %  sodium chloride infusion (0 mLs Intravenous Stopped 10/08/19 1900)  sodium chloride flush (NS) 0.9 % injection 3 mL (3 mLs Intravenous Given 10/07/19 0952)  acetaminophen (TYLENOL) tablet 1,000 mg (1,000 mg Oral Given  10/07/19 0951)  oxyCODONE (Oxy IR/ROXICODONE) immediate release tablet 2.5 mg (2.5 mg Oral Given 10/07/19 1050)  iohexol (OMNIPAQUE) 350 MG/ML injection 100 mL (65 mLs Intravenous Contrast Given 10/07/19 1207)  HYDROmorphone (DILAUDID) injection 0.5 mg (0.5 mg Intravenous Given 10/07/19 1500)  heparin bolus via infusion 4,000 Units (4,000 Units Intravenous Bolus from Bag 10/07/19 1512)  ALPRAZolam (XANAX) tablet 0.25 mg (0.25 mg Oral Given 10/08/19 0034)  gabapentin (NEURONTIN) capsule 300 mg (300 mg Oral Given 10/08/19 0715)  ferumoxytol (FERAHEME) 510 mg in sodium chloride 0.9 % 100 mL IVPB (0 mg Intravenous Stopped 10/08/19 1900)  bisacodyl (DULCOLAX) EC tablet 10 mg (10 mg Oral Given 10/08/19 1445)  polyethylene glycol-electrolytes (NuLYTELY) solution 4,000 mL (4,000 mLs Oral Given 10/08/19 1443)    ED Course  I have reviewed the triage vital signs and the nursing notes.  Pertinent labs & imaging results that were available during my care of the patient were reviewed by me and considered in my medical decision making (see chart for details).    MDM Rules/Calculators/A&P                          64 yo F with a chief complaint of chest pain.  Her pain is exertional and associated with shortness of breath and fatigue.  Never had an MI before.  She has had a pulmonary embolism that was thought to be post C-section.  Initial troponin is negative.  EKG with some subtle ST depression in the inferior leads that was not seen on prior.  Will discuss with  cardiology.  I discussed case briefly with Dr. Jens Som, cardiology he independently evaluated the patient's ECG and did not feel that it was acutely ischemic.  Will discuss with medicine for admission.  D-dimer age-adjusted negative.  Acidosis without anion gap.  Hyperglycemia.  No significant anemia.  CRITICAL CARE Performed by: Rae Roam   Total critical care time: 35 minutes  Critical care time was exclusive of separately billable procedures and treating other patients.  Critical care was necessary to treat or prevent imminent or life-threatening deterioration.  Critical care was time spent personally by me on the following activities: development of treatment plan with patient and/or surrogate as well as nursing, discussions with consultants, evaluation of patient's response to treatment, examination of patient, obtaining history from patient or surrogate, ordering and performing treatments and interventions, ordering and review of laboratory studies, ordering and review of radiographic studies, pulse oximetry and re-evaluation of patient's condition.   The patients results and plan were reviewed and discussed.   Any x-rays performed were independently reviewed by myself.   Differential diagnosis were considered with the presenting HPI.  Medications  0.9 %  sodium chloride infusion (0 mLs Intravenous Stopped 10/08/19 1900)  sodium chloride flush (NS) 0.9 % injection 3 mL (3 mLs Intravenous Given 10/07/19 0952)  acetaminophen (TYLENOL) tablet 1,000 mg (1,000 mg Oral Given 10/07/19 0951)  oxyCODONE (Oxy IR/ROXICODONE) immediate release tablet 2.5 mg (2.5 mg Oral Given 10/07/19 1050)  iohexol (OMNIPAQUE) 350 MG/ML injection 100 mL (65 mLs Intravenous Contrast Given 10/07/19 1207)  HYDROmorphone (DILAUDID) injection 0.5 mg (0.5 mg Intravenous Given 10/07/19 1500)  heparin bolus via infusion 4,000 Units (4,000 Units Intravenous Bolus from Bag 10/07/19 1512)  ALPRAZolam (XANAX) tablet 0.25  mg (0.25 mg Oral Given 10/08/19 0034)  gabapentin (NEURONTIN) capsule 300 mg (300 mg Oral Given 10/08/19 0715)  ferumoxytol (FERAHEME) 510 mg in sodium chloride 0.9 % 100  mL IVPB (0 mg Intravenous Stopped 10/08/19 1900)  bisacodyl (DULCOLAX) EC tablet 10 mg (10 mg Oral Given 10/08/19 1445)  polyethylene glycol-electrolytes (NuLYTELY) solution 4,000 mL (4,000 mLs Oral Given 10/08/19 1443)    Vitals:   10/09/19 2112 10/10/19 0509 10/10/19 0755 10/10/19 1137  BP: 130/66 126/72 121/61 134/74  Pulse: 77 73 76 70  Resp: 16 16 18    Temp: 98.5 F (36.9 C) 98.3 F (36.8 C) 97.7 F (36.5 C) 97.8 F (36.6 C)  TempSrc: Oral Oral Oral Oral  SpO2: 100% 100% 99% 100%  Weight:  76.2 kg    Height:        Final diagnoses:  Chest pain with moderate risk for cardiac etiology    Admission/ observation were discussed with the admitting physician, patient and/or family and they are comfortable with the plan.    Final Clinical Impression(s) / ED Diagnoses Final diagnoses:  Chest pain with moderate risk for cardiac etiology    Rx / DC Orders ED Discharge Orders         Ordered    aspirin 81 MG chewable tablet  Daily     Discontinue  Reprint     10/10/19 0857    methimazole (TAPAZOLE) 10 MG tablet  Daily     Discontinue  Reprint     10/10/19 0857    nicotine (NICODERM CQ - DOSED IN MG/24 HOURS) 14 mg/24hr patch  Daily     Discontinue  Reprint     10/10/19 0857    pantoprazole (PROTONIX) 40 MG tablet  Daily     Discontinue  Reprint     10/10/19 0857    vitamin B-12 1000 MCG tablet  Daily     Discontinue  Reprint     10/10/19 0857    gabapentin (NEURONTIN) 100 MG capsule  2 times daily     Discontinue  Reprint     10/10/19 0857    metoprolol tartrate (LOPRESSOR) 25 MG tablet  2 times daily,   Status:  Discontinued     Reprint     10/10/19 0857    ferrous sulfate 325 (65 FE) MG tablet  Daily     Discontinue  Reprint     10/10/19 0903    nitroGLYCERIN (NITROSTAT) 0.4 MG SL tablet  Every 5 min PRN      Discontinue  Reprint     10/10/19 0905    metoprolol tartrate (LOPRESSOR) 25 MG tablet  2 times daily     Discontinue  Reprint     10/10/19 1120    Increase activity slowly     Discontinue     10/10/19 1121    Diet - low sodium heart healthy     Discontinue     10/10/19 1121           12/11/19, DO 10/07/19 1037    12/08/19, DO 10/17/19 1511

## 2019-10-07 NOTE — ED Triage Notes (Signed)
Pt reports sudden onset of sob and central chest pain while out walking this morning, also endorses bilateral calf pain. Denies any recent travel.  resp e/u nad.

## 2019-10-07 NOTE — ED Notes (Signed)
Pt to CT and US.

## 2019-10-07 NOTE — Progress Notes (Signed)
ANTICOAGULATION CONSULT NOTE  Pharmacy Consult for Heparin Indication: chest pain/ACS  Allergies  Allergen Reactions  . Codeine Sulfate Nausea And Vomiting and Other (See Comments)    dizziness  . Metformin Diarrhea and Other (See Comments)    fatigue    Patient Measurements: Height: 5\' 5"  (165.1 cm) Weight: 75.7 kg (166 lb 14.4 oz) IBW/kg (Calculated) : 57 Heparin Dosing Weight: 73.7 kg  Vital Signs: Temp: 98 F (36.7 C) (07/03 1940) Temp Source: Oral (07/03 1940) BP: 125/66 (07/03 1940) Pulse Rate: 76 (07/03 1940)  Labs: Recent Labs    10/07/19 0846 10/07/19 1027 10/07/19 2047  HGB 9.8*  --   --   HCT 33.2*  --   --   PLT 413*  --   --   HEPARINUNFRC  --   --  0.65  CREATININE 1.12*  --   --   TROPONINIHS 13 19*  --     Estimated Creatinine Clearance: 51 mL/min (A) (by C-G formula based on SCr of 1.12 mg/dL (H)).   Assessment: 64 yo W on heparin for Unstable angina. Patient has a hx of DVT & PE during pregnancy however is not on anticoagulation at this time.    First HL at goal 0.65. No bleed or issues with infusion per RN. Will decrease slightly to keep in goal.     Goal of Therapy:  Heparin level 0.3-0.7 units/ml Monitor platelets by anticoagulation protocol: Yes   Plan:  Decrease Heparin drip to 850 units/hr Monitor daily HL, CBC/plt Monitor for signs/symptoms of bleeding    75, PharmD, BCPS, BCCP Clinical Pharmacist  Please check AMION for all [facility] Pharmacy phone numbers After 10:00 PM, call Main Pharmacy [phone]

## 2019-10-07 NOTE — Consult Note (Addendum)
Cardiology Consultation:   Patient ID: Chelsea Gray MRN: [id number]; DOB: 1956-01-12  Admit date: 10/07/2019 Date of Consult: 10/07/2019  Primary Care Provider: Maurice Small, MD [facility] Cardiologist: No primary care provider on file. new (her husband is followed by Dr. Anne Gray. [facility] Electrophysiologist:  None    Patient Profile:   Chelsea Gray is a 64 y.o. female with a hx of DM-2, HTN, hx of DVT and PE during preg. who is being seen today for the evaluation of chest pain at the request of Dr. Chipper Gray.  History of Present Illness:   Ms. Albee with no cardiac hx but Dm, HTN developed acute pressure like chest pain this AM while walking, 7-8/10 , + palpitation and SOB.  She sat down without improvement.  She tried to walk again but very weak and SOB.  She has also had bilateral calf pain/ back of knees for 2 days .   Her heart was racing a hard beating with fast rate.   EKG:  The EKG was personally reviewed and demonstrates:  SR at 98 and LVH, inf ST depression mild- some increase since 2013. Telemetry:  Telemetry was personally reviewed and demonstrates:  SR  Labs, Na 135, K+ 3.5, glucose 455, BUN 33 Cr 1.12  Hs troponin 13 and 19  hgb 9.8, WBC 7.3, plts 413.  ddimer 0.51 having CTA of chest neg for PE, minimal atelectasis of post lung base, 2V CXR NAD Echo EF 65-70%, no RWMA, borderline LVH , RV normal trivial MR,  Venous dopplers no baker's cyst negative DVT.   BP 135/75, P 99 now BP 153/80 No pain now, no SOB    Past Medical History:  Diagnosis Date  . Blood transfusion    d/t hemmorrhage post c- section  . DEPRESSION 01/24/2009  . DM 01/24/2009  . 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 History of one miscarriage   . HYPERCHOLESTEROLEMIA 01/24/2009  . HYPERTENSION 01/24/2009  . Osteoarthritis   . Pulmonary embolism (HCC)    result of blood transfusion reaction 18 years ago after childbirth     Past Surgical History:  Procedure Laterality Date  . BREAST CYST EXCISION    . BREAST REDUCTION SURGERY    . CERVICAL CERCLAGE    . CESAREAN SECTION    . JOINT REPLACEMENT    . KNEE ARTHROPLASTY  07/14/2011   Procedure: COMPUTER ASSISTED TOTAL KNEE ARTHROPLASTY;  Surgeon: Chelsea Copa, MD;  Location: [facility] OR;  Service: Orthopedics;  Laterality: Left;  Left total knee arthroplasty  . REDUCTION MAMMAPLASTY Bilateral      Home Medications:  Prior to Admission medications   Medication Sig Start Date End Date Taking? Authorizing Provider  atorvastatin (LIPITOR) 40 MG tablet Take 40 mg by mouth daily. 07/13/19  Yes [provider]  FREESTYLE LITE test strip CHECK BLOOD SUGAR ONCE A DAY Patient taking differently: 1 each by Other route daily.  08/27/15  Yes Chelsea Belling, MD  hydrochlorothiazide (HYDRODIURIL) 12.5 MG tablet Take 12.5 mg by mouth every morning. 08/03/19  Yes [provider]  INVOKANA 300 MG TABS tablet TAKE 1 TABLET BY MOUTH EVERY DAY Patient taking differently: Take 300 mg by mouth daily before breakfast.  04/18/18  Yes Chelsea Belling, MD  metFORMIN (GLUCOPHAGE) 500 MG tablet Take 1 tablet (500 mg total) by mouth 2 (two) times daily with a meal. Patient taking differently: Take 500 mg by mouth daily with breakfast.  12/15/16  Yes Chelsea Belling, MD  NOVOTWIST 32G X 5 MM MISC USE DAILY FOR INJECTION Patient taking differently: 1 each by Other route daily.  05/21/15  Yes Chelsea Belling, MD  olmesartan (BENICAR) 20 MG tablet Take 20 mg by mouth daily.   Yes [provider]  TRULICITY 1.5 MG/0.5ML SOPN INJECT 1.5 MG INTO THE SKIN ONCE A WEEK. 09/21/16  Yes Chelsea Belling, MD  meloxicam (MOBIC) 15 MG tablet TAKE 1 TABLET BY MOUTH EVERY DAY Patient not taking: Reported on 10/07/2019 02/21/18   Chelsea Copa, MD  pioglitazone (ACTOS) 45 MG tablet TAKE 1 TABLET (45 MG TOTAL) BY MOUTH DAILY. Patient not taking: Reported on 10/07/2019 01/05/17   Chelsea Belling, MD     Inpatient Medications: Scheduled Meds: . atorvastatin  40 mg Oral Daily  . enoxaparin (LOVENOX) injection  40 mg Subcutaneous Q24H  . hydrochlorothiazide  12.5 mg Oral q morning - 10a  . insulin aspart  0-15 Units Subcutaneous TID WC  . insulin aspart  0-5 Units Subcutaneous QHS  . irbesartan  150 mg Oral Daily  . nicotine  14 mg Transdermal Daily   Continuous Infusions: . sodium chloride     PRN Meds: acetaminophen **OR** acetaminophen  Allergies:    Allergies  Allergen Reactions  . Codeine Sulfate Nausea And Vomiting and Other (See Comments)    dizziness  . Metformin Diarrhea and Other (See Comments)    fatigue    Social History:   Social History   Socioeconomic History  . Marital status: Married    Spouse name: Not on file  . Number of children: Not on file  . Years of education: Not on file  . Highest education level: Not on file  Occupational History  . Occupation: Neurosurgeon: [employer]  Tobacco Use  . Smoking status: Current Every Day Smoker    Packs/day: 0.10    Years: 6.00    Pack years: 0.60  . Smokeless tobacco: Never Used  Substance and Sexual Activity  . Alcohol use: Yes    Comment: once drink every few months  . Drug use: No  . Sexual activity: Not on file  Other Topics Concern  . Not on file  Social History Narrative  . Not on file   Social Determinants of Health   Financial Resource Strain:   . Difficulty of Paying Living Expenses:   Food Insecurity:   . Worried About Programme researcher, broadcasting/film/video in the Last Year:   . Barista in the Last Year:   Transportation Needs:   . Freight forwarder (Medical):   Marland Kitchen Lack of Transportation (Non-Medical):   Physical Activity:   . Days of Exercise per Week:   . Minutes of Exercise per Session:   Stress:   . Feeling of Stress :   Social Connections:   . Frequency of Communication with Friends and Family:   . Frequency of Social Gatherings with Friends and Family:   .  Attends Religious Services:   . Active Member of Clubs or Organizations:   . Attends Banker Meetings:   Marland Kitchen Marital Status:   Intimate Partner Violence:   . Fear of Current or Ex-Partner:   . Emotionally Abused:   Marland Kitchen Physically Abused:   . Sexually Abused:     Family History:    Family History  Problem Relation Age of Onset  . Diabetes Mother   . Hypertension Mother   . Diabetes  Father   . Arrhythmia Father   . Heart disease Father        CABG  . Breast cancer Sister   . Diabetes Sister   . Anesthesia problems Neg Hx      ROS:  Please see the history of present illness.  General:no colds or fevers, no weight changes Skin:no rashes or ulcers HEENT:no blurred vision, no congestion CV:see HPI PUL:see HPI GI:no diarrhea constipation or melena, no indigestion GU:no hematuria, no dysuria MS:no joint pain, no claudication Neuro:no syncope, no lightheadedness Endo:+ diabetes, no thyroid disease  All other ROS reviewed and negative.     Physical Exam/Data:   Vitals:   10/07/19 0943 10/07/19 1147 10/07/19 1202 10/07/19 1217  BP:  136/64 136/63 (!) 153/80  Pulse:  84 73   Resp:  15 11 14   SpO2:  100% 100%   Weight: 79.4 kg     Height: 5\' 5"  (1.651 m)      No intake or output data in the 24 hours ending 10/07/19 1328 Last 3 Weights 10/07/2019 12/15/2016 07/23/2016  Weight (lbs) 175 lb 199 lb 3.2 oz 209 lb  Weight (kg) 79.379 kg 90.357 kg 94.802 kg     Body mass index is 29.12 kg/m.  General:  Well nourished, well developed, in no acute distress HEENT: normal Lymph: no adenopathy Neck: no JVD Endocrine:  No thryomegaly Vascular: No carotid bruits; pedal pulses 1+ bilaterally  Cardiac:  normal S1, S2; RRR; no murmur gallup rub or click Lungs:  clear to auscultation bilaterally, no wheezing, rhonchi or rales  Abd: soft, nontender, no hepatomegaly  Ext: no edema Musculoskeletal:  No deformities, BUE and BLE strength normal and equal, no pain to palpation of  lower ext Skin: warm and dry  Neuro:  Alert and oriented X 3 MAE follows commands, no focal abnormalities noted Psych:  Normal affect    Relevant CV Studies: Echo 10/07/19 IMPRESSIONS    1. Left ventricular ejection fraction, by estimation, is 65 to 70%. The  left ventricle has normal function. The left ventricle has no regional  wall motion abnormalities. Left ventricular diastolic parameters are  indeterminate.  2. Right ventricular systolic function is normal. The right ventricular  size is normal. There is normal pulmonary artery systolic pressure. The  estimated right ventricular systolic pressure is 24.5 mmHg.  3. The mitral valve is grossly normal. Trivial mitral valve  regurgitation.  4. The aortic valve is tricuspid. Aortic valve regurgitation is not  visualized.  5. The inferior vena cava is normal in size with greater than 50%  respiratory variability, suggesting right atrial pressure of 3 mmHg.   FINDINGS  Left Ventricle: Left ventricular ejection fraction, by estimation, is 65  to 70%. The left ventricle has normal function. The left ventricle has no  regional wall motion abnormalities. The left ventricular internal cavity  size was normal in size. There is  borderline left ventricular hypertrophy. Left ventricular diastolic  parameters are indeterminate.   Right Ventricle: The right ventricular size is normal. No increase in  right ventricular wall thickness. Right ventricular systolic function is  normal. There is normal pulmonary artery systolic pressure. The tricuspid  regurgitant velocity is 2.32 m/s, and  with an assumed right atrial pressure of 3 mmHg, the estimated right  ventricular systolic pressure is 24.5 mmHg.   Left Atrium: Left atrial size was normal in size.   Right Atrium: Right atrial size was normal in size.   Pericardium: There is no evidence of  pericardial effusion.   Mitral Valve: The mitral valve is grossly normal. Trivial mitral  valve  regurgitation.   Tricuspid Valve: The tricuspid valve is grossly normal. Tricuspid valve  regurgitation is trivial.   Aortic Valve: The aortic valve is tricuspid. Aortic valve regurgitation is  not visualized. Mild aortic valve annular calcification.   Pulmonic Valve: The pulmonic valve was grossly normal. Pulmonic valve  regurgitation is trivial.   Aorta: The aortic root is normal in size and structure.   Venous: The inferior vena cava is normal in size with greater than 50%  respiratory variability, suggesting right atrial pressure of 3 mmHg.   IAS/Shunts: No atrial level shunt detected by color flow Doppler.     LEFT VENTRICLE  PLAX 2D  LVIDd:     4.20 cm Diastology  LVIDs:     2.40 cm LV e' lateral:  9.68 cm/s  LV PW:     1.00 cm LV E/e' lateral: 6.2  LV IVS:    1.00 cm LV e' medial:  5.98 cm/s  LVOT diam:   1.80 cm LV E/e' medial: 10.1  LV SV:     58  LV SV Index:  31  LVOT Area:   2.54 cm     RIGHT VENTRICLE  RV Basal diam: 2.80 cm  RV S prime:   12.30 cm/s  TAPSE (M-mode): 2.3 cm   LEFT ATRIUM       Index    RIGHT ATRIUM     Index  LA diam:    3.70 cm 1.98 cm/m RA Area:   9.66 cm  LA Vol (A2C):  38.2 ml 20.44 ml/m RA Volume:  18.50 ml 9.90 ml/m  LA Vol (A4C):  34.8 ml 18.62 ml/m  LA Biplane Vol: 36.7 ml 19.64 ml/m  AORTIC VALVE  LVOT Vmax:  98.30 cm/s  LVOT Vmean: 67.300 cm/s  LVOT VTI:  0.229 m    AORTA  Ao Root diam: 2.90 cm   MITRAL VALVE        TRICUSPID VALVE  MV Area (PHT): 3.99 cm   TR Peak grad:  21.5 mmHg  MV Decel Time: 190 msec   TR Vmax:    232.00 cm/s  MV E velocity: 60.20 cm/s  MV A velocity: 102.00 cm/s SHUNTS  MV E/A ratio: 0.59     Systemic VTI: 0.23 m               Systemic Diam: 1.80 cm   Nona Dell MD  Electronically signed by Nona Dell MD  Signature Date/Time: 10/07/2019/12:50:46 PM      Final      venous dopplers neg   Laboratory Data:  High Sensitivity Troponin:   Recent Labs  Lab 10/07/19 0846 10/07/19 1027  TROPONINIHS 13 19*     Chemistry Recent Labs  Lab 10/07/19 0846  NA 135  K 3.5  CL 101  CO2 19*  GLUCOSE 455*  BUN 33*  CREATININE 1.12*  CALCIUM 9.6  GFRNONAA 52*  GFRAA 60*  ANIONGAP 15    No results for input(s): PROT, ALBUMIN, AST, ALT, ALKPHOS, BILITOT in the last 168 hours. Hematology Recent Labs  Lab 10/07/19 0846  WBC 7.3  RBC 4.28  HGB 9.8*  HCT 33.2*  MCV 77.6*  MCH 22.9*  MCHC 29.5*  RDW 17.2*  PLT 413*   BNPNo results for input(s): BNP, PROBNP in the last 168 hours.  DDimer  Recent Labs  Lab 10/07/19 0945  DDIMER 0.51*  Radiology/Studies:  DG Chest 2 View  Result Date: 10/07/2019 CLINICAL DATA:  Central chest pain and shortness of breath EXAM: CHEST - 2 VIEW COMPARISON:  July 02, 2011 FINDINGS: The heart size and mediastinal contours are within normal limits. Both lungs are clear. The visualized skeletal structures are unremarkable. IMPRESSION: No active cardiopulmonary disease. Electronically Signed   By: Sherian Rein M.D.   On: 10/07/2019 09:37   CT ANGIO CHEST PE W OR WO CONTRAST  Result Date: 10/07/2019 CLINICAL DATA:  Shortness of breath.  Assess for pulmonary embolus. EXAM: CT ANGIOGRAPHY CHEST WITH CONTRAST TECHNIQUE: Multidetector CT imaging of the chest was performed using the standard protocol during bolus administration of intravenous contrast. Multiplanar CT image reconstructions and MIPs were obtained to evaluate the vascular anatomy. CONTRAST:  65mL OMNIPAQUE IOHEXOL 350 MG/ML SOLN COMPARISON:  Chest x-ray October 07, 2019 FINDINGS: Cardiovascular: Satisfactory opacification of the pulmonary arteries to the segmental level. No evidence of pulmonary embolism. The heart size is mildly enlarged. No pericardial effusion. Mediastinum/Nodes: No enlarged mediastinal, hilar, or axillary lymph nodes. Thyroid gland,  trachea, and esophagus demonstrate no significant findings. Lungs/Pleura: Minimal atelectasis of posterior lung bases are noted. There is no focal pneumonia or pleural effusion. Upper Abdomen: No acute abnormality. Musculoskeletal: Minimal degenerative joint changes of thoracic spine are noted. Review of the MIP images confirms the above findings. IMPRESSION: 1. No pulmonary embolus. 2. Minimal atelectasis of posterior lung bases. Electronically Signed   By: Sherian Rein M.D.   On: 10/07/2019 12:31   ECHOCARDIOGRAM COMPLETE  Result Date: 10/07/2019    ECHOCARDIOGRAM REPORT   Patient Name:   SEMAYA VIDA Date of Exam: 10/07/2019 Medical Rec #:  [id number]        Height:       65.0 in Accession #:    [id number]       Weight:       175.0 lb Date of Birth:  1956/02/11         BSA:          1.869 m Patient Age:    64 years         BP:           135/75 mmHg Patient Gender: F                HR:           99 bpm. Exam Location:  Inpatient Procedure: 2D Echo, Cardiac Doppler and Color Doppler Indications:    Chest pain  History:        Patient has no prior history of Echocardiogram examinations.                 Signs/Symptoms:Chest Pain; Risk Factors:Diabetes, Hypertension                 and Dyslipidemia. Pul.emboli.  Sonographer:    Lavenia Atlas Referring Phys: [id number] PING T ZHANG IMPRESSIONS  1. Left ventricular ejection fraction, by estimation, is 65 to 70%. The left ventricle has normal function. The left ventricle has no regional wall motion abnormalities. Left ventricular diastolic parameters are indeterminate.  2. Right ventricular systolic function is normal. The right ventricular size is normal. There is normal pulmonary artery systolic pressure. The estimated right ventricular systolic pressure is 24.5 mmHg.  3. The mitral valve is grossly normal. Trivial mitral valve regurgitation.  4. The aortic valve is tricuspid. Aortic valve regurgitation is not visualized.  5. The inferior vena cava is normal in  size  with greater than 50% respiratory variability, suggesting right atrial pressure of 3 mmHg. FINDINGS  Left Ventricle: Left ventricular ejection fraction, by estimation, is 65 to 70%. The left ventricle has normal function. The left ventricle has no regional wall motion abnormalities. The left ventricular internal cavity size was normal in size. There is  borderline left ventricular hypertrophy. Left ventricular diastolic parameters are indeterminate. Right Ventricle: The right ventricular size is normal. No increase in right ventricular wall thickness. Right ventricular systolic function is normal. There is normal pulmonary artery systolic pressure. The tricuspid regurgitant velocity is 2.32 m/s, and  with an assumed right atrial pressure of 3 mmHg, the estimated right ventricular systolic pressure is 24.5 mmHg. Left Atrium: Left atrial size was normal in size. Right Atrium: Right atrial size was normal in size. Pericardium: There is no evidence of pericardial effusion. Mitral Valve: The mitral valve is grossly normal. Trivial mitral valve regurgitation. Tricuspid Valve: The tricuspid valve is grossly normal. Tricuspid valve regurgitation is trivial. Aortic Valve: The aortic valve is tricuspid. Aortic valve regurgitation is not visualized. Mild aortic valve annular calcification. Pulmonic Valve: The pulmonic valve was grossly normal. Pulmonic valve regurgitation is trivial. Aorta: The aortic root is normal in size and structure. Venous: The inferior vena cava is normal in size with greater than 50% respiratory variability, suggesting right atrial pressure of 3 mmHg. IAS/Shunts: No atrial level shunt detected by color flow Doppler.  LEFT VENTRICLE PLAX 2D LVIDd:         4.20 cm  Diastology LVIDs:         2.40 cm  LV e' lateral:   9.68 cm/s LV PW:         1.00 cm  LV E/e' lateral: 6.2 LV IVS:        1.00 cm  LV e' medial:    5.98 cm/s LVOT diam:     1.80 cm  LV E/e' medial:  10.1 LV SV:         58 LV SV Index:    31 LVOT Area:     2.54 cm  RIGHT VENTRICLE RV Basal diam:  2.80 cm RV S prime:     12.30 cm/s TAPSE (M-mode): 2.3 cm LEFT ATRIUM             Index       RIGHT ATRIUM          Index LA diam:        3.70 cm 1.98 cm/m  RA Area:     9.66 cm LA Vol (A2C):   38.2 ml 20.44 ml/m RA Volume:   18.50 ml 9.90 ml/m LA Vol (A4C):   34.8 ml 18.62 ml/m LA Biplane Vol: 36.7 ml 19.64 ml/m  AORTIC VALVE LVOT Vmax:   98.30 cm/s LVOT Vmean:  67.300 cm/s LVOT VTI:    0.229 m  AORTA Ao Root diam: 2.90 cm MITRAL VALVE                TRICUSPID VALVE MV Area (PHT): 3.99 cm     TR Peak grad:   21.5 mmHg MV Decel Time: 190 msec     TR Vmax:        232.00 cm/s MV E velocity: 60.20 cm/s MV A velocity: 102.00 cm/s  SHUNTS MV E/A ratio:  0.59         Systemic VTI:  0.23 m  Systemic Diam: 1.80 cm Nona Dell MD Electronically signed by Nona Dell MD Signature Date/Time: 10/07/2019/12:50:46 PM    Final    VAS [place] LOWER EXTREMITY VENOUS (DVT)  Result Date: 10/07/2019  Lower Venous DVTStudy Indications: Pain.  Comparison Study: Prior negative left lower extremity venous duplex from                   02/04/12 is available for comparison. Performing Technologist: Sherren Kerns RVS  Examination Guidelines: A complete evaluation includes B-mode imaging, spectral Doppler, color Doppler, and power Doppler as needed of all accessible portions of each vessel. Bilateral testing is considered an integral part of a complete examination. Limited examinations for reoccurring indications may be performed as noted. The reflux portion of the exam is performed with the patient in reverse Trendelenburg.  +---------+---------------+---------+-----------+----------+--------------+ RIGHT    CompressibilityPhasicitySpontaneityPropertiesThrombus Aging +---------+---------------+---------+-----------+----------+--------------+ CFV      Full           Yes      Yes                                  +---------+---------------+---------+-----------+----------+--------------+ SFJ      Full                                                        +---------+---------------+---------+-----------+----------+--------------+ FV Prox  Full                                                        +---------+---------------+---------+-----------+----------+--------------+ FV Mid   Full                                                        +---------+---------------+---------+-----------+----------+--------------+ FV DistalFull                                                        +---------+---------------+---------+-----------+----------+--------------+ PFV      Full                                                        +---------+---------------+---------+-----------+----------+--------------+ POP      Full           Yes      Yes                                 +---------+---------------+---------+-----------+----------+--------------+ PTV      Full                                                        +---------+---------------+---------+-----------+----------+--------------+  PERO     Full                                                        +---------+---------------+---------+-----------+----------+--------------+   +---------+---------------+---------+-----------+----------+--------------+ LEFT     CompressibilityPhasicitySpontaneityPropertiesThrombus Aging +---------+---------------+---------+-----------+----------+--------------+ CFV      Full           Yes      Yes                                 +---------+---------------+---------+-----------+----------+--------------+ SFJ      Full                                                        +---------+---------------+---------+-----------+----------+--------------+ FV Prox  Full                                                         +---------+---------------+---------+-----------+----------+--------------+ FV Mid   Full                                                        +---------+---------------+---------+-----------+----------+--------------+ FV DistalFull                                                        +---------+---------------+---------+-----------+----------+--------------+ PFV      Full                                                        +---------+---------------+---------+-----------+----------+--------------+ POP      Full           Yes      Yes                                 +---------+---------------+---------+-----------+----------+--------------+ PTV      Full                                                        +---------+---------------+---------+-----------+----------+--------------+ PERO     Full                                                        +---------+---------------+---------+-----------+----------+--------------+  Summary: BILATERAL: - No evidence of deep vein thrombosis seen in the lower extremities, bilaterally. - RIGHT: - No cystic structure found in the popliteal fossa.  LEFT: - No cystic structure found in the popliteal fossa.  *See table(s) above for measurements and observations.    Preliminary        7   Assessment and Plan:   1. Chest pain troponin neg but symptoms are worrisome for CAD/Angina with DM, HTN, HLD, tobacco hx and FH of CAD.  No PE no DVT Currently no pain.  Dr. Jens Som to see.  Possible cardiac cath vs. Cardiac CTA.  2. HTN on HCTZ at home and benicar 20 3. HLD on lipitor   4. DM-2 per IM today elevated, she tells me it is never this high      For questions or updates, please contact [facility] Please consult [email or web address] for contact info under   Signed, Nada Boozer, NP  10/07/2019 1:28 PM As above, patient seen and examined.  Briefly she is a 64 year old female with past medical history of diabetes  mellitus, hypertension, tobacco abuse, prior pulmonary embolus for evaluation of unstable angina.  Patient typically does not have dyspnea on exertion, orthopnea, PND, pedal edema, exertional chest pain or syncope.  While she was out on her morning walk this morning she developed substernal chest pressure without radiation.  There was associated nausea and dyspnea but no diaphoresis.  The pain was not pleuritic.  She continued to walk and the pain worsened and became more severe with total duration of approximately 20 minutes.  After coming to a stopping point her pain resolved after 5 minutes.  She has been pain-free.  She has been admitted and cardiology asked to evaluate.  CTA shows no pulmonary embolus.  Troponin13 and 19.  Hemoglobin 9.8 with MCV 77.6.  Glucose 455.  Electrocardiogram shows sinus rhythm, left ventricular hypertrophy and nonspecific ST changes.  1 unstable angina-symptoms are concerning and patient has multiple risk factors including greater than 10 years of diabetes mellitus.  Plan to continue to cycle enzymes.  Treat with aspirin, heparin, beta-blocker and statin.  I feel definitive evaluation is warranted.  Plan cardiac catheterization.  The risks and benefits including myocardial infarction, CVA and death discussed and she agrees to proceed.  2 hypertension-plan to continue present medications other than hydrochlorothiazide which will be held prior to catheterization.  Add metoprolol both for blood pressure and for angina.  3 hyperlipidemia-continue Lipitor.  4 diabetes mellitus-glucose elevated.  Per primary care.  Hold Glucophage prior to catheterization in 48 hours after.  5 microcytic anemia-patient will need further evaluation once cardiac work-up complete.  We will leave to primary care.  Olga Millers, MD

## 2019-10-07 NOTE — H&P (Signed)
History and Physical    Chelsea Gray:[id number] DOB: 1955/11/22 DOA: 10/07/2019  PCP: Maurice Small, MD (Confirm with patient/family/NH records and if not entered, this has to be entered at [facility] point of entry) Patient coming from: Home  I have personally briefly reviewed patient's old medical records in [facility]  Chief Complaint: Chest pain, SOB  HPI: Chelsea Gray is a 64 y.o. female with medical history significant of IIDM, HTN, remote Hx of DVT and PE during pregnancy, presented with new onset chest pain.  Patient is doing her routine morning walks this morning, and about 5 minutes walk, she started to feel pressure-like chest pain centrally located, 7-8 over 10, nonradiating, associated with strong feeling of palpitation and shortness of breath.  She had to sit down right away and chest pain improved but never went away, and she tried to walk again but felt very tired and SOB got worsened again and came to ED. she also had 2 days of bilateral calf pain, dull like, worsened with walking and relieved with resting. ED Course: D-dimer elevated to 0.5, troponin negative, EKG showed T wave flattening in V4 through V6.  Cardiology review patient EKG and blood work and recommend outpatient stress test. Review of Systems: As per HPI otherwise 10 point review of systems negative.   Past Medical History:  Diagnosis Date  . Blood transfusion    d/t hemmorrhage post c- section  . DEPRESSION 01/24/2009  . DM 01/24/2009  . 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 History of one miscarriage   . HYPERCHOLESTEROLEMIA 01/24/2009  . HYPERTENSION 01/24/2009  . Osteoarthritis   . Pulmonary embolism (HCC)    result of blood transfusion reaction 18 years ago after childbirth    Past Surgical History:  Procedure Laterality Date  . BREAST CYST EXCISION    . BREAST REDUCTION SURGERY    . CERVICAL CERCLAGE    . CESAREAN SECTION    . JOINT  REPLACEMENT    . KNEE ARTHROPLASTY  07/14/2011   Procedure: COMPUTER ASSISTED TOTAL KNEE ARTHROPLASTY;  Surgeon: Cammy Copa, MD;  Location: [facility] OR;  Service: Orthopedics;  Laterality: Left;  Left total knee arthroplasty  . REDUCTION MAMMAPLASTY Bilateral      reports that she has been smoking. She has a 0.60 pack-year smoking history. She does not have any smokeless tobacco history on file. She reports current alcohol use. She reports that she does not use drugs.  Allergies  Allergen Reactions  . Codeine Sulfate Nausea And Vomiting and Other (See Comments)    dizziness  . Metformin Diarrhea and Other (See Comments)    fatigue    Family History  Problem Relation Age of Onset  . Diabetes Mother   . Diabetes Father   . Breast cancer Sister   . Anesthesia problems Neg Hx      Prior to Admission medications   Medication Sig Start Date End Date Taking? Authorizing Provider  atorvastatin (LIPITOR) 40 MG tablet Take 40 mg by mouth daily. 07/13/19  Yes [provider]  FREESTYLE LITE test strip CHECK BLOOD SUGAR ONCE A DAY Patient taking differently: 1 each by Other route daily.  08/27/15  Yes Romero Belling, MD  hydrochlorothiazide (HYDRODIURIL) 12.5 MG tablet Take 12.5 mg by mouth every morning. 08/03/19  Yes [provider]  INVOKANA 300 MG TABS tablet TAKE 1 TABLET BY MOUTH EVERY DAY Patient taking differently: Take 300 mg  by mouth daily before breakfast.  04/18/18  Yes Romero Belling, MD  metFORMIN (GLUCOPHAGE) 500 MG tablet Take 1 tablet (500 mg total) by mouth 2 (two) times daily with a meal. Patient taking differently: Take 500 mg by mouth daily with breakfast.  12/15/16  Yes Romero Belling, MD  NOVOTWIST 32G X 5 MM MISC USE DAILY FOR INJECTION Patient taking differently: 1 each by Other route daily.  05/21/15  Yes Romero Belling, MD  olmesartan (BENICAR) 20 MG tablet Take 20 mg by mouth daily.   Yes [provider]  TRULICITY 1.5 MG/0.5ML SOPN INJECT 1.5 MG  INTO THE SKIN ONCE A WEEK. 09/21/16  Yes Romero Belling, MD  meloxicam (MOBIC) 15 MG tablet TAKE 1 TABLET BY MOUTH EVERY DAY Patient not taking: Reported on 10/07/2019 02/21/18   Cammy Copa, MD  pioglitazone (ACTOS) 45 MG tablet TAKE 1 TABLET (45 MG TOTAL) BY MOUTH DAILY. Patient not taking: Reported on 10/07/2019 01/05/17   Romero Belling, MD    Physical Exam: Vitals:   10/07/19 0841 10/07/19 0943  BP: 135/75   Pulse: 99   Resp: 16   SpO2: 100%   Weight:  79.4 kg  Height:  5\' 5"  (1.651 m)    Constitutional: NAD, calm, comfortable Vitals:   10/07/19 0841 10/07/19 0943  BP: 135/75   Pulse: 99   Resp: 16   SpO2: 100%   Weight:  79.4 kg  Height:  5\' 5"  (1.651 m)   Eyes: PERRL, lids and conjunctivae normal ENMT: Mucous membranes are moist. Posterior pharynx clear of any exudate or lesions.Normal dentition.  Neck: normal, supple, no masses, no thyromegaly Respiratory: clear to auscultation bilaterally, no wheezing, no crackles. Normal respiratory effort. No accessory muscle use.  Cardiovascular: Regular rate and rhythm, no murmurs / rubs / gallops. No extremity edema. 2+ pedal pulses. No carotid bruits.  Abdomen: no tenderness, no masses palpated. No hepatosplenomegaly. Bowel sounds positive.  Musculoskeletal: no clubbing / cyanosis. No joint deformity upper and lower extremities. Good ROM, no contractures. Normal muscle tone.  Skin: no rashes, lesions, ulcers. No induration Neurologic: CN 2-12 grossly intact. Sensation intact, DTR normal. Strength 5/5 in all 4.  Psychiatric: Normal judgment and insight. Alert and oriented x 3. Normal mood.    Labs on Admission: I have personally reviewed following labs and imaging studies  CBC: Recent Labs  Lab 10/07/19 0846  WBC 7.3  HGB 9.8*  HCT 33.2*  MCV 77.6*  PLT 413*   Basic Metabolic Panel: Recent Labs  Lab 10/07/19 0846  NA 135  K 3.5  CL 101  CO2 19*  GLUCOSE 455*  BUN 33*  CREATININE 1.12*  CALCIUM 9.6    GFR: Estimated Creatinine Clearance: 52.2 mL/min (A) (by C-G formula based on SCr of 1.12 mg/dL (H)). Liver Function Tests: No results for input(s): AST, ALT, ALKPHOS, BILITOT, PROT, ALBUMIN in the last 168 hours. No results for input(s): LIPASE, AMYLASE in the last 168 hours. No results for input(s): AMMONIA in the last 168 hours. Coagulation Profile: No results for input(s): INR, PROTIME in the last 168 hours. Cardiac Enzymes: No results for input(s): CKTOTAL, CKMB, CKMBINDEX, TROPONINI in the last 168 hours. BNP (last 3 results) No results for input(s): PROBNP in the last 8760 hours. HbA1C: No results for input(s): HGBA1C in the last 72 hours. CBG: No results for input(s): GLUCAP in the last 168 hours. Lipid Profile: No results for input(s): CHOL, HDL, LDLCALC, TRIG, CHOLHDL, LDLDIRECT in the last 72 hours. Thyroid  Function Tests: No results for input(s): TSH, T4TOTAL, FREET4, T3FREE, THYROIDAB in the last 72 hours. Anemia Panel: No results for input(s): VITAMINB12, FOLATE, FERRITIN, TIBC, IRON, RETICCTPCT in the last 72 hours. Urine analysis:    Component Value Date/Time   COLORURINE AMBER (A) 02/16/2016 1621   APPEARANCEUR TURBID (A) 02/16/2016 1621   LABSPEC 1.036 (H) 02/16/2016 1621   PHURINE 8.0 02/16/2016 1621   GLUCOSEU >1000 (A) 02/16/2016 1621   GLUCOSEU NEGATIVE 12/24/2010 0931   HGBUR NEGATIVE 02/16/2016 1621   BILIRUBINUR NEGATIVE 02/16/2016 1621   KETONESUR NEGATIVE 02/16/2016 1621   PROTEINUR 30 (A) 02/16/2016 1621   UROBILINOGEN 0.2 07/02/2011 1355   NITRITE NEGATIVE 02/16/2016 1621   LEUKOCYTESUR NEGATIVE 02/16/2016 1621    Radiological Exams on Admission: DG Chest 2 View  Result Date: 10/07/2019 CLINICAL DATA:  Central chest pain and shortness of breath EXAM: CHEST - 2 VIEW COMPARISON:  July 02, 2011 FINDINGS: The heart size and mediastinal contours are within normal limits. Both lungs are clear. The visualized skeletal structures are unremarkable.  IMPRESSION: No active cardiopulmonary disease. Electronically Signed   By: Sherian Rein M.D.   On: 10/07/2019 09:37    EKG: Independently reviewed.  Flattening of T waves V4 through V6  Assessment/Plan Active Problems:   Chest pain  (please populate well all problems here in Problem List. (For example, if patient is on BP meds at home and you resume or decide to hold them, it is a problem that needs to be her. Same for CAD, COPD, HLD and so on)  Chest pain rule out ACS -Initial troponin negative, EKG showed flattening of T waves.  On-call cardiologist Dr. Jens Som reviewed EKG and suggest the changes are related to LVH.  Will order echocardiogram and repeat troponin tonight, if there is no regional wall mobility issue, patient can follow-up cardiology as outpatient, otherwise reconsult cardiology for inpatient stress test -Elevated D-dimer, she does have a risk of clotting, she had DVT history during pregnancy, and she is a smoker.  Will order CTA to rule out PE.  Hydration for 12 hours for kidney protection, also order DVT study.  Bilateral calf pain -Check DVT study  IIDM -Hold Metformin for 72 hours since pt will have CTA -Patient reported most recent A1c 7.23 months ago.  Hypertension -Continue HCTZ and ARB  Cigarette smoker -Educated to quit  Microcytic anemia, chronic -Check iron study, outpatient GI work-up  DVT prophylaxis: Lovenox Code Status: Full Family Communication: Husband at bedside Disposition Plan: Likely can home home within 24 hours after CTA and echocardiogram done Consults called: Cardiology Dr. Jens Som Admission status: Telemetry observation   Emeline General MD [facility] Pager [phone]   10/07/2019, 11:32 AM

## 2019-10-07 NOTE — Progress Notes (Signed)
  Echocardiogram 2D Echocardiogram has been performed.  Chelsea Gray 10/07/2019, 12:34 PM

## 2019-10-07 NOTE — Progress Notes (Signed)
VASCULAR LAB    Bilateral lower extremity venous duplex completed.    Preliminary report:  See CV proc for preliminary results.  Fergus Throne, RVT 10/07/2019, 12:25 PM

## 2019-10-07 NOTE — ED Notes (Signed)
Attempted report 

## 2019-10-08 DIAGNOSIS — Z96652 Presence of left artificial knee joint: Secondary | ICD-10-CM | POA: Diagnosis present

## 2019-10-08 DIAGNOSIS — M79662 Pain in left lower leg: Secondary | ICD-10-CM | POA: Diagnosis present

## 2019-10-08 DIAGNOSIS — D509 Iron deficiency anemia, unspecified: Secondary | ICD-10-CM | POA: Diagnosis not present

## 2019-10-08 DIAGNOSIS — K635 Polyp of colon: Secondary | ICD-10-CM | POA: Diagnosis present

## 2019-10-08 DIAGNOSIS — K31811 Angiodysplasia of stomach and duodenum with bleeding: Secondary | ICD-10-CM | POA: Diagnosis present

## 2019-10-08 DIAGNOSIS — I200 Unstable angina: Secondary | ICD-10-CM | POA: Diagnosis not present

## 2019-10-08 DIAGNOSIS — E1165 Type 2 diabetes mellitus with hyperglycemia: Secondary | ICD-10-CM | POA: Diagnosis present

## 2019-10-08 DIAGNOSIS — I2510 Atherosclerotic heart disease of native coronary artery without angina pectoris: Secondary | ICD-10-CM | POA: Diagnosis present

## 2019-10-08 DIAGNOSIS — R079 Chest pain, unspecified: Secondary | ICD-10-CM | POA: Diagnosis present

## 2019-10-08 DIAGNOSIS — E0590 Thyrotoxicosis, unspecified without thyrotoxic crisis or storm: Secondary | ICD-10-CM | POA: Diagnosis present

## 2019-10-08 DIAGNOSIS — I10 Essential (primary) hypertension: Secondary | ICD-10-CM | POA: Diagnosis present

## 2019-10-08 DIAGNOSIS — R252 Cramp and spasm: Secondary | ICD-10-CM | POA: Diagnosis present

## 2019-10-08 DIAGNOSIS — R634 Abnormal weight loss: Secondary | ICD-10-CM | POA: Diagnosis not present

## 2019-10-08 DIAGNOSIS — M79661 Pain in right lower leg: Secondary | ICD-10-CM | POA: Diagnosis present

## 2019-10-08 DIAGNOSIS — E785 Hyperlipidemia, unspecified: Secondary | ICD-10-CM | POA: Diagnosis present

## 2019-10-08 DIAGNOSIS — E781 Pure hyperglyceridemia: Secondary | ICD-10-CM | POA: Diagnosis present

## 2019-10-08 DIAGNOSIS — Z20822 Contact with and (suspected) exposure to covid-19: Secondary | ICD-10-CM | POA: Diagnosis present

## 2019-10-08 DIAGNOSIS — K222 Esophageal obstruction: Secondary | ICD-10-CM | POA: Diagnosis present

## 2019-10-08 DIAGNOSIS — F419 Anxiety disorder, unspecified: Secondary | ICD-10-CM | POA: Diagnosis present

## 2019-10-08 DIAGNOSIS — E1142 Type 2 diabetes mellitus with diabetic polyneuropathy: Secondary | ICD-10-CM | POA: Diagnosis present

## 2019-10-08 DIAGNOSIS — K259 Gastric ulcer, unspecified as acute or chronic, without hemorrhage or perforation: Secondary | ICD-10-CM | POA: Diagnosis present

## 2019-10-08 DIAGNOSIS — E7800 Pure hypercholesterolemia, unspecified: Secondary | ICD-10-CM | POA: Diagnosis present

## 2019-10-08 DIAGNOSIS — K648 Other hemorrhoids: Secondary | ICD-10-CM | POA: Diagnosis present

## 2019-10-08 DIAGNOSIS — R6881 Early satiety: Secondary | ICD-10-CM | POA: Diagnosis present

## 2019-10-08 DIAGNOSIS — K621 Rectal polyp: Secondary | ICD-10-CM | POA: Diagnosis present

## 2019-10-08 DIAGNOSIS — F17210 Nicotine dependence, cigarettes, uncomplicated: Secondary | ICD-10-CM | POA: Diagnosis present

## 2019-10-08 DIAGNOSIS — D500 Iron deficiency anemia secondary to blood loss (chronic): Secondary | ICD-10-CM | POA: Diagnosis present

## 2019-10-08 DIAGNOSIS — K449 Diaphragmatic hernia without obstruction or gangrene: Secondary | ICD-10-CM | POA: Diagnosis present

## 2019-10-08 LAB — BASIC METABOLIC PANEL
Anion gap: 9 (ref 5–15)
BUN: 27 mg/dL — ABNORMAL HIGH (ref 8–23)
CO2: 25 mmol/L (ref 22–32)
Calcium: 9.3 mg/dL (ref 8.9–10.3)
Chloride: 102 mmol/L (ref 98–111)
Creatinine, Ser: 0.83 mg/dL (ref 0.44–1.00)
GFR calc Af Amer: >60 mL/min (ref >60)
GFR calc non Af Amer: >60 mL/min (ref >60)
Glucose, Bld: 309 mg/dL — ABNORMAL HIGH (ref 70–99)
Potassium: 4.5 mmol/L (ref 3.5–5.1)
Sodium: 136 mmol/L (ref 135–145)

## 2019-10-08 LAB — GLUCOSE, CAPILLARY
Glucose-Capillary: 279 mg/dL — ABNORMAL HIGH (ref 70–99)
Glucose-Capillary: 255 mg/dL — ABNORMAL HIGH (ref 70–99)
Glucose-Capillary: 280 mg/dL — ABNORMAL HIGH (ref 70–99)
Glucose-Capillary: 228 mg/dL — ABNORMAL HIGH (ref 70–99)
Glucose-Capillary: 210 mg/dL — ABNORMAL HIGH (ref 70–99)
Glucose-Capillary: 181 mg/dL — ABNORMAL HIGH (ref 70–99)

## 2019-10-08 LAB — OCCULT BLOOD X 1 CARD TO LAB, STOOL: Fecal Occult Bld: NEGATIVE

## 2019-10-08 LAB — CBC
HCT: 28.9 % — ABNORMAL LOW (ref 36.0–46.0)
Hemoglobin: 8.6 g/dL — ABNORMAL LOW (ref 12.0–15.0)
MCH: 22.8 pg — ABNORMAL LOW (ref 26.0–34.0)
MCHC: 29.8 g/dL — ABNORMAL LOW (ref 30.0–36.0)
MCV: 76.5 fL — ABNORMAL LOW (ref 80.0–100.0)
Platelets: 349 10*3/uL (ref 150–400)
RBC: 3.78 MIL/uL — ABNORMAL LOW (ref 3.87–5.11)
RDW: 17 % — ABNORMAL HIGH (ref 11.5–15.5)
WBC: 7.5 10*3/uL (ref 4.0–10.5)
nRBC: 0 % (ref 0.0–0.2)

## 2019-10-08 LAB — HEPARIN LEVEL (UNFRACTIONATED): Heparin Unfractionated: 0.47 IU/mL (ref 0.30–0.70)

## 2019-10-08 MED ORDER — GABAPENTIN 100 MG PO CAPS
200.0000 mg | ORAL_CAPSULE | Freq: Two times a day (BID) | ORAL | Status: DC
  Administered 2019-10-08 – 2019-10-10 (×4): 200 mg via ORAL
  Filled 2019-10-08 (×4): qty 2

## 2019-10-08 MED ORDER — GABAPENTIN 300 MG PO CAPS
300.0000 mg | ORAL_CAPSULE | Freq: Once | ORAL | Status: AC
  Administered 2019-10-08: 300 mg via ORAL
  Filled 2019-10-08: qty 1

## 2019-10-08 MED ORDER — PEG 3350-KCL-NA BICARB-NACL 420 G PO SOLR
4000.0000 mL | Freq: Once | ORAL | Status: AC
  Administered 2019-10-08: 4000 mL via ORAL
  Filled 2019-10-08: qty 4000

## 2019-10-08 MED ORDER — TRAMADOL HCL 50 MG PO TABS
50.0000 mg | ORAL_TABLET | Freq: Two times a day (BID) | ORAL | Status: DC | PRN
  Administered 2019-10-08 – 2019-10-09 (×3): 50 mg via ORAL
  Filled 2019-10-08 (×3): qty 1

## 2019-10-08 MED ORDER — BISACODYL 5 MG PO TBEC
10.0000 mg | DELAYED_RELEASE_TABLET | Freq: Once | ORAL | Status: AC
  Administered 2019-10-08: 10 mg via ORAL
  Filled 2019-10-08: qty 2

## 2019-10-08 MED ORDER — PANTOPRAZOLE SODIUM 40 MG PO TBEC
40.0000 mg | DELAYED_RELEASE_TABLET | Freq: Every day | ORAL | Status: DC
  Administered 2019-10-08 – 2019-10-10 (×3): 40 mg via ORAL
  Filled 2019-10-08 (×3): qty 1

## 2019-10-08 MED ORDER — INSULIN GLARGINE 100 UNIT/ML ~~LOC~~ SOLN
10.0000 [IU] | Freq: Every day | SUBCUTANEOUS | Status: DC
  Administered 2019-10-09 – 2019-10-10 (×2): 10 [IU] via SUBCUTANEOUS
  Filled 2019-10-08 (×2): qty 0.1

## 2019-10-08 MED ORDER — SODIUM CHLORIDE 0.9 % IV SOLN
510.0000 mg | Freq: Once | INTRAVENOUS | Status: AC
  Administered 2019-10-08: 510 mg via INTRAVENOUS
  Filled 2019-10-08: qty 17

## 2019-10-08 MED ORDER — CANAGLIFLOZIN 300 MG PO TABS: 300.0000 mg | ORAL_TABLET | Freq: Every day | ORAL | Status: DC

## 2019-10-08 NOTE — Progress Notes (Addendum)
PROGRESS NOTE    Chelsea Gray  IWO:[id number] DOB: 02/18/1956 DOA: 10/07/2019 PCP: Maurice Small, MD   Brief Narrative: 64 year old with past medical history significant for insulin-dependent diabetes, hypertension, remote history of DVT and PE during pregnancy, presents with new onset chest pain.  Patient started on exertion during her morning walk pressure-like, 7 out of 10 associated with shortness of breath and palpitation.  She had mildly elevated D-dimer, EKG with T wave flattening.   Assessment & Plan:   Active Problems:   Diabetes (HCC)   HYPERCHOLESTEROLEMIA   SMOKER   Essential hypertension   Chest pain with moderate risk for cardiac etiology   Chest pain   Neuropathy   Iron deficiency anemia due to chronic blood loss  1-Chest pain/Angina;  Plan for heart cath on Tuesday Cardiology following Chest pain-free Full: No wall motion abnormality, ejection fraction 70% CT angio chest negative for PE. HLD; started on statins.   Anemia, iron deficiency: Patient report 30 pound weight loss in 6 months. Plan for IV iron GI evaluation prior to cath  Abnormal thyroid function test: Low TSH Check free T3, free T4  Bilateral calf pain: Dopplers negative for DVT  Diabetes type 2 hyperglycemia, uncontrolled Hb A1c 8.8 _Sliding scale insulin Resume Invokana Start low-dose Lantus  Hypertension; Continue with Avapro metoprolol  History of neuropathy; continue with gabapentin  Current smoker     Estimated body mass index is 27.81 kg/m as calculated from the following:   Height as of this encounter: 5\' 5"  (1.651 m).   Weight as of this encounter: 75.8 kg.   DVT prophylaxis: SCDs undergoing evaluation for anemia Code Status: Full code Family Communication: Discussed with patient Disposition Plan:  Status is: Inpatient  Dispo: The patient is from: Home              Anticipated d/c is to: Home              Anticipated d/c date is: 3 days               Patient currently is not medically stable to d/c.  Patient presents with angina plan for heart cath on Tuesday after GI evaluation for anemia        Consultants:   Cardiology  GI  Procedures:   Echo: Normal ejection fraction  Antimicrobials:    Subjective: Patient denies chest pain.  She reports 30 pound weight loss in the last 6 months to a year.  She denies melena, hematemesis hematochezia  Objective: Vitals:   10/07/19 1940 10/07/19 2223 10/08/19 0029 10/08/19 0328  BP: 125/66 133/60 (!) 117/54 121/63  Pulse: 76 70 69 66  Resp: 18  16 18   Temp: 98 F (36.7 C)  98.2 F (36.8 C) 98.3 F (36.8 C)  TempSrc: Oral  Oral Oral  SpO2: 98%  100% 98%  Weight:    75.8 kg  Height:        Intake/Output Summary (Last 24 hours) at 10/08/2019 1510 Last data filed at 10/07/2019 1930 Gross per 24 hour  Intake 636.58 ml  Output --  Net 636.58 ml   Filed Weights   10/07/19 0943 10/07/19 1538 10/08/19 0328  Weight: 79.4 kg 75.7 kg 75.8 kg    Examination:  General exam: Appears calm and comfortable  Respiratory system: Clear to auscultation. Respiratory effort normal. Cardiovascular system: S1 & S2 heard, RRR. No JVD, murmurs, rubs, gallops or clicks. No pedal edema. Gastrointestinal system: Abdomen is nondistended, soft and nontender. No  organomegaly or masses felt. Normal bowel sounds heard. Central nervous system: Alert and oriented. No focal neurological deficits. Extremities: Symmetric 5 x 5 power. Skin: No rashes, lesions or ulcers   Data Reviewed: I have personally reviewed following labs and imaging studies  CBC: Recent Labs  Lab 10/07/19 0846 10/08/19 0306  WBC 7.3 7.5  HGB 9.8* 8.6*  HCT 33.2* 28.9*  MCV 77.6* 76.5*  PLT 413* 349   Basic Metabolic Panel: Recent Labs  Lab 10/07/19 0846 10/08/19 0306  NA 135 136  K 3.5 4.5  CL 101 102  CO2 19* 25  GLUCOSE 455* 309*  BUN 33* 27*  CREATININE 1.12* 0.83  CALCIUM 9.6 9.3   GFR: Estimated  Creatinine Clearance: 68.8 mL/min (by C-G formula based on SCr of 0.83 mg/dL). Liver Function Tests: No results for input(s): AST, ALT, ALKPHOS, BILITOT, PROT, ALBUMIN in the last 168 hours. No results for input(s): LIPASE, AMYLASE in the last 168 hours. No results for input(s): AMMONIA in the last 168 hours. Coagulation Profile: No results for input(s): INR, PROTIME in the last 168 hours. Cardiac Enzymes: No results for input(s): CKTOTAL, CKMB, CKMBINDEX, TROPONINI in the last 168 hours. BNP (last 3 results) No results for input(s): PROBNP in the last 8760 hours. HbA1C: Recent Labs    10/07/19 1412  HGBA1C 8.8*   CBG: Recent Labs  Lab 10/07/19 1631 10/07/19 1937 10/08/19 0740 10/08/19 1050 10/08/19 1336  GLUCAP 260* 139* 279* 255* 280*   Lipid Profile: Recent Labs    10/07/19 1412  CHOL 299*  HDL 50  LDLCALC UNABLE TO CALCULATE IF TRIGLYCERIDE OVER 400 mg/dL  TRIG 672*  CHOLHDL 6.0  LDLDIRECT 158.8*   Thyroid Function Tests: Recent Labs    10/07/19 1412  TSH 0.010*   Anemia Panel: Recent Labs    10/07/19 1412  TIBC 577*  IRON 21*   Sepsis Labs: No results for input(s): PROCALCITON, LATICACIDVEN in the last 168 hours.  Recent Results (from the past 240 hour(s))  SARS Coronavirus 2 by RT PCR (hospital order, performed in [facility] hospital lab) Nasopharyngeal Nasopharyngeal Swab     Status: None   Collection Time: 10/07/19 10:41 AM   Specimen: Nasopharyngeal Swab  Result Value Ref Range Status   SARS Coronavirus 2 NEGATIVE NEGATIVE Final    Comment: (NOTE) SARS-CoV-2 target nucleic acids are NOT DETECTED.  The SARS-CoV-2 RNA is generally detectable in upper and lower respiratory specimens during the acute phase of infection. The lowest concentration of SARS-CoV-2 viral copies this assay can detect is 250 copies / mL. A negative result does not preclude SARS-CoV-2 infection and should not be used as the sole basis for treatment or other patient  management decisions.  A negative result may occur with improper specimen collection / handling, submission of specimen other than nasopharyngeal swab, presence of viral mutation(s) within the areas targeted by this assay, and inadequate number of viral copies (<250 copies / mL). A negative result must be combined with clinical observations, patient history, and epidemiological information.  Fact Sheet for Patients:   BoilerBrush.com.cy  Fact Sheet for Healthcare Providers: https://pope.com/  This test is not yet approved or  cleared by the [place] FDA and has been authorized for detection and/or diagnosis of SARS-CoV-2 by FDA under an Emergency Use Authorization (EUA).  This EUA will remain in effect (meaning this test can be used) for the duration of the COVID-19 declaration under Section 564(b)(1) of the Act, 21 U.S.C. section 360bbb-3(b)(1), unless the authorization  is terminated or revoked sooner.  Performed at [facility], [address] [place], [place] [address]          Radiology Studies: DG Chest 2 View  Result Date: 10/07/2019 CLINICAL DATA:  Central chest pain and shortness of breath EXAM: CHEST - 2 VIEW COMPARISON:  July 02, 2011 FINDINGS: The heart size and mediastinal contours are within normal limits. Both lungs are clear. The visualized skeletal structures are unremarkable. IMPRESSION: No active cardiopulmonary disease. Electronically Signed   By: Sherian ReinWei-Chen  Lin M.D.   On: 10/07/2019 09:37   CT ANGIO CHEST PE W OR WO CONTRAST  Result Date: 10/07/2019 CLINICAL DATA:  Shortness of breath.  Assess for pulmonary embolus. EXAM: CT ANGIOGRAPHY CHEST WITH CONTRAST TECHNIQUE: Multidetector CT imaging of the chest was performed using the standard protocol during bolus administration of intravenous contrast. Multiplanar CT image reconstructions and MIPs were obtained to evaluate the vascular anatomy. CONTRAST:   65mL OMNIPAQUE IOHEXOL 350 MG/ML SOLN COMPARISON:  Chest x-ray October 07, 2019 FINDINGS: Cardiovascular: Satisfactory opacification of the pulmonary arteries to the segmental level. No evidence of pulmonary embolism. The heart size is mildly enlarged. No pericardial effusion. Mediastinum/Nodes: No enlarged mediastinal, hilar, or axillary lymph nodes. Thyroid gland, trachea, and esophagus demonstrate no significant findings. Lungs/Pleura: Minimal atelectasis of posterior lung bases are noted. There is no focal pneumonia or pleural effusion. Upper Abdomen: No acute abnormality. Musculoskeletal: Minimal degenerative joint changes of thoracic spine are noted. Review of the MIP images confirms the above findings. IMPRESSION: 1. No pulmonary embolus. 2. Minimal atelectasis of posterior lung bases. Electronically Signed   By: Sherian ReinWei-Chen  Lin M.D.   On: 10/07/2019 12:31   ECHOCARDIOGRAM COMPLETE  Result Date: 10/07/2019    ECHOCARDIOGRAM REPORT   Patient Name:   Janifer AdieGAIL C Vassell Date of Exam: 10/07/2019 Medical Rec #:  [id number]        Height:       65.0 in Accession #:    [id number]       Weight:       175.0 lb Date of Birth:  05/18/1955         BSA:          1.869 m Patient Age:    64 years         BP:           135/75 mmHg Patient Gender: F                HR:           99 bpm. Exam Location:  Inpatient Procedure: 2D Echo, Cardiac Doppler and Color Doppler Indications:    Chest pain  History:        Patient has no prior history of Echocardiogram examinations.                 Signs/Symptoms:Chest Pain; Risk Factors:Diabetes, Hypertension                 and Dyslipidemia. Pul.emboli.  Sonographer:    Lavenia AtlasBrooke Strickland Referring Phys: [id number] PING T ZHANG IMPRESSIONS  1. Left ventricular ejection fraction, by estimation, is 65 to 70%. The left ventricle has normal function. The left ventricle has no regional wall motion abnormalities. Left ventricular diastolic parameters are indeterminate.  2. Right ventricular systolic function  is normal. The right ventricular size is normal. There is normal pulmonary artery systolic pressure. The estimated right ventricular systolic pressure is 24.5 mmHg.  3. The mitral valve is grossly  normal. Trivial mitral valve regurgitation.  4. The aortic valve is tricuspid. Aortic valve regurgitation is not visualized.  5. The inferior vena cava is normal in size with greater than 50% respiratory variability, suggesting right atrial pressure of 3 mmHg. FINDINGS  Left Ventricle: Left ventricular ejection fraction, by estimation, is 65 to 70%. The left ventricle has normal function. The left ventricle has no regional wall motion abnormalities. The left ventricular internal cavity size was normal in size. There is  borderline left ventricular hypertrophy. Left ventricular diastolic parameters are indeterminate. Right Ventricle: The right ventricular size is normal. No increase in right ventricular wall thickness. Right ventricular systolic function is normal. There is normal pulmonary artery systolic pressure. The tricuspid regurgitant velocity is 2.32 m/s, and  with an assumed right atrial pressure of 3 mmHg, the estimated right ventricular systolic pressure is 24.5 mmHg. Left Atrium: Left atrial size was normal in size. Right Atrium: Right atrial size was normal in size. Pericardium: There is no evidence of pericardial effusion. Mitral Valve: The mitral valve is grossly normal. Trivial mitral valve regurgitation. Tricuspid Valve: The tricuspid valve is grossly normal. Tricuspid valve regurgitation is trivial. Aortic Valve: The aortic valve is tricuspid. Aortic valve regurgitation is not visualized. Mild aortic valve annular calcification. Pulmonic Valve: The pulmonic valve was grossly normal. Pulmonic valve regurgitation is trivial. Aorta: The aortic root is normal in size and structure. Venous: The inferior vena cava is normal in size with greater than 50% respiratory variability, suggesting right atrial pressure  of 3 mmHg. IAS/Shunts: No atrial level shunt detected by color flow Doppler.  LEFT VENTRICLE PLAX 2D LVIDd:         4.20 cm  Diastology LVIDs:         2.40 cm  LV e' lateral:   9.68 cm/s LV PW:         1.00 cm  LV E/e' lateral: 6.2 LV IVS:        1.00 cm  LV e' medial:    5.98 cm/s LVOT diam:     1.80 cm  LV E/e' medial:  10.1 LV SV:         58 LV SV Index:   31 LVOT Area:     2.54 cm  RIGHT VENTRICLE RV Basal diam:  2.80 cm RV S prime:     12.30 cm/s TAPSE (M-mode): 2.3 cm LEFT ATRIUM             Index       RIGHT ATRIUM          Index LA diam:        3.70 cm 1.98 cm/m  RA Area:     9.66 cm LA Vol (A2C):   38.2 ml 20.44 ml/m RA Volume:   18.50 ml 9.90 ml/m LA Vol (A4C):   34.8 ml 18.62 ml/m LA Biplane Vol: 36.7 ml 19.64 ml/m  AORTIC VALVE LVOT Vmax:   98.30 cm/s LVOT Vmean:  67.300 cm/s LVOT VTI:    0.229 m  AORTA Ao Root diam: 2.90 cm MITRAL VALVE                TRICUSPID VALVE MV Area (PHT): 3.99 cm     TR Peak grad:   21.5 mmHg MV Decel Time: 190 msec     TR Vmax:        232.00 cm/s MV E velocity: 60.20 cm/s MV A velocity: 102.00 cm/s  SHUNTS MV E/A ratio:  0.59  Systemic VTI:  0.23 m                             Systemic Diam: 1.80 cm Nona Dell MD Electronically signed by Nona Dell MD Signature Date/Time: 10/07/2019/12:50:46 PM    Final    VAS [place] LOWER EXTREMITY VENOUS (DVT)  Result Date: 10/07/2019  Lower Venous DVTStudy Indications: Pain.  Comparison Study: Prior negative left lower extremity venous duplex from                   02/04/12 is available for comparison. Performing Technologist: Sherren Kerns RVS  Examination Guidelines: A complete evaluation includes B-mode imaging, spectral Doppler, color Doppler, and power Doppler as needed of all accessible portions of each vessel. Bilateral testing is considered an integral part of a complete examination. Limited examinations for reoccurring indications may be performed as noted. The reflux portion of the exam is performed with the  patient in reverse Trendelenburg.  +---------+---------------+---------+-----------+----------+--------------+ RIGHT    CompressibilityPhasicitySpontaneityPropertiesThrombus Aging +---------+---------------+---------+-----------+----------+--------------+ CFV      Full           Yes      Yes                                 +---------+---------------+---------+-----------+----------+--------------+ SFJ      Full                                                        +---------+---------------+---------+-----------+----------+--------------+ FV Prox  Full                                                        +---------+---------------+---------+-----------+----------+--------------+ FV Mid   Full                                                        +---------+---------------+---------+-----------+----------+--------------+ FV DistalFull                                                        +---------+---------------+---------+-----------+----------+--------------+ PFV      Full                                                        +---------+---------------+---------+-----------+----------+--------------+ POP      Full           Yes      Yes                                 +---------+---------------+---------+-----------+----------+--------------+  PTV      Full                                                        +---------+---------------+---------+-----------+----------+--------------+ PERO     Full                                                        +---------+---------------+---------+-----------+----------+--------------+   +---------+---------------+---------+-----------+----------+--------------+ LEFT     CompressibilityPhasicitySpontaneityPropertiesThrombus Aging +---------+---------------+---------+-----------+----------+--------------+ CFV      Full           Yes      Yes                                  +---------+---------------+---------+-----------+----------+--------------+ SFJ      Full                                                        +---------+---------------+---------+-----------+----------+--------------+ FV Prox  Full                                                        +---------+---------------+---------+-----------+----------+--------------+ FV Mid   Full                                                        +---------+---------------+---------+-----------+----------+--------------+ FV DistalFull                                                        +---------+---------------+---------+-----------+----------+--------------+ PFV      Full                                                        +---------+---------------+---------+-----------+----------+--------------+ POP      Full           Yes      Yes                                 +---------+---------------+---------+-----------+----------+--------------+ PTV      Full                                                        +---------+---------------+---------+-----------+----------+--------------+  PERO     Full                                                        +---------+---------------+---------+-----------+----------+--------------+     Summary: BILATERAL: - No evidence of deep vein thrombosis seen in the lower extremities, bilaterally. - RIGHT: - No cystic structure found in the popliteal fossa.  LEFT: - No cystic structure found in the popliteal fossa.  *See table(s) above for measurements and observations. Electronically signed by Sherald Hess MD on 10/07/2019 at 3:17:09 PM.    Final         Scheduled Meds: . aspirin  81 mg Oral Daily  . atorvastatin  40 mg Oral Daily  . canagliflozin  300 mg Oral Daily  . gabapentin  200 mg Oral BID  . insulin aspart  0-15 Units Subcutaneous TID WC  . insulin aspart  0-5 Units Subcutaneous QHS  . [START ON 10/09/2019] insulin  glargine  10 Units Subcutaneous Daily  . irbesartan  150 mg Oral Daily  . metoprolol tartrate  12.5 mg Oral BID  . nicotine  14 mg Transdermal Daily  . pantoprazole  40 mg Oral Daily   Continuous Infusions:   LOS: 0 days    Time spent: 35 miutes    Deazia Lampi A Demauri Advincula, MD [facility]   If 7PM-7AM, please contact night-coverage [email or web address]  10/08/2019, 3:10 PM

## 2019-10-08 NOTE — H&P (View-Only) (Signed)
[facility] Gastroenterology Consult  Referring Provider: [facility]/Dr.Regalado Primary Care Physician:  Maurice Small, MD Primary Gastroenterologist: Dr.Schooler  Reason for Consultation:  Symptomatic anemia  HPI: Chelsea Gray is a 64 y.o. female was admitted on 10/07/2019 with shortness of breath and chest pain. Patient had exertional shortness of breath which prompted her to come to the ER. She has been feeling weak for some time. She has also had about 30 pound weight loss in the last 6 months. She denies nausea, vomiting, difficulty swallowing or pain on swallowing but has been experiencing acid reflux, heartburn and early satiety. At home her bowel movements are regular, usually 1/day and she has not noted any blood in stool or black stools. Her last colonoscopy was performed in 2012, reported as normal with recommendation to repeat in 10 years. Patient has been taking Aleve intermittently as needed.  She is being prepared for cardiac catheterization on Tuesday, however due to symptomatic anemia and possible need for antiplatelet therapy if PCI is performed GI consult was requested for endoscopy and colonoscopy.  There is no family history of colon cancer.  Past Medical History:  Diagnosis Date   Blood transfusion    d/t hemmorrhage post c- section   DEPRESSION 01/24/2009   DM 01/24/2009   H/O blood transfusion reaction 09/1992   cause PE and hemolytic type reaction per pt   History of one miscarriage    HYPERCHOLESTEROLEMIA 01/24/2009   HYPERTENSION 01/24/2009   Osteoarthritis    Pulmonary embolism (HCC)    result of blood transfusion reaction 18 years ago after childbirth    Past Surgical History:  Procedure Laterality Date   BREAST CYST EXCISION     BREAST REDUCTION SURGERY     CERVICAL CERCLAGE     CESAREAN SECTION     JOINT REPLACEMENT     KNEE ARTHROPLASTY  07/14/2011   Procedure: COMPUTER ASSISTED TOTAL KNEE ARTHROPLASTY;  Surgeon: Cammy Copa, MD;  Location: [facility] OR;  Service: Orthopedics;  Laterality: Left;  Left total knee arthroplasty   REDUCTION MAMMAPLASTY Bilateral     Prior to Admission medications   Medication Sig Start Date End Date Taking? Authorizing Provider  atorvastatin (LIPITOR) 40 MG tablet Take 40 mg by mouth daily. 07/13/19  Yes [provider]  FREESTYLE LITE test strip CHECK BLOOD SUGAR ONCE A DAY Patient taking differently: 1 each by Other route daily.  08/27/15  Yes Romero Belling, MD  hydrochlorothiazide (HYDRODIURIL) 12.5 MG tablet Take 12.5 mg by mouth every morning. 08/03/19  Yes [provider]  INVOKANA 300 MG TABS tablet TAKE 1 TABLET BY MOUTH EVERY DAY Patient taking differently: Take 300 mg by mouth daily before breakfast.  04/18/18  Yes Romero Belling, MD  metFORMIN (GLUCOPHAGE) 500 MG tablet Take 1 tablet (500 mg total) by mouth 2 (two) times daily with a meal. Patient taking differently: Take 500 mg by mouth daily with breakfast.  12/15/16  Yes Romero Belling, MD  NOVOTWIST 32G X 5 MM MISC USE DAILY FOR INJECTION Patient taking differently: 1 each by Other route daily.  05/21/15  Yes Romero Belling, MD  olmesartan (BENICAR) 20 MG tablet Take 20 mg by mouth daily.   Yes [provider]  TRULICITY 1.5 MG/0.5ML SOPN INJECT 1.5 MG INTO THE SKIN ONCE A WEEK. 09/21/16  Yes Romero Belling, MD  meloxicam (MOBIC) 15 MG tablet TAKE 1 TABLET BY MOUTH EVERY DAY Patient not taking: Reported on 10/07/2019 02/21/18   Cammy Copa, MD  pioglitazone (  ACTOS) 45 MG tablet TAKE 1 TABLET (45 MG TOTAL) BY MOUTH DAILY. Patient not taking: Reported on 10/07/2019 01/05/17   Romero Belling, MD    Current Facility-Administered Medications  Medication Dose Route Frequency Provider Last Rate Last Admin   acetaminophen (TYLENOL) tablet 650 mg  650 mg Oral Q6H PRN Mikey College T, MD   650 mg at 10/07/19 2028   Or   acetaminophen (TYLENOL) suppository 650 mg  650 mg Rectal Q6H PRN Emeline General, MD        aspirin chewable tablet 81 mg  81 mg Oral Daily Lewayne Bunting, MD   81 mg at 10/08/19 1128   atorvastatin (LIPITOR) tablet 40 mg  40 mg Oral Daily Mikey College T, MD   40 mg at 10/08/19 1128   bisacodyl (DULCOLAX) EC tablet 10 mg  10 mg Oral Once Kerin Salen, MD       gabapentin (NEURONTIN) capsule 200 mg  200 mg Oral BID Regalado, Belkys A, MD       insulin aspart (novoLOG) injection 0-15 Units  0-15 Units Subcutaneous TID WC Mikey College T, MD   8 Units at 10/08/19 1337   insulin aspart (novoLOG) injection 0-5 Units  0-5 Units Subcutaneous QHS Mikey College T, MD       irbesartan (AVAPRO) tablet 150 mg  150 mg Oral Daily Mikey College T, MD   150 mg at 10/08/19 1128   metoprolol tartrate (LOPRESSOR) tablet 12.5 mg  12.5 mg Oral BID Lewayne Bunting, MD   12.5 mg at 10/08/19 1127   nicotine (NICODERM CQ - dosed in mg/24 hours) patch 14 mg  14 mg Transdermal Daily Mikey College T, MD   14 mg at 10/08/19 1128   ondansetron (ZOFRAN) injection 4 mg  4 mg Intravenous Q6H PRN Mikey College T, MD   4 mg at 10/07/19 1701   pantoprazole (PROTONIX) EC tablet 40 mg  40 mg Oral Daily Lewayne Bunting, MD   40 mg at 10/08/19 1225   polyethylene glycol-electrolytes (NuLYTELY) solution 4,000 mL  4,000 mL Oral Once Kerin Salen, MD       traMADol Janean Sark) tablet 50 mg  50 mg Oral Q12H PRN Regalado, Belkys A, MD   50 mg at 10/08/19 1139    Allergies as of 10/07/2019 - Review Complete 10/07/2019  Allergen Reaction Noted   Codeine sulfate Nausea And Vomiting and Other (See Comments) 01/24/2009   Metformin Diarrhea and Other (See Comments) 01/24/2009    Family History  Problem Relation Age of Onset   Diabetes Mother    Hypertension Mother    Diabetes Father    Arrhythmia Father    Heart disease Father        CABG   Breast cancer Sister    Diabetes Sister    Anesthesia problems Neg Hx     Social History   Socioeconomic History   Marital status: Married    Spouse name: Not  on file   Number of children: Not on file   Years of education: Not on file   Highest education level: Not on file  Occupational History   Occupation: Neurosurgeon: [employer]  Tobacco Use   Smoking status: Current Every Day Smoker    Packs/day: 0.10    Years: 6.00    Pack years: 0.60   Smokeless tobacco: Never Used  Substance and Sexual Activity   Alcohol use: Yes    Comment: once drink every few  months   Drug use: No   Sexual activity: Not on file  Other Topics Concern   Not on file  Social History Narrative   Not on file   Social Determinants of Health   Financial Resource Strain:    Difficulty of Paying Living Expenses:   Food Insecurity:    Worried About Running Out of Food in the Last Year:    Baristaan Out of Food in the Last Year:   Transportation Needs:    Freight forwarderLack of Transportation (Medical):    Lack of Transportation (Non-Medical):   Physical Activity:    Days of Exercise per Week:    Minutes of Exercise per Session:   Stress:    Feeling of Stress :   Social Connections:    Frequency of Communication with Friends and Family:    Frequency of Social Gatherings with Friends and Family:    Attends Religious Services:    Active Member of Clubs or Organizations:    Attends Engineer, structuralClub or Organization Meetings:    Marital Status:   Intimate Partner Violence:    Fear of Current or Ex-Partner:    Emotionally Abused:    Physically Abused:    Sexually Abused:     Review of Systems: Positive for: GI: Described in detail in HPI.    Gen:  involuntary weight loss, denies any fever, chills, rigors, night sweats, anorexia, fatigue, weakness, malaise,and sleep disorder CV: chest pain, angina, denies , palpitations, syncope, orthopnea, PND, peripheral edema, and claudication. Resp: dyspnea,  denies cough, sputum, wheezing, coughing up blood. GU : Denies urinary burning, blood in urine, urinary frequency, urinary hesitancy, nocturnal urination,  and urinary incontinence. MS: Denies joint pain or swelling.  Denies muscle weakness, cramps, atrophy.  Derm: Denies rash, itching, oral ulcerations, hives, unhealing ulcers.  Psych: Denies depression, anxiety, memory loss, suicidal ideation, hallucinations,  and confusion. Heme: Denies bruising, bleeding, and enlarged lymph nodes. Neuro:  Denies any headaches, dizziness, paresthesias. Endo:  DM,Denies any problems with  thyroid, adrenal function.  Physical Exam: Vital signs in last 24 hours: Temp:  [98 F (36.7 C)-98.9 F (37.2 C)] 98.3 F (36.8 C) (07/04 0328) Pulse Rate:  [66-93] 66 (07/04 0328) Resp:  [16-18] 18 (07/04 0328) BP: (117-158)/(54-76) 121/63 (07/04 0328) SpO2:  [98 %-100 %] 98 % (07/04 0328) Weight:  [75.7 kg-75.8 kg] 75.8 kg (07/04 0328) Last BM Date: 10/06/19  General:   Alert,  Well-developed, well-nourished, pleasant and cooperative in NAD Head:  Normocephalic and atraumatic. Eyes: Prominent pallor Ears:  Normal auditory acuity. Nose:  No deformity, discharge,  or lesions. Mouth:  No deformity or lesions.  Oropharynx pink & moist. Neck:  Supple; no masses or thyromegaly. Lungs:  Clear throughout to auscultation.   No wheezes, crackles, or rhonchi. No acute distress. Heart:  Regular rate and rhythm; no murmurs, clicks, rubs,  or gallops. Extremities: Scar from right knee replacement, no edema Neurologic:  Alert and  oriented x4;  grossly normal neurologically. Skin:  Intact without significant lesions or rashes. Psych:  Alert and cooperative. Normal mood and affect. Abdomen:  Soft, nontender and nondistended. No masses, hepatosplenomegaly or hernias noted. Normal bowel sounds, without guarding, and without rebound.         Lab Results: Recent Labs    10/07/19 0846 10/08/19 0306  WBC 7.3 7.5  HGB 9.8* 8.6*  HCT 33.2* 28.9*  PLT 413* 349   BMET Recent Labs    10/07/19 0846 10/08/19 0306  NA 135 136  K 3.5  4.5  CL 101 102  CO2 19* 25  GLUCOSE  455* 309*  BUN 33* 27*  CREATININE 1.12* 0.83  CALCIUM 9.6 9.3   LFT No results for input(s): PROT, ALBUMIN, AST, ALT, ALKPHOS, BILITOT, BILIDIR, IBILI in the last 72 hours. PT/INR No results for input(s): LABPROT, INR in the last 72 hours.  Studies/Results: DG Chest 2 View  Result Date: 10/07/2019 CLINICAL DATA:  Central chest pain and shortness of breath EXAM: CHEST - 2 VIEW COMPARISON:  July 02, 2011 FINDINGS: The heart size and mediastinal contours are within normal limits. Both lungs are clear. The visualized skeletal structures are unremarkable. IMPRESSION: No active cardiopulmonary disease. Electronically Signed   By: Sherian Rein M.D.   On: 10/07/2019 09:37   CT ANGIO CHEST PE W OR WO CONTRAST  Result Date: 10/07/2019 CLINICAL DATA:  Shortness of breath.  Assess for pulmonary embolus. EXAM: CT ANGIOGRAPHY CHEST WITH CONTRAST TECHNIQUE: Multidetector CT imaging of the chest was performed using the standard protocol during bolus administration of intravenous contrast. Multiplanar CT image reconstructions and MIPs were obtained to evaluate the vascular anatomy. CONTRAST:  21mL OMNIPAQUE IOHEXOL 350 MG/ML SOLN COMPARISON:  Chest x-ray October 07, 2019 FINDINGS: Cardiovascular: Satisfactory opacification of the pulmonary arteries to the segmental level. No evidence of pulmonary embolism. The heart size is mildly enlarged. No pericardial effusion. Mediastinum/Nodes: No enlarged mediastinal, hilar, or axillary lymph nodes. Thyroid gland, trachea, and esophagus demonstrate no significant findings. Lungs/Pleura: Minimal atelectasis of posterior lung bases are noted. There is no focal pneumonia or pleural effusion. Upper Abdomen: No acute abnormality. Musculoskeletal: Minimal degenerative joint changes of thoracic spine are noted. Review of the MIP images confirms the above findings. IMPRESSION: 1. No pulmonary embolus. 2. Minimal atelectasis of posterior lung bases. Electronically Signed   By: Sherian Rein M.D.   On: 10/07/2019 12:31   ECHOCARDIOGRAM COMPLETE  Result Date: 10/07/2019    ECHOCARDIOGRAM REPORT   Patient Name:   Chelsea Gray Date of Exam: 10/07/2019 Medical Rec #:  [id number]        Height:       65.0 in Accession #:    [id number]       Weight:       175.0 lb Date of Birth:  [birth year]         BSA:          1.869 m Patient Age:    64 years         BP:           135/75 mmHg Patient Gender: F                HR:           99 bpm. Exam Location:  Inpatient Procedure: 2D Echo, Cardiac Doppler and Color Doppler Indications:    Chest pain  History:        Patient has no prior history of Echocardiogram examinations.                 Signs/Symptoms:Chest Pain; Risk Factors:Diabetes, Hypertension                 and Dyslipidemia. Pul.emboli.  Sonographer:    Lavenia Atlas Referring Phys: [id number] PING T ZHANG IMPRESSIONS  1. Left ventricular ejection fraction, by estimation, is 65 to 70%. The left ventricle has normal function. The left ventricle has no regional wall motion abnormalities. Left ventricular diastolic parameters are indeterminate.  2. Right ventricular systolic function is normal.  The right ventricular size is normal. There is normal pulmonary artery systolic pressure. The estimated right ventricular systolic pressure is 24.5 mmHg.  3. The mitral valve is grossly normal. Trivial mitral valve regurgitation.  4. The aortic valve is tricuspid. Aortic valve regurgitation is not visualized.  5. The inferior vena cava is normal in size with greater than 50% respiratory variability, suggesting right atrial pressure of 3 mmHg. FINDINGS  Left Ventricle: Left ventricular ejection fraction, by estimation, is 65 to 70%. The left ventricle has normal function. The left ventricle has no regional wall motion abnormalities. The left ventricular internal cavity size was normal in size. There is  borderline left ventricular hypertrophy. Left ventricular diastolic parameters are indeterminate. Right Ventricle:  The right ventricular size is normal. No increase in right ventricular wall thickness. Right ventricular systolic function is normal. There is normal pulmonary artery systolic pressure. The tricuspid regurgitant velocity is 2.32 m/s, and  with an assumed right atrial pressure of 3 mmHg, the estimated right ventricular systolic pressure is 24.5 mmHg. Left Atrium: Left atrial size was normal in size. Right Atrium: Right atrial size was normal in size. Pericardium: There is no evidence of pericardial effusion. Mitral Valve: The mitral valve is grossly normal. Trivial mitral valve regurgitation. Tricuspid Valve: The tricuspid valve is grossly normal. Tricuspid valve regurgitation is trivial. Aortic Valve: The aortic valve is tricuspid. Aortic valve regurgitation is not visualized. Mild aortic valve annular calcification. Pulmonic Valve: The pulmonic valve was grossly normal. Pulmonic valve regurgitation is trivial. Aorta: The aortic root is normal in size and structure. Venous: The inferior vena cava is normal in size with greater than 50% respiratory variability, suggesting right atrial pressure of 3 mmHg. IAS/Shunts: No atrial level shunt detected by color flow Doppler.  LEFT VENTRICLE PLAX 2D LVIDd:         4.20 cm  Diastology LVIDs:         2.40 cm  LV e' lateral:   9.68 cm/s LV PW:         1.00 cm  LV E/e' lateral: 6.2 LV IVS:        1.00 cm  LV e' medial:    5.98 cm/s LVOT diam:     1.80 cm  LV E/e' medial:  10.1 LV SV:         58 LV SV Index:   31 LVOT Area:     2.54 cm  RIGHT VENTRICLE RV Basal diam:  2.80 cm RV S prime:     12.30 cm/s TAPSE (M-mode): 2.3 cm LEFT ATRIUM             Index       RIGHT ATRIUM          Index LA diam:        3.70 cm 1.98 cm/m  RA Area:     9.66 cm LA Vol (A2C):   38.2 ml 20.44 ml/m RA Volume:   18.50 ml 9.90 ml/m LA Vol (A4C):   34.8 ml 18.62 ml/m LA Biplane Vol: 36.7 ml 19.64 ml/m  AORTIC VALVE LVOT Vmax:   98.30 cm/s LVOT Vmean:  67.300 cm/s LVOT VTI:    0.229 m  AORTA Ao  Root diam: 2.90 cm MITRAL VALVE                TRICUSPID VALVE MV Area (PHT): 3.99 cm     TR Peak grad:   21.5 mmHg MV Decel Time: 190 msec     TR Vmax:  232.00 cm/s MV E velocity: 60.20 cm/s MV A velocity: 102.00 cm/s  SHUNTS MV E/A ratio:  0.59         Systemic VTI:  0.23 m                             Systemic Diam: 1.80 cm Nona Dell MD Electronically signed by Nona Dell MD Signature Date/Time: 10/07/2019/12:50:46 PM    Final    VAS [place] LOWER EXTREMITY VENOUS (DVT)  Result Date: 10/07/2019  Lower Venous DVTStudy Indications: Pain.  Comparison Study: Prior negative left lower extremity venous duplex from                   02/04/12 is available for comparison. Performing Technologist: Sherren Kerns RVS  Examination Guidelines: A complete evaluation includes B-mode imaging, spectral Doppler, color Doppler, and power Doppler as needed of all accessible portions of each vessel. Bilateral testing is considered an integral part of a complete examination. Limited examinations for reoccurring indications may be performed as noted. The reflux portion of the exam is performed with the patient in reverse Trendelenburg.  +---------+---------------+---------+-----------+----------+--------------+  RIGHT     Compressibility Phasicity Spontaneity Properties Thrombus Aging  +---------+---------------+---------+-----------+----------+--------------+  CFV       Full            Yes       Yes                                    +---------+---------------+---------+-----------+----------+--------------+  SFJ       Full                                                             +---------+---------------+---------+-----------+----------+--------------+  FV Prox   Full                                                             +---------+---------------+---------+-----------+----------+--------------+  FV Mid    Full                                                              +---------+---------------+---------+-----------+----------+--------------+  FV Distal Full                                                             +---------+---------------+---------+-----------+----------+--------------+  PFV       Full                                                             +---------+---------------+---------+-----------+----------+--------------+  POP       Full            Yes       Yes                                    +---------+---------------+---------+-----------+----------+--------------+  PTV       Full                                                             +---------+---------------+---------+-----------+----------+--------------+  PERO      Full                                                             +---------+---------------+---------+-----------+----------+--------------+   +---------+---------------+---------+-----------+----------+--------------+  LEFT      Compressibility Phasicity Spontaneity Properties Thrombus Aging  +---------+---------------+---------+-----------+----------+--------------+  CFV       Full            Yes       Yes                                    +---------+---------------+---------+-----------+----------+--------------+  SFJ       Full                                                             +---------+---------------+---------+-----------+----------+--------------+  FV Prox   Full                                                             +---------+---------------+---------+-----------+----------+--------------+  FV Mid    Full                                                             +---------+---------------+---------+-----------+----------+--------------+  FV Distal Full                                                             +---------+---------------+---------+-----------+----------+--------------+  PFV       Full                                                              +---------+---------------+---------+-----------+----------+--------------+  POP       Full            Yes       Yes                                    +---------+---------------+---------+-----------+----------+--------------+  PTV       Full                                                             +---------+---------------+---------+-----------+----------+--------------+  PERO      Full                                                             +---------+---------------+---------+-----------+----------+--------------+     Summary: BILATERAL: - No evidence of deep vein thrombosis seen in the lower extremities, bilaterally. - RIGHT: - No cystic structure found in the popliteal fossa.  LEFT: - No cystic structure found in the popliteal fossa.  *See table(s) above for measurements and observations. Electronically signed by Sherald Hess MD on 10/07/2019 at 3:17:09 PM.    Final     Impression: Symptomatic anemia, hemoglobin 9.8/8.6 with MCV 76.5 Elevated BUN/creatinine ratio 33/1.12 yesterday and 27/0.83 with history of NSAID use Iron saturation 4% with elevated TIBC compatible with iron deficiency Unintentional weight loss and  early satiety  Plan: EGD and colonoscopy tomorrow. Patient had a full breakfast of omelette and sausage, onions in the morning and a Caesar salad for lunch. Hopefully the colonic prep given today will be enough to visualize colonic mucosa tomorrow. We will keep patient on clear liquid diet and n.p.o. post midnight with orders written for colonic prep. The risks and the benefits of the procedure were discussed with the patient in details.  She understands and verbalizes consent.   LOS: 0 days   Kerin Salen, MD  10/08/2019, 2:25 PM

## 2019-10-08 NOTE — Progress Notes (Addendum)
ANTICOAGULATION CONSULT NOTE  Pharmacy Consult for Heparin Indication: chest pain/ACS  Allergies  Allergen Reactions  . Codeine Sulfate Nausea And Vomiting and Other (See Comments)    dizziness  . Metformin Diarrhea and Other (See Comments)    fatigue    Patient Measurements: Height: 5\' 5"  (165.1 cm) Weight: 75.8 kg (167 lb 1.6 oz) IBW/kg (Calculated) : 57 Heparin Dosing Weight: 73.7 kg  Vital Signs: Temp: 98.3 F (36.8 C) (07/04 0328) Temp Source: Oral (07/04 0328) BP: 121/63 (07/04 0328) Pulse Rate: 66 (07/04 0328)  Labs: Recent Labs    10/07/19 0846 10/07/19 1027 10/07/19 2047 10/08/19 0306  HGB 9.8*  --   --  8.6*  HCT 33.2*  --   --  28.9*  PLT 413*  --   --  349  HEPARINUNFRC  --   --  0.65 0.47  CREATININE 1.12*  --   --  0.83  TROPONINIHS 13 19* 21*  --     Estimated Creatinine Clearance: 68.8 mL/min (by C-G formula based on SCr of 0.83 mg/dL).   Assessment: 64 yo women admitted 10/07/2019 for chest pain. Patient has a history of DVT & PE during pregnancy however is not on anticoagulation at this time. Pharmacy consulted to manage heparin for unstable angina. Troponin 13 > 21.   Heparin level of 0.47 is therapeutic on heparin 850 units/hr. Hgb 8.6. Plt wnl. No reported bleeding.    Goal of Therapy:  Heparin level 0.3-0.7 units/ml Monitor platelets by anticoagulation protocol: Yes   Plan:  Continue heparin 850 units/hr  Check confirmatory heparin level at 1000 Monitor heparin level, CBC and s/s of bleeding daily  Follow plans for cardiac cath per cardiology  12/08/2019, PharmD  Addendum: heparin discontinued per Dr. Gerrit Halls due to hemoglobin. Follow up hemoglobin, GI recommendations, and plans for cardiac cath tentatively planned for Tuesday.

## 2019-10-08 NOTE — Progress Notes (Signed)
Progress Note  Patient Name: Chelsea Gray Date of Encounter: 10/08/2019  [facility] Cardi[facility]: Dr Jens Somrenshaw  Subjective   No CP or dyspnea; complains of pain behind knees bilaterally  Inpatient Medications    Scheduled Meds:  aspirin  81 mg Oral Daily   atorvastatin  40 mg Oral Daily   insulin aspart  0-15 Units Subcutaneous TID WC   insulin aspart  0-5 Units Subcutaneous QHS   irbesartan  150 mg Oral Daily   metoprolol tartrate  12.5 mg Oral BID   nicotine  14 mg Transdermal Daily   Continuous Infusions:  heparin 850 Units/hr (10/07/19 2117)   PRN Meds: acetaminophen **OR** acetaminophen, ondansetron (ZOFRAN) IV   Vital Signs    Vitals:   10/07/19 1940 10/07/19 2223 10/08/19 0029 10/08/19 0328  BP: 125/66 133/60 (!) 117/54 121/63  Pulse: 76 70 69 66  Resp: 18  16 18   Temp: 98 F (36.7 C)  98.2 F (36.8 C) 98.3 F (36.8 C)  TempSrc: Oral  Oral Oral  SpO2: 98%  100% 98%  Weight:    75.8 kg  Height:        Intake/Output Summary (Last 24 hours) at 10/08/2019 16100722 Last data filed at 10/07/2019 1930 Gross per 24 hour  Intake 636.58 ml  Output --  Net 636.58 ml   Last 3 Weights 10/08/2019 10/07/2019 10/07/2019  Weight (lbs) 167 lb 1.6 oz 166 lb 14.4 oz 175 lb  Weight (kg) 75.796 kg 75.705 kg 79.379 kg      Telemetry    Sinus- Personally Reviewed  Physical Exam   GEN: No acute distress.   Neck: No JVD Cardiac: RRR, no murmurs, rubs, or gallops.  Respiratory: Clear to auscultation bilaterally. GI: Soft, nontender, non-distended  MS: No edema Neuro:  Nonfocal  Psych: Normal affect   Labs    High Sensitivity Troponin:   Recent Labs  Lab 10/07/19 0846 10/07/19 1027 10/07/19 2047  TROPONINIHS 13 19* 21*      Chemistry Recent Labs  Lab 10/07/19 0846 10/08/19 0306  NA 135 136  K 3.5 4.5  CL 101 102  CO2 19* 25  GLUCOSE 455* 309*  BUN 33* 27*  CREATININE 1.12* 0.83  CALCIUM 9.6 9.3  GFRNONAA 52* >60  GFRAA 60* >60  ANIONGAP  15 9     Hematology Recent Labs  Lab 10/07/19 0846 10/08/19 0306  WBC 7.3 7.5  RBC 4.28 3.78*  HGB 9.8* 8.6*  HCT 33.2* 28.9*  MCV 77.6* 76.5*  MCH 22.9* 22.8*  MCHC 29.5* 29.8*  RDW 17.2* 17.0*  PLT 413* 349    DDimer  Recent Labs  Lab 10/07/19 0945  DDIMER 0.51*     Radiology    DG Chest 2 View  Result Date: 10/07/2019 CLINICAL DATA:  Central chest pain and shortness of breath EXAM: CHEST - 2 VIEW COMPARISON:  July 02, 2011 FINDINGS: The heart size and mediastinal contours are within normal limits. Both lungs are clear. The visualized skeletal structures are unremarkable. IMPRESSION: No active cardiopulmonary disease. Electronically Signed   By: Sherian ReinWei-Chen  Lin M.D.   On: 10/07/2019 09:37   CT ANGIO CHEST PE W OR WO CONTRAST  Result Date: 10/07/2019 CLINICAL DATA:  Shortness of breath.  Assess for pulmonary embolus. EXAM: CT ANGIOGRAPHY CHEST WITH CONTRAST TECHNIQUE: Multidetector CT imaging of the chest was performed using the standard protocol during bolus administration of intravenous contrast. Multiplanar CT image reconstructions and MIPs were obtained to evaluate the vascular anatomy.  CONTRAST:  107mL OMNIPAQUE IOHEXOL 350 MG/ML SOLN COMPARISON:  Chest x-ray October 07, 2019 FINDINGS: Cardiovascular: Satisfactory opacification of the pulmonary arteries to the segmental level. No evidence of pulmonary embolism. The heart size is mildly enlarged. No pericardial effusion. Mediastinum/Nodes: No enlarged mediastinal, hilar, or axillary lymph nodes. Thyroid gland, trachea, and esophagus demonstrate no significant findings. Lungs/Pleura: Minimal atelectasis of posterior lung bases are noted. There is no focal pneumonia or pleural effusion. Upper Abdomen: No acute abnormality. Musculoskeletal: Minimal degenerative joint changes of thoracic spine are noted. Review of the MIP images confirms the above findings. IMPRESSION: 1. No pulmonary embolus. 2. Minimal atelectasis of posterior lung  bases. Electronically Signed   By: Sherian Rein M.D.   On: 10/07/2019 12:31   ECHOCARDIOGRAM COMPLETE  Result Date: 10/07/2019    ECHOCARDIOGRAM REPORT   Patient Name:   Chelsea Gray Date of Exam: 10/07/2019 Medical Rec #:  [id number]        Height:       65.0 in Accession #:    [id number]       Weight:       175.0 lb Date of Birth:  10/01/1955         BSA:          1.869 m Patient Age:    64 years         BP:           135/75 mmHg Patient Gender: F                HR:           99 bpm. Exam Location:  Inpatient Procedure: 2D Echo, Cardiac Doppler and Color Doppler Indications:    Chest pain  History:        Patient has no prior history of Echocardiogram examinations.                 Signs/Symptoms:Chest Pain; Risk Factors:Diabetes, Hypertension                 and Dyslipidemia. Pul.emboli.  Sonographer:    Lavenia Atlas Referring Phys: [id number] PING T ZHANG IMPRESSIONS  1. Left ventricular ejection fraction, by estimation, is 65 to 70%. The left ventricle has normal function. The left ventricle has no regional wall motion abnormalities. Left ventricular diastolic parameters are indeterminate.  2. Right ventricular systolic function is normal. The right ventricular size is normal. There is normal pulmonary artery systolic pressure. The estimated right ventricular systolic pressure is 24.5 mmHg.  3. The mitral valve is grossly normal. Trivial mitral valve regurgitation.  4. The aortic valve is tricuspid. Aortic valve regurgitation is not visualized.  5. The inferior vena cava is normal in size with greater than 50% respiratory variability, suggesting right atrial pressure of 3 mmHg. FINDINGS  Left Ventricle: Left ventricular ejection fraction, by estimation, is 65 to 70%. The left ventricle has normal function. The left ventricle has no regional wall motion abnormalities. The left ventricular internal cavity size was normal in size. There is  borderline left ventricular hypertrophy. Left ventricular diastolic  parameters are indeterminate. Right Ventricle: The right ventricular size is normal. No increase in right ventricular wall thickness. Right ventricular systolic function is normal. There is normal pulmonary artery systolic pressure. The tricuspid regurgitant velocity is 2.32 m/s, and  with an assumed right atrial pressure of 3 mmHg, the estimated right ventricular systolic pressure is 24.5 mmHg. Left Atrium: Left atrial size was normal in size. Right Atrium: Right atrial  size was normal in size. Pericardium: There is no evidence of pericardial effusion. Mitral Valve: The mitral valve is grossly normal. Trivial mitral valve regurgitation. Tricuspid Valve: The tricuspid valve is grossly normal. Tricuspid valve regurgitation is trivial. Aortic Valve: The aortic valve is tricuspid. Aortic valve regurgitation is not visualized. Mild aortic valve annular calcification. Pulmonic Valve: The pulmonic valve was grossly normal. Pulmonic valve regurgitation is trivial. Aorta: The aortic root is normal in size and structure. Venous: The inferior vena cava is normal in size with greater than 50% respiratory variability, suggesting right atrial pressure of 3 mmHg. IAS/Shunts: No atrial level shunt detected by color flow Doppler.  LEFT VENTRICLE PLAX 2D LVIDd:         4.20 cm  Diastology LVIDs:         2.40 cm  LV e' lateral:   9.68 cm/s LV PW:         1.00 cm  LV E/e' lateral: 6.2 LV IVS:        1.00 cm  LV e' medial:    5.98 cm/s LVOT diam:     1.80 cm  LV E/e' medial:  10.1 LV SV:         58 LV SV Index:   31 LVOT Area:     2.54 cm  RIGHT VENTRICLE RV Basal diam:  2.80 cm RV S prime:     12.30 cm/s TAPSE (M-mode): 2.3 cm LEFT ATRIUM             Index       RIGHT ATRIUM          Index LA diam:        3.70 cm 1.98 cm/m  RA Area:     9.66 cm LA Vol (A2C):   38.2 ml 20.44 ml/m RA Volume:   18.50 ml 9.90 ml/m LA Vol (A4C):   34.8 ml 18.62 ml/m LA Biplane Vol: 36.7 ml 19.64 ml/m  AORTIC VALVE LVOT Vmax:   98.30 cm/s LVOT Vmean:   67.300 cm/s LVOT VTI:    0.229 m  AORTA Ao Root diam: 2.90 cm MITRAL VALVE                TRICUSPID VALVE MV Area (PHT): 3.99 cm     TR Peak grad:   21.5 mmHg MV Decel Time: 190 msec     TR Vmax:        232.00 cm/s MV E velocity: 60.20 cm/s MV A velocity: 102.00 cm/s  SHUNTS MV E/A ratio:  0.59         Systemic VTI:  0.23 m                             Systemic Diam: 1.80 cm Nona Dell MD Electronically signed by Nona Dell MD Signature Date/Time: 10/07/2019/12:50:46 PM    Final    VAS [place] LOWER EXTREMITY VENOUS (DVT)  Result Date: 10/07/2019  Lower Venous DVTStudy Indications: Pain.  Comparison Study: Prior negative left lower extremity venous duplex from                   02/04/12 is available for comparison. Performing Technologist: Sherren Kerns RVS  Examination Guidelines: A complete evaluation includes B-mode imaging, spectral Doppler, color Doppler, and power Doppler as needed of all accessible portions of each vessel. Bilateral testing is considered an integral part of a complete examination. Limited examinations for reoccurring indications may be performed as  noted. The reflux portion of the exam is performed with the patient in reverse Trendelenburg.  +---------+---------------+---------+-----------+----------+--------------+  RIGHT     Compressibility Phasicity Spontaneity Properties Thrombus Aging  +---------+---------------+---------+-----------+----------+--------------+  CFV       Full            Yes       Yes                                    +---------+---------------+---------+-----------+----------+--------------+  SFJ       Full                                                             +---------+---------------+---------+-----------+----------+--------------+  FV Prox   Full                                                             +---------+---------------+---------+-----------+----------+--------------+  FV Mid    Full                                                              +---------+---------------+---------+-----------+----------+--------------+  FV Distal Full                                                             +---------+---------------+---------+-----------+----------+--------------+  PFV       Full                                                             +---------+---------------+---------+-----------+----------+--------------+  POP       Full            Yes       Yes                                    +---------+---------------+---------+-----------+----------+--------------+  PTV       Full                                                             +---------+---------------+---------+-----------+----------+--------------+  PERO      Full                                                             +---------+---------------+---------+-----------+----------+--------------+   +---------+---------------+---------+-----------+----------+--------------+  LEFT      Compressibility Phasicity Spontaneity Properties Thrombus Aging  +---------+---------------+---------+-----------+----------+--------------+  CFV       Full            Yes       Yes                                    +---------+---------------+---------+-----------+----------+--------------+  SFJ       Full                                                             +---------+---------------+---------+-----------+----------+--------------+  FV Prox   Full                                                             +---------+---------------+---------+-----------+----------+--------------+  FV Mid    Full                                                             +---------+---------------+---------+-----------+----------+--------------+  FV Distal Full                                                             +---------+---------------+---------+-----------+----------+--------------+  PFV       Full                                                              +---------+---------------+---------+-----------+----------+--------------+  POP       Full            Yes       Yes                                    +---------+---------------+---------+-----------+----------+--------------+  PTV       Full                                                             +---------+---------------+---------+-----------+----------+--------------+  PERO      Full                                                             +---------+---------------+---------+-----------+----------+--------------+  Summary: BILATERAL: - No evidence of deep vein thrombosis seen in the lower extremities, bilaterally. - RIGHT: - No cystic structure found in the popliteal fossa.  LEFT: - No cystic structure found in the popliteal fossa.  *See table(s) above for measurements and observations. Electronically signed by Sherald Hess MD on 10/07/2019 at 3:17:09 PM.    Final     Patient Profile     64 y.o. female with past medical history of diabetes mellitus, hypertension, tobacco abuse, prior pulmonary embolus for evaluation of unstable angina.  Echocardiogram shows hyperdynamic LV function.  Assessment & Plan    1 unstable angina-patient symptoms at time of admission were concerning.  She also has multiple risk factors including greater than 10 years of diabetes mellitus.  Our plan was to proceed with cardiac catheterization.  However she has a microcytic anemia and her hemoglobin has dropped further today.  I will hold IV heparin.  Continue aspirin, beta-blocker and statin.  Ultimately I think she needs cardiac catheterization which is tentatively planned for Tuesday.  However her anemia needs to be evaluated prior to proceeding (she would require dual antiplatelet therapy if PCI performed).  2 microcytic anemia-her most recent hemoglobin was from 2017 and was normal.  Anemia appears to be new.  Her labs also are consistent with iron deficiency.  I will hold IV heparin.  Would recommend GI  evaluation as she has not had a colonoscopy in years by her report.  Follow hemoglobin.  We will add Protonix.  3 low TSH-question hyperthyroidism.  Will check free T4.  Further evaluation per primary care.  4 hypertension-patient's blood pressure is controlled.  Continue present medications.  HCTZ discontinued.  5 hyperlipidemia-continue statin.  6 diabetes mellitus-hemoglobin A1c elevated.  Further adjustment per primary care.  For questions or updates, please contact [facility] Please consult [email or web address] for contact info under        Signed, Olga Millers, MD  10/08/2019, 7:22 AM

## 2019-10-08 NOTE — Consult Note (Signed)
[facility] Consult ° °Referring Provider: [facility]/Dr.Regalado °Primary Care Physician:  Griffin, Elaine, MD °Primary Gastroenterologist: Dr.Schooler ° °Reason for Consultation:  Symptomatic anemia ° °HPI: Chelsea Gray is a 64 y.o. female was admitted on 10/07/2019 with shortness of breath and chest pain. °Patient had exertional shortness of breath which prompted her to come to the ER. °She has been feeling weak for some time. °She has also had about 30 pound weight loss in the last 6 months. °She denies nausea, vomiting, difficulty swallowing or pain on swallowing but has been experiencing acid reflux, heartburn and early satiety. °At home her bowel movements are regular, usually 1/day and she has not noted any blood in stool or black stools. °Her last colonoscopy was performed in 2012, reported as normal with recommendation to repeat in 10 years. °Patient has been taking Aleve intermittently as needed. ° °She is being prepared for cardiac catheterization on Tuesday, however due to symptomatic anemia and possible need for antiplatelet therapy if PCI is performed GI consult was requested for endoscopy and colonoscopy. ° °There is no family history of colon cancer. ° °Past Medical History:  °Diagnosis Date  °• Blood transfusion   ° d/t hemmorrhage post c- section  °• DEPRESSION 01/24/2009  °• DM 01/24/2009  °• H/O blood transfusion reaction 09/1992  ° cause PE and hemolytic type reaction per pt  °• History of one miscarriage   °• HYPERCHOLESTEROLEMIA 01/24/2009  °• HYPERTENSION 01/24/2009  °• Osteoarthritis   °• Pulmonary embolism (HCC)   ° result of blood transfusion reaction 18 years ago after childbirth  ° ° °Past Surgical History:  °Procedure Laterality Date  °• BREAST CYST EXCISION    °• BREAST REDUCTION SURGERY    °• CERVICAL CERCLAGE    °• CESAREAN SECTION    °• JOINT REPLACEMENT    °• KNEE ARTHROPLASTY  07/14/2011  ° Procedure: COMPUTER ASSISTED TOTAL KNEE ARTHROPLASTY;  Surgeon: Gregory  Scott Dean, MD;  Location: [facility] OR;  Service: Orthopedics;  Laterality: Left;  Left total knee arthroplasty  °• REDUCTION MAMMAPLASTY Bilateral   ° ° °Prior to Admission medications   °Medication Sig Start Date End Date Taking? Authorizing Provider  °atorvastatin (LIPITOR) 40 MG tablet Take 40 mg by mouth daily. 07/13/19  Yes [provider]  °FREESTYLE LITE test strip CHECK BLOOD SUGAR ONCE A DAY °Patient taking differently: 1 each by Other route daily.  08/27/15  Yes Ellison, Sean, MD  °hydrochlorothiazide (HYDRODIURIL) 12.5 MG tablet Take 12.5 mg by mouth every morning. 08/03/19  Yes [provider]  °INVOKANA 300 MG TABS tablet TAKE 1 TABLET BY MOUTH EVERY DAY °Patient taking differently: Take 300 mg by mouth daily before breakfast.  04/18/18  Yes Ellison, Sean, MD  °metFORMIN (GLUCOPHAGE) 500 MG tablet Take 1 tablet (500 mg total) by mouth 2 (two) times daily with a meal. °Patient taking differently: Take 500 mg by mouth daily with breakfast.  12/15/16  Yes Ellison, Sean, MD  °NOVOTWIST 32G X 5 MM MISC USE DAILY FOR INJECTION °Patient taking differently: 1 each by Other route daily.  05/21/15  Yes Ellison, Sean, MD  °olmesartan (BENICAR) 20 MG tablet Take 20 mg by mouth daily.   Yes [provider]  °TRULICITY 1.5 MG/0.5ML SOPN INJECT 1.5 MG INTO THE SKIN ONCE A WEEK. 09/21/16  Yes Ellison, Sean, MD  °meloxicam (MOBIC) 15 MG tablet TAKE 1 TABLET BY MOUTH EVERY DAY °Patient not taking: Reported on 10/07/2019 02/21/18   Dean, Gregory Scott, MD  °pioglitazone (  ACTOS) 45 MG tablet TAKE 1 TABLET (45 MG TOTAL) BY MOUTH DAILY. °Patient not taking: Reported on 10/07/2019 01/05/17   Ellison, Sean, MD  ° ° °Current Facility-Administered Medications  °Medication Dose Route Frequency Provider Last Rate Last Admin  °• acetaminophen (TYLENOL) tablet 650 mg  650 mg Oral Q6H PRN Zhang, Ping T, MD   650 mg at 10/07/19 2028  ° Or  °• acetaminophen (TYLENOL) suppository 650 mg  650 mg Rectal Q6H PRN Zhang, Ping T, MD       °• aspirin chewable tablet 81 mg  81 mg Oral Daily Crenshaw, Brian S, MD   81 mg at 10/08/19 1128  °• atorvastatin (LIPITOR) tablet 40 mg  40 mg Oral Daily Zhang, Ping T, MD   40 mg at 10/08/19 1128  °• bisacodyl (DULCOLAX) EC tablet 10 mg  10 mg Oral Once Ewing Fandino, MD      °• gabapentin (NEURONTIN) capsule 200 mg  200 mg Oral BID Regalado, Belkys A, MD      °• insulin aspart (novoLOG) injection 0-15 Units  0-15 Units Subcutaneous TID WC Zhang, Ping T, MD   8 Units at 10/08/19 1337  °• insulin aspart (novoLOG) injection 0-5 Units  0-5 Units Subcutaneous QHS Zhang, Ping T, MD      °• irbesartan (AVAPRO) tablet 150 mg  150 mg Oral Daily Zhang, Ping T, MD   150 mg at 10/08/19 1128  °• metoprolol tartrate (LOPRESSOR) tablet 12.5 mg  12.5 mg Oral BID Crenshaw, Brian S, MD   12.5 mg at 10/08/19 1127  °• nicotine (NICODERM CQ - dosed in mg/24 hours) patch 14 mg  14 mg Transdermal Daily Zhang, Ping T, MD   14 mg at 10/08/19 1128  °• ondansetron (ZOFRAN) injection 4 mg  4 mg Intravenous Q6H PRN Zhang, Ping T, MD   4 mg at 10/07/19 1701  °• pantoprazole (PROTONIX) EC tablet 40 mg  40 mg Oral Daily Crenshaw, Brian S, MD   40 mg at 10/08/19 1225  °• polyethylene glycol-electrolytes (NuLYTELY) solution 4,000 mL  4,000 mL Oral Once Keyerra Lamere, MD      °• traMADol (ULTRAM) tablet 50 mg  50 mg Oral Q12H PRN Regalado, Belkys A, MD   50 mg at 10/08/19 1139  ° ° °Allergies as of 10/07/2019 - Review Complete 10/07/2019  °Allergen Reaction Noted  °• Codeine sulfate Nausea And Vomiting and Other (See Comments) 01/24/2009  °• Metformin Diarrhea and Other (See Comments) 01/24/2009  ° ° °Family History  °Problem Relation Age of Onset  °• Diabetes Mother   °• Hypertension Mother   °• Diabetes Father   °• Arrhythmia Father   °• Heart disease Father   °     CABG  °• Breast cancer Sister   °• Diabetes Sister   °• Anesthesia problems Neg Hx   ° ° °Social History  ° °Socioeconomic History  °• Marital status: Married  °  Spouse name: Not  on file  °• Number of children: Not on file  °• Years of education: Not on file  °• Highest education level: Not on file  °Occupational History  °• Occupation: Insurance  °  Employer: [employer]  °Tobacco Use  °• Smoking status: Current Every Day Smoker  °  Packs/day: 0.10  °  Years: 6.00  °  Pack years: 0.60  °• Smokeless tobacco: Never Used  °Substance and Sexual Activity  °• Alcohol use: Yes  °  Comment: once drink every few   months  °• Drug use: No  °• Sexual activity: Not on file  °Other Topics Concern  °• Not on file  °Social History Narrative  °• Not on file  ° °Social Determinants of Health  ° °Financial Resource Strain:   °• Difficulty of Paying Living Expenses:   °Food Insecurity:   °• Worried About Running Out of Food in the Last Year:   °• Ran Out of Food in the Last Year:   °Transportation Needs:   °• Lack of Transportation (Medical):   °• Lack of Transportation (Non-Medical):   °Physical Activity:   °• Days of Exercise per Week:   °• Minutes of Exercise per Session:   °Stress:   °• Feeling of Stress :   °Social Connections:   °• Frequency of Communication with Friends and Family:   °• Frequency of Social Gatherings with Friends and Family:   °• Attends Religious Services:   °• Active Member of Clubs or Organizations:   °• Attends Club or Organization Meetings:   °• Marital Status:   °Intimate Partner Violence:   °• Fear of Current or Ex-Partner:   °• Emotionally Abused:   °• Physically Abused:   °• Sexually Abused:   ° ° °Review of Systems: Positive for: °GI: Described in detail in HPI.    °Gen:  involuntary weight loss, denies any fever, chills, rigors, night sweats, anorexia, fatigue, weakness, malaise,and sleep disorder °CV: chest pain, angina, denies , palpitations, syncope, orthopnea, PND, peripheral edema, and claudication. °Resp: dyspnea,  denies cough, sputum, wheezing, coughing up blood. °GU : Denies urinary burning, blood in urine, urinary frequency, urinary hesitancy, nocturnal urination,  and urinary incontinence. °MS: Denies joint pain or swelling.  Denies muscle weakness, cramps, atrophy.  °Derm: Denies rash, itching, oral ulcerations, hives, unhealing ulcers.  °Psych: Denies depression, anxiety, memory loss, suicidal ideation, hallucinations,  and confusion. °Heme: Denies bruising, bleeding, and enlarged lymph nodes. °Neuro:  Denies any headaches, dizziness, paresthesias. °Endo:  DM,Denies any problems with  thyroid, adrenal function. ° °Physical Exam: °Vital signs in last 24 hours: °Temp:  [98 °F (36.7 °C)-98.9 °F (37.2 °C)] 98.3 °F (36.8 °C) (07/04 0328) °Pulse Rate:  [66-93] 66 (07/04 0328) °Resp:  [16-18] 18 (07/04 0328) °BP: (117-158)/(54-76) 121/63 (07/04 0328) °SpO2:  [98 %-100 %] 98 % (07/04 0328) °Weight:  [75.7 kg-75.8 kg] 75.8 kg (07/04 0328) °Last BM Date: 10/06/19 ° °General:   Alert,  Well-developed, well-nourished, pleasant and cooperative in NAD °Head:  Normocephalic and atraumatic. °Eyes: Prominent pallor °Ears:  Normal auditory acuity. °Nose:  No deformity, discharge,  or lesions. °Mouth:  No deformity or lesions.  Oropharynx pink & moist. °Neck:  Supple; no masses or thyromegaly. °Lungs:  Clear throughout to auscultation.   No wheezes, crackles, or rhonchi. No acute distress. °Heart:  Regular rate and rhythm; no murmurs, clicks, rubs,  or gallops. °Extremities: Scar from right knee replacement, no edema °Neurologic:  Alert and  oriented x4;  grossly normal neurologically. °Skin:  Intact without significant lesions or rashes. °Psych:  Alert and cooperative. Normal mood and affect. °Abdomen:  Soft, nontender and nondistended. No masses, hepatosplenomegaly or hernias noted. Normal bowel sounds, without guarding, and without rebound.     ° ° ° ° °Lab Results: °Recent Labs  °  10/07/19 °0846 10/08/19 °0306  °WBC 7.3 7.5  °HGB 9.8* 8.6*  °HCT 33.2* 28.9*  °PLT 413* 349  ° °BMET °Recent Labs  °  10/07/19 °0846 10/08/19 °0306  °NA 135 136  °K 3.5   4.5  °CL 101 102  °CO2 19* 25  °GLUCOSE  455* 309*  °BUN 33* 27*  °CREATININE 1.12* 0.83  °CALCIUM 9.6 9.3  ° °LFT °No results for input(s): PROT, ALBUMIN, AST, ALT, ALKPHOS, BILITOT, BILIDIR, IBILI in the last 72 hours. °PT/INR °No results for input(s): LABPROT, INR in the last 72 hours. ° °Studies/Results: °DG Chest 2 View ° °Result Date: 10/07/2019 °CLINICAL DATA:  Central chest pain and shortness of breath EXAM: CHEST - 2 VIEW COMPARISON:  July 02, 2011 FINDINGS: The heart size and mediastinal contours are within normal limits. Both lungs are clear. The visualized skeletal structures are unremarkable. IMPRESSION: No active cardiopulmonary disease. Electronically Signed   By: Wei-Chen  Lin M.D.   On: 10/07/2019 09:37  ° °CT ANGIO CHEST PE W OR WO CONTRAST ° °Result Date: 10/07/2019 °CLINICAL DATA:  Shortness of breath.  Assess for pulmonary embolus. EXAM: CT ANGIOGRAPHY CHEST WITH CONTRAST TECHNIQUE: Multidetector CT imaging of the chest was performed using the standard protocol during bolus administration of intravenous contrast. Multiplanar CT image reconstructions and MIPs were obtained to evaluate the vascular anatomy. CONTRAST:  65mL OMNIPAQUE IOHEXOL 350 MG/ML SOLN COMPARISON:  Chest x-ray October 07, 2019 FINDINGS: Cardiovascular: Satisfactory opacification of the pulmonary arteries to the segmental level. No evidence of pulmonary embolism. The heart size is mildly enlarged. No pericardial effusion. Mediastinum/Nodes: No enlarged mediastinal, hilar, or axillary lymph nodes. Thyroid gland, trachea, and esophagus demonstrate no significant findings. Lungs/Pleura: Minimal atelectasis of posterior lung bases are noted. There is no focal pneumonia or pleural effusion. Upper Abdomen: No acute abnormality. Musculoskeletal: Minimal degenerative joint changes of thoracic spine are noted. Review of the MIP images confirms the above findings. IMPRESSION: 1. No pulmonary embolus. 2. Minimal atelectasis of posterior lung bases. Electronically Signed   By: Wei-Chen   Lin M.D.   On: 10/07/2019 12:31  ° °ECHOCARDIOGRAM COMPLETE ° °Result Date: 10/07/2019 °   ECHOCARDIOGRAM REPORT   Patient Name:   Chelsea Gray Date of Exam: 10/07/2019 Medical Rec #:  [id number]        Height:       65.0 in Accession #:    [id number]       Weight:       175.0 lb Date of Birth:  08/08/1955         BSA:          1.869 m² Patient Age:    64 years         BP:           135/75 mmHg Patient Gender: F                HR:           99 bpm. Exam Location:  Inpatient Procedure: 2D Echo, Cardiac Doppler and Color Doppler Indications:    Chest pain  History:        Patient has no prior history of Echocardiogram examinations.                 Signs/Symptoms:Chest Pain; Risk Factors:Diabetes, Hypertension                 and Dyslipidemia. Pul.emboli.  Sonographer:    Brooke Strickland Referring Phys: [id number] PING T ZHANG IMPRESSIONS  1. Left ventricular ejection fraction, by estimation, is 65 to 70%. The left ventricle has normal function. The left ventricle has no regional wall motion abnormalities. Left ventricular diastolic parameters are indeterminate.  2. Right ventricular systolic function is normal.   The right ventricular size is normal. There is normal pulmonary artery systolic pressure. The estimated right ventricular systolic pressure is 24.5 mmHg.  3. The mitral valve is grossly normal. Trivial mitral valve regurgitation.  4. The aortic valve is tricuspid. Aortic valve regurgitation is not visualized.  5. The inferior vena cava is normal in size with greater than 50% respiratory variability, suggesting right atrial pressure of 3 mmHg. FINDINGS  Left Ventricle: Left ventricular ejection fraction, by estimation, is 65 to 70%. The left ventricle has normal function. The left ventricle has no regional wall motion abnormalities. The left ventricular internal cavity size was normal in size. There is  borderline left ventricular hypertrophy. Left ventricular diastolic parameters are indeterminate. Right Ventricle:  The right ventricular size is normal. No increase in right ventricular wall thickness. Right ventricular systolic function is normal. There is normal pulmonary artery systolic pressure. The tricuspid regurgitant velocity is 2.32 m/s, and  with an assumed right atrial pressure of 3 mmHg, the estimated right ventricular systolic pressure is 24.5 mmHg. Left Atrium: Left atrial size was normal in size. Right Atrium: Right atrial size was normal in size. Pericardium: There is no evidence of pericardial effusion. Mitral Valve: The mitral valve is grossly normal. Trivial mitral valve regurgitation. Tricuspid Valve: The tricuspid valve is grossly normal. Tricuspid valve regurgitation is trivial. Aortic Valve: The aortic valve is tricuspid. Aortic valve regurgitation is not visualized. Mild aortic valve annular calcification. Pulmonic Valve: The pulmonic valve was grossly normal. Pulmonic valve regurgitation is trivial. Aorta: The aortic root is normal in size and structure. Venous: The inferior vena cava is normal in size with greater than 50% respiratory variability, suggesting right atrial pressure of 3 mmHg. IAS/Shunts: No atrial level shunt detected by color flow Doppler.  LEFT VENTRICLE PLAX 2D LVIDd:         4.20 cm  Diastology LVIDs:         2.40 cm  LV e' lateral:   9.68 cm/s LV PW:         1.00 cm  LV E/e' lateral: 6.2 LV IVS:        1.00 cm  LV e' medial:    5.98 cm/s LVOT diam:     1.80 cm  LV E/e' medial:  10.1 LV SV:         58 LV SV Index:   31 LVOT Area:     2.54 cm²  RIGHT VENTRICLE RV Basal diam:  2.80 cm RV S prime:     12.30 cm/s TAPSE (M-mode): 2.3 cm LEFT ATRIUM             Index       RIGHT ATRIUM          Index LA diam:        3.70 cm 1.98 cm/m²  RA Area:     9.66 cm² LA Vol (A2C):   38.2 ml 20.44 ml/m² RA Volume:   18.50 ml 9.90 ml/m² LA Vol (A4C):   34.8 ml 18.62 ml/m² LA Biplane Vol: 36.7 ml 19.64 ml/m²  AORTIC VALVE LVOT Vmax:   98.30 cm/s LVOT Vmean:  67.300 cm/s LVOT VTI:    0.229 m  AORTA Ao  Root diam: 2.90 cm MITRAL VALVE                TRICUSPID VALVE MV Area (PHT): 3.99 cm²     TR Peak grad:   21.5 mmHg MV Decel Time: 190 msec     TR Vmax:          232.00 cm/s MV E velocity: 60.20 cm/s MV A velocity: 102.00 cm/s  SHUNTS MV E/A ratio:  0.59         Systemic VTI:  0.23 m                             Systemic Diam: 1.80 cm Samuel Mcdowell MD Electronically signed by Samuel Mcdowell MD Signature Date/Time: 10/07/2019/12:50:46 PM    Final   ° °VAS US LOWER EXTREMITY VENOUS (DVT) ° °Result Date: 10/07/2019 ° Lower Venous DVTStudy Indications: Pain.  Comparison Study: Prior negative left lower extremity venous duplex from                   02/04/12 is available for comparison. Performing Technologist: Candace Kanady RVS  Examination Guidelines: A complete evaluation includes B-mode imaging, spectral Doppler, color Doppler, and power Doppler as needed of all accessible portions of each vessel. Bilateral testing is considered an integral part of a complete examination. Limited examinations for reoccurring indications may be performed as noted. The reflux portion of the exam is performed with the patient in reverse Trendelenburg.  +---------+---------------+---------+-----------+----------+--------------+  RIGHT     Compressibility Phasicity Spontaneity Properties Thrombus Aging  +---------+---------------+---------+-----------+----------+--------------+  CFV       Full            Yes       Yes                                    +---------+---------------+---------+-----------+----------+--------------+  SFJ       Full                                                             +---------+---------------+---------+-----------+----------+--------------+  FV Prox   Full                                                             +---------+---------------+---------+-----------+----------+--------------+  FV Mid    Full                                                              +---------+---------------+---------+-----------+----------+--------------+  FV Distal Full                                                             +---------+---------------+---------+-----------+----------+--------------+  PFV       Full                                                             +---------+---------------+---------+-----------+----------+--------------+    POP       Full            Yes       Yes                                    +---------+---------------+---------+-----------+----------+--------------+  PTV       Full                                                             +---------+---------------+---------+-----------+----------+--------------+  PERO      Full                                                             +---------+---------------+---------+-----------+----------+--------------+   +---------+---------------+---------+-----------+----------+--------------+  LEFT      Compressibility Phasicity Spontaneity Properties Thrombus Aging  +---------+---------------+---------+-----------+----------+--------------+  CFV       Full            Yes       Yes                                    +---------+---------------+---------+-----------+----------+--------------+  SFJ       Full                                                             +---------+---------------+---------+-----------+----------+--------------+  FV Prox   Full                                                             +---------+---------------+---------+-----------+----------+--------------+  FV Mid    Full                                                             +---------+---------------+---------+-----------+----------+--------------+  FV Distal Full                                                             +---------+---------------+---------+-----------+----------+--------------+  PFV       Full                                                              +---------+---------------+---------+-----------+----------+--------------+    POP       Full            Yes       Yes                                    +---------+---------------+---------+-----------+----------+--------------+  PTV       Full                                                             +---------+---------------+---------+-----------+----------+--------------+  PERO      Full                                                             +---------+---------------+---------+-----------+----------+--------------+     Summary: BILATERAL: - No evidence of deep vein thrombosis seen in the lower extremities, bilaterally. - RIGHT: - No cystic structure found in the popliteal fossa.  LEFT: - No cystic structure found in the popliteal fossa.  *See table(s) above for measurements and observations. Electronically signed by Christopher Clark MD on 10/07/2019 at 3:17:09 PM.    Final   ° ° °Impression: °Symptomatic anemia, hemoglobin 9.8/8.6 with MCV 76.5 °Elevated BUN/creatinine ratio 33/1.12 yesterday and 27/0.83 with history of NSAID use °Iron saturation 4% with elevated TIBC compatible with iron deficiency °Unintentional weight loss and  early satiety ° °Plan: °EGD and colonoscopy tomorrow. °Patient had a full breakfast of omelette and sausage, onions in the morning and a Caesar salad for lunch. °Hopefully the colonic prep given today will be enough to visualize colonic mucosa tomorrow. °We will keep patient on clear liquid diet and n.p.o. post midnight with orders written for colonic prep. °The risks and the benefits of the procedure were discussed with the patient in details.  She understands and verbalizes consent. ° ° LOS: 0 days  ° °Coree Riester, MD  10/08/2019, 2:25 PM ° °

## 2019-10-08 NOTE — Progress Notes (Signed)
Pt is complaining of 10/10 pain behind her calf region in both legs. Patient is requesting pain medication. Bruna Potter, MD paged. Awaiting orders, will continue to monitor.   Bari Edward, RN

## 2019-10-09 ENCOUNTER — Inpatient Hospital Stay (HOSPITAL_COMMUNITY): Payer: Medicare Other | Admitting: Certified Registered"

## 2019-10-09 ENCOUNTER — Encounter (HOSPITAL_COMMUNITY)
Admission: EM | Disposition: A | Discharge status: Home / Self Care | Payer: Self-pay | Source: Home / Self Care | Attending: Internal Medicine

## 2019-10-09 ENCOUNTER — Encounter (HOSPITAL_COMMUNITY): Payer: Self-pay | Admitting: Internal Medicine

## 2019-10-09 ENCOUNTER — Inpatient Hospital Stay (HOSPITAL_COMMUNITY): Payer: Medicare Other

## 2019-10-09 DIAGNOSIS — F17210 Nicotine dependence, cigarettes, uncomplicated: Secondary | ICD-10-CM

## 2019-10-09 DIAGNOSIS — D509 Iron deficiency anemia, unspecified: Secondary | ICD-10-CM

## 2019-10-09 DIAGNOSIS — I10 Essential (primary) hypertension: Secondary | ICD-10-CM

## 2019-10-09 DIAGNOSIS — M79661 Pain in right lower leg: Secondary | ICD-10-CM

## 2019-10-09 DIAGNOSIS — M79662 Pain in left lower leg: Secondary | ICD-10-CM

## 2019-10-09 DIAGNOSIS — R634 Abnormal weight loss: Secondary | ICD-10-CM

## 2019-10-09 DIAGNOSIS — G629 Polyneuropathy, unspecified: Secondary | ICD-10-CM

## 2019-10-09 DIAGNOSIS — E785 Hyperlipidemia, unspecified: Secondary | ICD-10-CM

## 2019-10-09 DIAGNOSIS — E0590 Thyrotoxicosis, unspecified without thyrotoxic crisis or storm: Principal | ICD-10-CM

## 2019-10-09 DIAGNOSIS — E1165 Type 2 diabetes mellitus with hyperglycemia: Secondary | ICD-10-CM

## 2019-10-09 DIAGNOSIS — Z794 Long term (current) use of insulin: Secondary | ICD-10-CM

## 2019-10-09 HISTORY — PX: BIOPSY: SHX5522

## 2019-10-09 HISTORY — PX: ESOPHAGOGASTRODUODENOSCOPY (EGD) WITH PROPOFOL: SHX5813

## 2019-10-09 HISTORY — PX: HOT HEMOSTASIS: SHX5433

## 2019-10-09 HISTORY — PX: POLYPECTOMY: SHX5525

## 2019-10-09 HISTORY — PX: HEMOSTASIS CLIP PLACEMENT: SHX6857

## 2019-10-09 HISTORY — PX: COLONOSCOPY WITH PROPOFOL: SHX5780

## 2019-10-09 LAB — BASIC METABOLIC PANEL
Anion gap: 9 (ref 5–15)
BUN: 21 mg/dL (ref 8–23)
CO2: 23 mmol/L (ref 22–32)
Calcium: 9.3 mg/dL (ref 8.9–10.3)
Chloride: 104 mmol/L (ref 98–111)
Creatinine, Ser: 0.68 mg/dL (ref 0.44–1.00)
GFR calc Af Amer: >60 mL/min (ref >60)
GFR calc non Af Amer: >60 mL/min (ref >60)
Glucose, Bld: 226 mg/dL — ABNORMAL HIGH (ref 70–99)
Potassium: 4.6 mmol/L (ref 3.5–5.1)
Sodium: 136 mmol/L (ref 135–145)

## 2019-10-09 LAB — CBC
HCT: 30.1 % — ABNORMAL LOW (ref 36.0–46.0)
Hemoglobin: 8.9 g/dL — ABNORMAL LOW (ref 12.0–15.0)
MCH: 22.6 pg — ABNORMAL LOW (ref 26.0–34.0)
MCHC: 29.6 g/dL — ABNORMAL LOW (ref 30.0–36.0)
MCV: 76.4 fL — ABNORMAL LOW (ref 80.0–100.0)
Platelets: 332 10*3/uL (ref 150–400)
RBC: 3.94 MIL/uL (ref 3.87–5.11)
RDW: 16.8 % — ABNORMAL HIGH (ref 11.5–15.5)
WBC: 6.4 10*3/uL (ref 4.0–10.5)
nRBC: 0 % (ref 0.0–0.2)

## 2019-10-09 LAB — T4, FREE: Free T4: 2.06 ng/dL — ABNORMAL HIGH (ref 0.61–1.12)

## 2019-10-09 LAB — VITAMIN B12: Vitamin B-12: 253 pg/mL (ref 180–914)

## 2019-10-09 LAB — GLUCOSE, CAPILLARY
Glucose-Capillary: 223 mg/dL — ABNORMAL HIGH (ref 70–99)
Glucose-Capillary: 202 mg/dL — ABNORMAL HIGH (ref 70–99)
Glucose-Capillary: 183 mg/dL — ABNORMAL HIGH (ref 70–99)
Glucose-Capillary: 329 mg/dL — ABNORMAL HIGH (ref 70–99)
Glucose-Capillary: 255 mg/dL — ABNORMAL HIGH (ref 70–99)

## 2019-10-09 LAB — T3, FREE: T3, Free: 8.2 pg/mL — ABNORMAL HIGH (ref 2.0–4.4)

## 2019-10-09 SURGERY — ESOPHAGOGASTRODUODENOSCOPY (EGD) WITH PROPOFOL
Anesthesia: Monitor Anesthesia Care

## 2019-10-09 MED ORDER — PROPOFOL 10 MG/ML IV BOLUS
INTRAVENOUS | Status: DC | PRN
  Administered 2019-10-09: 30 mg via INTRAVENOUS
  Administered 2019-10-09: 40 mg via INTRAVENOUS
  Administered 2019-10-09: 30 mg via INTRAVENOUS
  Administered 2019-10-09: 80 mg via INTRAVENOUS
  Administered 2019-10-09: 40 mg via INTRAVENOUS
  Administered 2019-10-09: 60 mg via INTRAVENOUS

## 2019-10-09 MED ORDER — PROPOFOL 500 MG/50ML IV EMUL
INTRAVENOUS | Status: DC | PRN
Start: 2019-10-09 — End: 2019-10-09
  Administered 2019-10-09: 75 ug/kg/min via INTRAVENOUS

## 2019-10-09 MED ORDER — LACTATED RINGERS IV SOLN: INTRAVENOUS | Status: DC | PRN

## 2019-10-09 MED ORDER — SODIUM CHLORIDE 0.9 % IV SOLN: INTRAVENOUS | Status: DC

## 2019-10-09 MED ORDER — VITAMIN B-12 1000 MCG PO TABS
1000.0000 ug | ORAL_TABLET | Freq: Every day | ORAL | Status: DC
  Administered 2019-10-09 – 2019-10-10 (×2): 1000 ug via ORAL
  Filled 2019-10-09 (×2): qty 1

## 2019-10-09 MED ORDER — METHIMAZOLE 10 MG PO TABS
20.0000 mg | ORAL_TABLET | Freq: Every day | ORAL | Status: DC
  Administered 2019-10-09 – 2019-10-10 (×2): 20 mg via ORAL
  Filled 2019-10-09 (×2): qty 2

## 2019-10-09 MED ORDER — LIDOCAINE 2% (20 MG/ML) 5 ML SYRINGE
INTRAMUSCULAR | Status: DC | PRN
  Administered 2019-10-09: 60 mg via INTRAVENOUS

## 2019-10-09 SURGICAL SUPPLY — 25 items

## 2019-10-09 NOTE — Op Note (Signed)
[facility] Patient Name: Chelsea Gray Procedure Date : 10/09/2019 MRN: [id number] Attending MD: Kerin Salen , MD Date of Birth: [birth year] CSN: [id number] Age: 64 Admit Type: Inpatient Procedure:                Colonoscopy Indications:              Last colonoscopy: 2012, Iron deficiency anemia Providers:                Kerin Salen, MD, Janae Sauce. Steele Berg, RN, Lawson Radar,                            Technician, Teresita Madura, CRNA Referring MD:             [facility] Medicines:                Monitored Anesthesia Care Complications:            No immediate complications. Estimated blood loss:                            Minimal. Estimated Blood Loss:     Estimated blood loss was minimal. Procedure:                Pre-Anesthesia Assessment:                           - Prior to the procedure, a History and Physical                            was performed, and patient medications and                            allergies were reviewed. The patient's tolerance of                            previous anesthesia was also reviewed. The risks                            and benefits of the procedure and the sedation                            options and risks were discussed with the patient.                            All questions were answered, and informed consent                            was obtained. Prior Anticoagulants: The patient has                            taken no previous anticoagulant or antiplatelet                            agents. ASA Grade Assessment: III - A patient with  severe systemic disease. After reviewing the risks                            and benefits, the patient was deemed in                            satisfactory condition to undergo the procedure.                           After obtaining informed consent, the colonoscope                            was passed under direct vision. Throughout the                             procedure, the patient's blood pressure, pulse, and                            oxygen saturations were monitored continuously. The                            PCF-H190DL ([id number] Olympus pediatric colonoscope                            was introduced through the anus and advanced to the                            the cecum, identified by appendiceal orifice and                            ileocecal valve. The colonoscopy was performed                            without difficulty. The patient tolerated the                            procedure well. The quality of the bowel                            preparation was fair. Scope In: 9:14:07 AM Scope Out: 9:31:23 AM Scope Withdrawal Time: 0 hours 14 minutes 17 seconds  Total Procedure Duration: 0 hours 17 minutes 16 seconds  Findings:      The perianal and digital rectal examinations were normal.      A 7 mm polyp was found in the transverse colon. The polyp was sessile.       The polyp was removed with a cold snare. Resection and retrieval were       complete. For hemostasis, one hemostatic clip was successfully placed       (MR conditional). There was no bleeding at the end of the procedure.      A moderate amount of semi-solid stool/undigested food particles was       found in the descending colon, in the transverse colon, in the ascending       colon and in the cecum, making visualization difficult. Lavage of  the       area was performed, resulting in clearance with fair visualization.      Non-bleeding internal hemorrhoids were found during retroflexion. The       hemorrhoids were medium-sized.      Three sessile polyps were found in the rectum. The polyps were 4 to 5 mm       in size. These polyps were removed with a piecemeal technique using a       cold biopsy forceps. Resection and retrieval were complete. Impression:               - Preparation of the colon was fair.                           - One 7 mm polyp in the  transverse colon, removed                            with a cold snare. Resected and retrieved. Clip (MR                            conditional) was placed.                           - Stool in the descending colon, in the transverse                            colon, in the ascending colon and in the cecum.                           - Non-bleeding internal hemorrhoids.                           - Three 4 to 5 mm polyps in the rectum, removed                            piecemeal using a cold biopsy forceps. Resected and                            retrieved. Moderate Sedation:      Patient did not receive moderate sedation for this procedure, but       instead received monitored anesthesia care. Recommendation:           - Resume regular diet.                           - Continue present medications.                           - Await pathology results.                           - Repeat colonoscopy in 1 year because the bowel                            preparation was suboptimal. Procedure Code(s):        --- Professional ---  [id number], Colonoscopy, flexible; with removal of                            tumor(s), polyp(s), or other lesion(s) by snare                            technique                           45380, 59, Colonoscopy, flexible; with biopsy,                            single or multiple Diagnosis Code(s):        --- Professional ---                           K64.8, Other hemorrhoids                           K63.5, Polyp of colon                           K62.1, Rectal polyp                           D50.9, Iron deficiency anemia, unspecified CPT copyright 2019 [employer]. All rights reserved. The codes documented in this report are preliminary and upon coder review may  be revised to meet current compliance requirements. Kerin SalenArya Mykenzi Vanzile, MD 10/09/2019 9:49:12 AM This report has been signed electronically. Number of Addenda: 0

## 2019-10-09 NOTE — Anesthesia Procedure Notes (Signed)
Procedure Name: MAC Date/Time: 10/09/2019 8:57 AM Performed by: Orlie Dakin, CRNA Pre-anesthesia Checklist: Patient identified, Emergency Drugs available, Suction available and Patient being monitored Patient Re-evaluated:Patient Re-evaluated prior to induction Oxygen Delivery Method: Nasal cannula Preoxygenation: Pre-oxygenation with 100% oxygen Induction Type: IV induction Placement Confirmation: positive ETCO2

## 2019-10-09 NOTE — Anesthesia Preprocedure Evaluation (Signed)
Anesthesia Evaluation  Patient identified by MRN, date of birth, ID band Patient awake    Reviewed: Allergy & Precautions, NPO status , Patient's Chart, lab work & pertinent test results  Airway Mallampati: II  TM Distance: >3 FB     Dental  (+) Dental Advisory Given   Pulmonary Current Smoker,    breath sounds clear to auscultation       Cardiovascular hypertension,  Rhythm:Regular Rate:Normal     Neuro/Psych  Neuromuscular disease    GI/Hepatic negative GI ROS, Neg liver ROS,   Endo/Other  diabetes, Type 2, Oral Hypoglycemic Agents  Renal/GU negative Renal ROS     Musculoskeletal  (+) Arthritis ,   Abdominal   Peds  Hematology  (+) anemia ,   Anesthesia Other Findings   Reproductive/Obstetrics                             Anesthesia Physical Anesthesia Plan  ASA: III  Anesthesia Plan: MAC   Post-op Pain Management:    Induction: Intravenous  PONV Risk Score and Plan: 1 and Propofol infusion, Ondansetron and Treatment may vary due to age or medical condition  Airway Management Planned: Natural Airway and Nasal Cannula  Additional Equipment:   Intra-op Plan:   Post-operative Plan:   Informed Consent: I have reviewed the patients History and Physical, chart, labs and discussed the procedure including the risks, benefits and alternatives for the proposed anesthesia with the patient or authorized representative who has indicated his/her understanding and acceptance.       Plan Discussed with: CRNA  Anesthesia Plan Comments:         Anesthesia Quick Evaluation

## 2019-10-09 NOTE — Addendum Note (Signed)
Addendum  created 10/09/19 1244 by Julian Reil, CRNA   LDA properties accepted

## 2019-10-09 NOTE — Anesthesia Postprocedure Evaluation (Signed)
Anesthesia Post Note  Patient: Chelsea Gray  Procedure(s) Performed: ESOPHAGOGASTRODUODENOSCOPY (EGD) WITH PROPOFOL (N/A ) COLONOSCOPY WITH PROPOFOL (N/A ) BIOPSY HOT HEMOSTASIS (ARGON PLASMA COAGULATION/BICAP) (N/A ) POLYPECTOMY HEMOSTASIS CLIP PLACEMENT     Patient location during evaluation: PACU Anesthesia Type: MAC Level of consciousness: awake and alert Pain management: pain level controlled Vital Signs Assessment: post-procedure vital signs reviewed and stable Respiratory status: spontaneous breathing, nonlabored ventilation, respiratory function stable and patient connected to nasal cannula oxygen Cardiovascular status: stable and blood pressure returned to baseline Postop Assessment: no apparent nausea or vomiting Anesthetic complications: no   No complications documented.  Last Vitals:  Vitals:   10/09/19 0955 10/09/19 1045  BP: 136/68 (!) 146/79  Pulse: 67   Resp: 13 17  Temp: 36.6 C 36.7 C  SpO2: 97% 97%    Last Pain:  Vitals:   10/09/19 1045  TempSrc: Oral  PainSc:                  Tiajuana Amass

## 2019-10-09 NOTE — Op Note (Addendum)
EGD and colonoscopy were performed for iron deficiency anemia and weight loss.   Findings: Widely patent Schatzki's ring, 5 cm hiatal hernia. 2 gastric AVMs, 4 duodenal AVMs, all treated with APC, 1 Endo Clip placed in duodenum. Superficial linear antral ulcers, biopsies taken for H. Pylori.   Transverse polyp, removed with cold polypectomy, 1 Endo Clip placed. Small internal hemorrhoids. Undigested food and stool noted in colon, fair visualization.   Recommendations: PPI once a day for 4 weeks. Biopsy for H. pylori and pathology on polyp can be followed as an outpatient. Resume regular diet. GI will sign off, please recall if needed  Kerin Salen, MD

## 2019-10-09 NOTE — Progress Notes (Signed)
Progress Note  Patient Name: Chelsea Gray Date of Encounter: 10/09/2019  [facility] Cardiologist: Dr Jens Som  Subjective   Pt denies CP or dyspnea  Inpatient Medications    Scheduled Meds: . aspirin  81 mg Oral Daily  . atorvastatin  40 mg Oral Daily  . gabapentin  200 mg Oral BID  . insulin aspart  0-15 Units Subcutaneous TID WC  . insulin aspart  0-5 Units Subcutaneous QHS  . insulin glargine  10 Units Subcutaneous Daily  . irbesartan  150 mg Oral Daily  . metoprolol tartrate  12.5 mg Oral BID  . nicotine  14 mg Transdermal Daily  . pantoprazole  40 mg Oral Daily   Continuous Infusions:  PRN Meds: acetaminophen **OR** acetaminophen, ondansetron (ZOFRAN) IV, traMADol   Vital Signs    Vitals:   10/08/19 0328 10/08/19 1632 10/08/19 2015 10/09/19 0259  BP: 121/63 125/69 (!) 158/80 129/70  Pulse: 66 70 71 74  Resp: Temp: 98.3 F (36.8 C) 98 F (36.7 C) 97.7 F (36.5 C) 98.3 F (36.8 C)  TempSrc: Oral Oral Oral Oral  SpO2: 98% 99% 100% 100%  Weight: 75.8 kg   75.5 kg  Height:        Intake/Output Summary (Last 24 hours) at 10/09/2019 0741 Last data filed at 10/08/2019 2135 Gross per 24 hour  Intake 240 ml  Output --  Net 240 ml   Last 3 Weights 10/09/2019 10/08/2019 10/07/2019  Weight (lbs) 166 lb 6.4 oz 167 lb 1.6 oz 166 lb 14.4 oz  Weight (kg) 75.479 kg 75.796 kg 75.705 kg      Telemetry    Sinus- Personally Reviewed  Physical Exam   GEN: WD/WN, NAD Neck: No JVD, supple Cardiac: RRR Respiratory: CTA GI: Soft, NT/ND MS: No edema Neuro:  Grossly intact Psych: Normal affect   Labs    High Sensitivity Troponin:   Recent Labs  Lab 10/07/19 0846 10/07/19 1027 10/07/19 2047  TROPONINIHS 13 19* 21*      Chemistry Recent Labs  Lab 10/07/19 0846 10/08/19 0306 10/09/19 0256  NA 135 136 136  K 3.5 4.5 4.6  CL 101 102 104  CO2 19* 25 23  GLUCOSE 455* 309* 226*  BUN 33* 27* 21  CREATININE 1.12* 0.83 0.68  CALCIUM 9.6 9.3  9.3  GFRNONAA 52* >60 >60  GFRAA 60* >60 >60  ANIONGAP Hematology Recent Labs  Lab 10/07/19 0846 10/08/19 0306 10/09/19 0256  WBC 7.3 7.5 6.4  RBC 4.28 3.78* 3.94  HGB 9.8* 8.6* 8.9*  HCT 33.2* 28.9* 30.1*  MCV 77.6* 76.5* 76.4*  MCH 22.9* 22.8* 22.6*  MCHC 29.5* 29.8* 29.6*  RDW 17.2* 17.0* 16.8*  PLT 413* 349 332    DDimer  Recent Labs  Lab 10/07/19 0945  DDIMER 0.51*     Radiology    DG Chest 2 View  Result Date: 10/07/2019 CLINICAL DATA:  Central chest pain and shortness of breath EXAM: CHEST - 2 VIEW COMPARISON:  July 02, 2011 FINDINGS: The heart size and mediastinal contours are within normal limits. Both lungs are clear. The visualized skeletal structures are unremarkable. IMPRESSION: No active cardiopulmonary disease. Electronically Signed   By: Sherian Rein M.D.   On: 10/07/2019 09:37   CT ANGIO CHEST PE W OR WO CONTRAST  Result Date: 10/07/2019 CLINICAL DATA:  Shortness of breath.  Assess for pulmonary embolus. EXAM: CT ANGIOGRAPHY CHEST WITH CONTRAST TECHNIQUE:  Multidetector CT imaging of the chest was performed using the standard protocol during bolus administration of intravenous contrast. Multiplanar CT image reconstructions and MIPs were obtained to evaluate the vascular anatomy. CONTRAST:  57mL OMNIPAQUE IOHEXOL 350 MG/ML SOLN COMPARISON:  Chest x-ray October 07, 2019 FINDINGS: Cardiovascular: Satisfactory opacification of the pulmonary arteries to the segmental level. No evidence of pulmonary embolism. The heart size is mildly enlarged. No pericardial effusion. Mediastinum/Nodes: No enlarged mediastinal, hilar, or axillary lymph nodes. Thyroid gland, trachea, and esophagus demonstrate no significant findings. Lungs/Pleura: Minimal atelectasis of posterior lung bases are noted. There is no focal pneumonia or pleural effusion. Upper Abdomen: No acute abnormality. Musculoskeletal: Minimal degenerative joint changes of thoracic spine are noted. Review of the  MIP images confirms the above findings. IMPRESSION: 1. No pulmonary embolus. 2. Minimal atelectasis of posterior lung bases. Electronically Signed   By: Sherian Rein M.D.   On: 10/07/2019 12:31   ECHOCARDIOGRAM COMPLETE  Result Date: 10/07/2019    ECHOCARDIOGRAM REPORT   Patient Name:   Chelsea Gray Date of Exam: 10/07/2019 Medical Rec #:  [id number]        Height:       65.0 in Accession #:    [id number]       Weight:       175.0 lb Date of Birth:  29-Mar-1956         BSA:          1.869 m Patient Age:    64 years         BP:           135/75 mmHg Patient Gender: F                HR:           99 bpm. Exam Location:  Inpatient Procedure: 2D Echo, Cardiac Doppler and Color Doppler Indications:    Chest pain  History:        Patient has no prior history of Echocardiogram examinations.                 Signs/Symptoms:Chest Pain; Risk Factors:Diabetes, Hypertension                 and Dyslipidemia. Pul.emboli.  Sonographer:    Lavenia Atlas Referring Phys: [id number] PING T ZHANG IMPRESSIONS  1. Left ventricular ejection fraction, by estimation, is 65 to 70%. The left ventricle has normal function. The left ventricle has no regional wall motion abnormalities. Left ventricular diastolic parameters are indeterminate.  2. Right ventricular systolic function is normal. The right ventricular size is normal. There is normal pulmonary artery systolic pressure. The estimated right ventricular systolic pressure is 24.5 mmHg.  3. The mitral valve is grossly normal. Trivial mitral valve regurgitation.  4. The aortic valve is tricuspid. Aortic valve regurgitation is not visualized.  5. The inferior vena cava is normal in size with greater than 50% respiratory variability, suggesting right atrial pressure of 3 mmHg. FINDINGS  Left Ventricle: Left ventricular ejection fraction, by estimation, is 65 to 70%. The left ventricle has normal function. The left ventricle has no regional wall motion abnormalities. The left ventricular  internal cavity size was normal in size. There is  borderline left ventricular hypertrophy. Left ventricular diastolic parameters are indeterminate. Right Ventricle: The right ventricular size is normal. No increase in right ventricular wall thickness. Right ventricular systolic function is normal. There is normal pulmonary artery systolic pressure. The tricuspid regurgitant velocity is 2.32 m/s, and  with an assumed right atrial pressure of 3 mmHg, the estimated right ventricular systolic pressure is 24.5 mmHg. Left Atrium: Left atrial size was normal in size. Right Atrium: Right atrial size was normal in size. Pericardium: There is no evidence of pericardial effusion. Mitral Valve: The mitral valve is grossly normal. Trivial mitral valve regurgitation. Tricuspid Valve: The tricuspid valve is grossly normal. Tricuspid valve regurgitation is trivial. Aortic Valve: The aortic valve is tricuspid. Aortic valve regurgitation is not visualized. Mild aortic valve annular calcification. Pulmonic Valve: The pulmonic valve was grossly normal. Pulmonic valve regurgitation is trivial. Aorta: The aortic root is normal in size and structure. Venous: The inferior vena cava is normal in size with greater than 50% respiratory variability, suggesting right atrial pressure of 3 mmHg. IAS/Shunts: No atrial level shunt detected by color flow Doppler.  LEFT VENTRICLE PLAX 2D LVIDd:         4.20 cm  Diastology LVIDs:         2.40 cm  LV e' lateral:   9.68 cm/s LV PW:         1.00 cm  LV E/e' lateral: 6.2 LV IVS:        1.00 cm  LV e' medial:    5.98 cm/s LVOT diam:     1.80 cm  LV E/e' medial:  10.1 LV SV:         58 LV SV Index:   31 LVOT Area:     2.54 cm  RIGHT VENTRICLE RV Basal diam:  2.80 cm RV S prime:     12.30 cm/s TAPSE (M-mode): 2.3 cm LEFT ATRIUM             Index       RIGHT ATRIUM          Index LA diam:        3.70 cm 1.98 cm/m  RA Area:     9.66 cm LA Vol (A2C):   38.2 ml 20.44 ml/m RA Volume:   18.50 ml 9.90 ml/m LA  Vol (A4C):   34.8 ml 18.62 ml/m LA Biplane Vol: 36.7 ml 19.64 ml/m  AORTIC VALVE LVOT Vmax:   98.30 cm/s LVOT Vmean:  67.300 cm/s LVOT VTI:    0.229 m  AORTA Ao Root diam: 2.90 cm MITRAL VALVE                TRICUSPID VALVE MV Area (PHT): 3.99 cm     TR Peak grad:   21.5 mmHg MV Decel Time: 190 msec     TR Vmax:        232.00 cm/s MV E velocity: 60.20 cm/s MV A velocity: 102.00 cm/s  SHUNTS MV E/A ratio:  0.59         Systemic VTI:  0.23 m                             Systemic Diam: 1.80 cm Nona Dell MD Electronically signed by Nona Dell MD Signature Date/Time: 10/07/2019/12:50:46 PM    Final    VAS [place] LOWER EXTREMITY VENOUS (DVT)  Result Date: 10/07/2019  Lower Venous DVTStudy Indications: Pain.  Comparison Study: Prior negative left lower extremity venous duplex from                   02/04/12 is available for comparison. Performing Technologist: Sherren Kerns RVS  Examination Guidelines: A complete evaluation includes B-mode imaging, spectral Doppler, color Doppler, and  power Doppler as needed of all accessible portions of each vessel. Bilateral testing is considered an integral part of a complete examination. Limited examinations for reoccurring indications may be performed as noted. The reflux portion of the exam is performed with the patient in reverse Trendelenburg.  +---------+---------------+---------+-----------+----------+--------------+ RIGHT    CompressibilityPhasicitySpontaneityPropertiesThrombus Aging +---------+---------------+---------+-----------+----------+--------------+ CFV      Full           Yes      Yes                                 +---------+---------------+---------+-----------+----------+--------------+ SFJ      Full                                                        +---------+---------------+---------+-----------+----------+--------------+ FV Prox  Full                                                         +---------+---------------+---------+-----------+----------+--------------+ FV Mid   Full                                                        +---------+---------------+---------+-----------+----------+--------------+ FV DistalFull                                                        +---------+---------------+---------+-----------+----------+--------------+ PFV      Full                                                        +---------+---------------+---------+-----------+----------+--------------+ POP      Full           Yes      Yes                                 +---------+---------------+---------+-----------+----------+--------------+ PTV      Full                                                        +---------+---------------+---------+-----------+----------+--------------+ PERO     Full                                                        +---------+---------------+---------+-----------+----------+--------------+   +---------+---------------+---------+-----------+----------+--------------+  LEFT     CompressibilityPhasicitySpontaneityPropertiesThrombus Aging +---------+---------------+---------+-----------+----------+--------------+ CFV      Full           Yes      Yes                                 +---------+---------------+---------+-----------+----------+--------------+ SFJ      Full                                                        +---------+---------------+---------+-----------+----------+--------------+ FV Prox  Full                                                        +---------+---------------+---------+-----------+----------+--------------+ FV Mid   Full                                                        +---------+---------------+---------+-----------+----------+--------------+ FV DistalFull                                                         +---------+---------------+---------+-----------+----------+--------------+ PFV      Full                                                        +---------+---------------+---------+-----------+----------+--------------+ POP      Full           Yes      Yes                                 +---------+---------------+---------+-----------+----------+--------------+ PTV      Full                                                        +---------+---------------+---------+-----------+----------+--------------+ PERO     Full                                                        +---------+---------------+---------+-----------+----------+--------------+     Summary: BILATERAL: - No evidence of deep vein thrombosis seen in the lower extremities, bilaterally. - RIGHT: - No cystic structure found in the popliteal fossa.  LEFT: - No cystic structure found in the popliteal fossa.  *See table(s) above for measurements  and observations. Electronically signed by Sherald Hess MD on 10/07/2019 at 3:17:09 PM.    Final     Patient Profile     64 y.o. female with past medical history of diabetes mellitus, hypertension, tobacco abuse, prior pulmonary embolus for evaluation of unstable angina.  Echocardiogram shows hyperdynamic LV function.  Assessment & Plan    1 unstable angina-patient continues to be pain-free.  Heparin on hold due to microcytic anemia.  Continue aspirin, beta-blocker and statin. Given microcytic anemia and weight loss gastroenterology has evaluated and plans EGD and colonoscopy today.  She is also noted to be hyperthyroid.  IV contrast could exacerbate.  We will cancel cardiac catheterization for now.  May need to consider nuclear study for risk stratification given above issues instead of cardiac catheterization.  2 microcytic anemia/weight loss-her most recent hemoglobin was from 2017 and was normal.  Anemia appears to be new.  Her labs also are consistent with iron  deficiency.  She will continue Protonix.  She has received IV iron infusion.  Gastroenterology plans EGD and colonoscopy today.  Continue to follow hemoglobin.    3 low TSH-patient appears to be hyperthyroid which may explain weight loss.  Further therapy per primary care.  4 hypertension-patient's blood pressure is controlled.  Continue present medications.  HCTZ discontinued.  5 hyperlipidemia-continue statin.  6 diabetes mellitus-hemoglobin A1c elevated.  Further adjustment per primary care.  For questions or updates, please contact [facility] Please consult [email or web address] for contact info under        Signed, Olga Millers, MD  10/09/2019, 7:41 AM

## 2019-10-09 NOTE — Brief Op Note (Signed)
10/07/2019 - 10/09/2019  9:49 AM  PATIENT:  Lester Kinsman  64 y.o. female  PRE-OPERATIVE DIAGNOSIS:  iron deficiency anemia  POST-OPERATIVE DIAGNOSIS:  AVMs duodenum, gastric avms,m transverse polyp, 3 rectal polyp  PROCEDURE:  Procedure(s): ESOPHAGOGASTRODUODENOSCOPY (EGD) WITH PROPOFOL (N/A) COLONOSCOPY WITH PROPOFOL (N/A) BIOPSY HOT HEMOSTASIS (ARGON PLASMA COAGULATION/BICAP) (N/A) POLYPECTOMY HEMOSTASIS CLIP PLACEMENT  SURGEON:  Surgeon(s) and Role:    Ronnette Juniper, MD - Primary  PHYSICIAN ASSISTANT:   ASSISTANTS: Tory Emerald, RN, Laverda Sorenson, Tech  ANESTHESIA:   MAC  EBL:  Minimal  BLOOD ADMINISTERED:none  DRAINS: none   LOCAL MEDICATIONS USED:  NONE  SPECIMEN:  Biopsy / Limited Resection  DISPOSITION OF SPECIMEN:  PATHOLOGY  COUNTS:  YES  TOURNIQUET:  * No tourniquets in log *  DICTATION: .Dragon Dictation  PLAN OF CARE: Admit to inpatient   PATIENT DISPOSITION:  PACU - hemodynamically stable.   Delay start of Pharmacological VTE agent (>24hrs) due to surgical blood loss or risk of bleeding: not applicable

## 2019-10-09 NOTE — Transfer of Care (Signed)
Immediate Anesthesia Transfer of Care Note  Patient: Chelsea Gray  Procedure(s) Performed: ESOPHAGOGASTRODUODENOSCOPY (EGD) WITH PROPOFOL (N/A ) COLONOSCOPY WITH PROPOFOL (N/A ) BIOPSY HOT HEMOSTASIS (ARGON PLASMA COAGULATION/BICAP) (N/A ) POLYPECTOMY HEMOSTASIS CLIP PLACEMENT  Patient Location: PACU  Anesthesia Type:MAC  Level of Consciousness: awake, oriented and patient cooperative  Airway & Oxygen Therapy: Patient Spontanous Breathing and Patient connected to nasal cannula oxygen  Post-op Assessment: Report given to RN, Post -op Vital signs reviewed and stable and Patient moving all extremities X 4  Post vital signs: Reviewed and stable  Last Vitals:  Vitals Value Taken Time  BP 126/90 10/09/19 0937  Temp    Pulse    Resp 17 10/09/19 0940  SpO2    Vitals shown include unvalidated device data.  Last Pain:  Vitals:   10/09/19 0850  TempSrc: Oral  PainSc:       Patients Stated Pain Goal: 0 (73/42/87 [id number])  Complications: No complications documented.

## 2019-10-09 NOTE — Op Note (Signed)
[facility] Patient Name: Chelsea Gray Procedure Date : 10/09/2019 MRN: [id number] Attending MD: Kerin Salen , MD Date of Birth: 1955/07/18 CSN: [id number] Age: 64 Admit Type: Inpatient Procedure:                Upper GI endoscopy Indications:              Iron deficiency anemia, weight loss Providers:                Kerin Salen, MD, Janae Sauce. Steele Berg, RN, Lawson Radar,                            Technician, Teresita Madura, CRNA Referring MD:             [facility] Medicines:                Monitored Anesthesia Care Complications:            No immediate complications. Estimated blood loss:                            Minimal. Estimated Blood Loss:     Estimated blood loss was minimal. Procedure:                Pre-Anesthesia Assessment:                           - Prior to the procedure, a History and Physical                            was performed, and patient medications and                            allergies were reviewed. The patient's tolerance of                            previous anesthesia was also reviewed. The risks                            and benefits of the procedure and the sedation                            options and risks were discussed with the patient.                            All questions were answered, and informed consent                            was obtained. Prior Anticoagulants: The patient has                            taken no previous anticoagulant or antiplatelet                            agents. ASA Grade Assessment: III - A patient with  severe systemic disease. After reviewing the risks                            and benefits, the patient was deemed in                            satisfactory condition to undergo the procedure.                           After obtaining informed consent, the endoscope was                            passed under direct vision. Throughout the                             procedure, the patient's blood pressure, pulse, and                            oxygen saturations were monitored continuously. The                            GIF-H190 ([id number]) Olympus gastroscope was                            introduced through the mouth, and advanced to the                            second part of duodenum. The upper GI endoscopy was                            accomplished without difficulty. The patient                            tolerated the procedure well. Scope In: Scope Out: Findings:      A widely patent Schatzki ring was found at the gastroesophageal junction.      The upper third of the esophagus, middle third of the esophagus and       lower third of the esophagus were normal.      A 5 cm hiatal hernia was present.      Few non-bleeding linear and superficial gastric ulcers with a clean       ulcer base (Forrest Class III) were found in the gastric antrum. The       largest lesion was 5 mm in largest dimension. Biopsies were taken with a       cold forceps for Helicobacter pylori testing.      The cardia and gastric fundus were normal on retroflexion.      Two 4 mm angioectasias with no bleeding were found in the gastric body.       One was oozing while the other was not. Coagulation for tissue       destruction using argon plasma at 1 liter/minute and 20 watts was       successful.      Four 3 to 4 mm angioectasias with typical arborization were found in the       first portion  of the duodenum and in the second portion of the duodenum.       Coagulation for tissue destruction using argon plasma at 0.5       liters/minute and 15 watts was successful. To prevent bleeding       post-intervention, one hemostatic clip was successfully placed (MR       conditional). There was no bleeding at the end of the procedure. Impression:               - Widely patent Schatzki ring.                           - Normal upper third of esophagus, middle third of                             esophagus and lower third of esophagus.                           - 5 cm hiatal hernia.                           - Non-bleeding gastric ulcers with a clean ulcer                            base (Forrest Class III). Biopsied.                           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                            Treated with argon plasma coagulation (APC).                           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 Treated with                            argon plasma coagulation (APC). Clip (MR                            conditional) was placed. Moderate Sedation:      Patient did not receive moderate sedation for this procedure, but       instead received monitored anesthesia care. Recommendation:           - Perform a colonoscopy today. Procedure Code(s):        --- Professional ---                           [phone], Esophagogastroduodenoscopy, flexible,                            transoral; with ablation of tumor(s), polyp(s), or                            other lesion(s) (includes pre- and post-dilation  and guide wire passage, when performed)                           43239, 59, Esophagogastroduodenoscopy, flexible,                            transoral; with biopsy, single or multiple Diagnosis Code(s):        --- Professional ---                           K22.2, Esophageal obstruction                           K44.9, Diaphragmatic hernia without obstruction or                            gangrene                           K25.9, Gastric ulcer, unspecified as acute or                            chronic, without hemorrhage or perforation                           K31.819, Angiodysplasia of stomach and duodenum                            without bleeding                           D50.9, Iron deficiency anemia, unspecified CPT copyright 2019 American Medical Association. All rights reserved. The codes documented in this report  are preliminary and upon coder review may  be revised to meet current compliance requirements. Kerin Salen, MD 10/09/2019 9:45:10 AM This report has been signed electronically. Number of Addenda: 0

## 2019-10-09 NOTE — Progress Notes (Signed)
PROGRESS NOTE    Chelsea Gray  YBW:[id number] DOB: September 08, 1955 DOA: 10/07/2019 PCP: Maurice Small, MD   Brief Narrative: 64 year old with past medical history significant for insulin-dependent diabetes, hypertension, remote history of DVT and PE during pregnancy, presents with new onset chest pain.  Patient started on exertion during her morning walk pressure-like, 7 out of 10 associated with shortness of breath and palpitation.  She had mildly elevated D-dimer, EKG with T wave flattening.   Assessment & Plan:   Active Problems:   Diabetes (HCC)   HYPERCHOLESTEROLEMIA   SMOKER   Essential hypertension   Chest pain with moderate risk for cardiac etiology   Chest pain   Neuropathy   Iron deficiency anemia due to chronic blood loss  1-Chest pain/Angina;  Cardiology following Chest pain-free. Reports chest pressure..  Full: No wall motion abnormality, ejection fraction 70% CT angio chest negative for PE. HLD; started on statins.  TSH low, Free T 3 and T 4 elevated.  Cath cancel, to avoid worsening thyroid function. Might need stress test per cardio.   Hyperthyroidism; Presents with mild tremors, anxiety, 30 pounds weight lost, chest tightness.  TSH;0.01, Free T 3: 8.2, Free T 4 2.0 Discussed with endocrinologist, will start methimazole.  Follow up with Dr Sharl Ma on Wednesday at 3 pm.  Follow thyroid US.   Anemia, iron deficiency: Patient report 30 pound weight loss in 6 months. Plan for IV iron GI evaluation.  Endoscopy: Widely patent Schatzki 's ring, 5 cm hiatal hernia. 2 gastric AVM, four AVM duodenum, all treated with APC, 1 Endo Clip placed in the duodenum.  Superficial linear antral ulcers, biopsy taken for H. Pylori. Once versus polyp status post removed with cold polypectomy, 1 Endo Clip placed.  A small internal hemorrhoid.  Undigested fluid and stool noted in:, Fair visualization. PPI daily for 4 weeks.   Bilateral calf pain: Dopplers negative for  DVT  Diabetes type 2 hyperglycemia, uncontrolled Hb A1c 8.8 _Sliding scale insulin Resume Invokana Start low-dose Lantus  Hypertension; Continue with Avapro metoprolol  History of neuropathy; continue with gabapentin  Current smoker     Estimated body mass index is 27.69 kg/m as calculated from the following:   Height as of this encounter: 5\' 5"  (1.651 m).   Weight as of this encounter: 75.5 kg.   DVT prophylaxis: SCDs undergoing evaluation for anemia Code Status: Full code Family Communication: Discussed with patient and husband at bedside Disposition Plan:  Status is: Inpatient  Dispo: The patient is from: Home              Anticipated d/c is to: Home              Anticipated d/c date is: 3 days              Patient currently is not medically stable to d/c.  Patient presents with angina plan for heart cath on Tuesday after GI evaluation for anemia        Consultants:   Cardiology  GI  Procedures:   Echo: Normal ejection fraction  Antimicrobials:    Subjective: She has at times feel chest pressure. She does report some mild tremors.  She also reports anxiety. She reports lower extremity pain and aches  Objective: Vitals:   10/09/19 0940 10/09/19 0945 10/09/19 0955 10/09/19 1045  BP: 126/90  136/68 (!) 146/79  Pulse:   67   Resp: 17  13 17   Temp: 97.8 F (36.6 C)  97.9 F (36.6 C)  98 F (36.7 C)  TempSrc:    Oral  SpO2: 100% 100% 97% 97%  Weight:      Height:        Intake/Output Summary (Last 24 hours) at 10/09/2019 1326 Last data filed at 10/09/2019 0940 Gross per 24 hour  Intake 640 ml  Output --  Net 640 ml   Filed Weights   10/07/19 1538 10/08/19 0328 10/09/19 0259  Weight: 75.7 kg 75.8 kg 75.5 kg    Examination:  General exam: NAD Respiratory system: Clear to auscultation Cardiovascular system: S1, S2 regular rhythm or rate Gastrointestinal system: Bowel sounds present, soft nontender nondistended Central nervous system:  Alert and oriented Extremities: Symmetric power`   Data Reviewed: I have personally reviewed following labs and imaging studies  CBC: Recent Labs  Lab 10/07/19 0846 10/08/19 0306 10/09/19 0256  WBC 7.3 7.5 6.4  HGB 9.8* 8.6* 8.9*  HCT 33.2* 28.9* 30.1*  MCV 77.6* 76.5* 76.4*  PLT 413* 349 332   Basic Metabolic Panel: Recent Labs  Lab 10/07/19 0846 10/08/19 0306 10/09/19 0256  NA 135 136 136  K 3.5 4.5 4.6  CL 101 102 104  CO2 19* 25 23  GLUCOSE 455* 309* 226*  BUN 33* 27* 21  CREATININE 1.12* 0.83 0.68  CALCIUM 9.6 9.3 9.3   GFR: Estimated Creatinine Clearance: 71.3 mL/min (by C-G formula based on SCr of 0.68 mg/dL). Liver Function Tests: No results for input(s): AST, ALT, ALKPHOS, BILITOT, PROT, ALBUMIN in the last 168 hours. No results for input(s): LIPASE, AMYLASE in the last 168 hours. No results for input(s): AMMONIA in the last 168 hours. Coagulation Profile: No results for input(s): INR, PROTIME in the last 168 hours. Cardiac Enzymes: No results for input(s): CKTOTAL, CKMB, CKMBINDEX, TROPONINI in the last 168 hours. BNP (last 3 results) No results for input(s): PROBNP in the last 8760 hours. HbA1C: Recent Labs    10/07/19 1412  HGBA1C 8.8*   CBG: Recent Labs  Lab 10/08/19 1827 10/08/19 2110 10/09/19 0740 10/09/19 0940 10/09/19 1110  GLUCAP 210* 181* 223* 202* 183*   Lipid Profile: Recent Labs    10/07/19 1412  CHOL 299*  HDL 50  LDLCALC UNABLE TO CALCULATE IF TRIGLYCERIDE OVER 400 mg/dL  TRIG 536*  CHOLHDL 6.0  LDLDIRECT 158.8*   Thyroid Function Tests: Recent Labs    10/07/19 1412 10/08/19 0800 10/09/19 0256  TSH 0.010*  --   --   FREET4  --   --  2.06*  T3FREE  --  8.2*  --    Anemia Panel: Recent Labs    10/07/19 1412 10/09/19 0256  VITAMINB12  --  253  TIBC 577*  --   IRON 21*  --    Sepsis Labs: No results for input(s): PROCALCITON, LATICACIDVEN in the last 168 hours.  Recent Results (from the past 240 hour(s))   SARS Coronavirus 2 by RT PCR (hospital order, performed in [facility] hospital lab) Nasopharyngeal Nasopharyngeal Swab     Status: None   Collection Time: 10/07/19 10:41 AM   Specimen: Nasopharyngeal Swab  Result Value Ref Range Status   SARS Coronavirus 2 NEGATIVE NEGATIVE Final    Comment: (NOTE) SARS-CoV-2 target nucleic acids are NOT DETECTED.  The SARS-CoV-2 RNA is generally detectable in upper and lower respiratory specimens during the acute phase of infection. The lowest concentration of SARS-CoV-2 viral copies this assay can detect is 250 copies / mL. A negative result does not preclude SARS-CoV-2 infection and should not be  used as the sole basis for treatment or other patient management decisions.  A negative result may occur with improper specimen collection / handling, submission of specimen other than nasopharyngeal swab, presence of viral mutation(s) within the areas targeted by this assay, and inadequate number of viral copies (<250 copies / mL). A negative result must be combined with clinical observations, patient history, and epidemiological information.  Fact Sheet for Patients:   BoilerBrush.com.cy  Fact Sheet for Healthcare Providers: https://pope.com/  This test is not yet approved or  cleared by the [place] FDA and has been authorized for detection and/or diagnosis of SARS-CoV-2 by FDA under an Emergency Use Authorization (EUA).  This EUA will remain in effect (meaning this test can be used) for the duration of the COVID-19 declaration under Section 564(b)(1) of the Act, 21 U.S.C. section 360bbb-3(b)(1), unless the authorization is terminated or revoked sooner.  Performed at [facility], [address] [place], [place] [address]          Radiology Studies: No results found.      Scheduled Meds:  aspirin  81 mg Oral Daily   atorvastatin  40 mg Oral Daily   gabapentin  200 mg  Oral BID   insulin aspart  0-15 Units Subcutaneous TID WC   insulin aspart  0-5 Units Subcutaneous QHS   insulin glargine  10 Units Subcutaneous Daily   irbesartan  150 mg Oral Daily   methimazole  20 mg Oral Daily   metoprolol tartrate  12.5 mg Oral BID   nicotine  14 mg Transdermal Daily   pantoprazole  40 mg Oral Daily   vitamin B-12  1,000 mcg Oral Daily   Continuous Infusions:   LOS: 1 day    Time spent: 35 miutes    Chelsea Higinbotham A Lorissa Kishbaugh, MD [facility]   If 7PM-7AM, please contact night-coverage [email or web address]  10/09/2019, 1:26 PM

## 2019-10-09 NOTE — Interval H&P Note (Signed)
History and Physical Interval Note: 64/female with IDA, weight loss, NSAID use, last colonoscopy was in 2012, for an EGD and Colonoscopy.  10/09/2019 8:14 AM  Chelsea Gray  has presented today for EGD and colonoscopy, with the diagnosis of iron deficiency anemia.  The various methods of treatment have been discussed with the patient and family. After consideration of risks, benefits and other options for treatment, the patient has consented to  Procedure(s): ESOPHAGOGASTRODUODENOSCOPY (EGD) WITH PROPOFOL (N/A) COLONOSCOPY WITH PROPOFOL (N/A) as a surgical intervention.  The patient's history has been reviewed, patient examined, no change in status, stable for surgery.  I have reviewed the patient's chart and labs.  Questions were answered to the patient's satisfaction.     Kerin Salen

## 2019-10-10 ENCOUNTER — Other Ambulatory Visit: Payer: Self-pay | Admitting: Physician Assistant

## 2019-10-10 DIAGNOSIS — D500 Iron deficiency anemia secondary to blood loss (chronic): Secondary | ICD-10-CM

## 2019-10-10 DIAGNOSIS — I200 Unstable angina: Secondary | ICD-10-CM

## 2019-10-10 LAB — CBC
HCT: 28.6 % — ABNORMAL LOW (ref 36.0–46.0)
Hemoglobin: 8.5 g/dL — ABNORMAL LOW (ref 12.0–15.0)
MCH: 23.2 pg — ABNORMAL LOW (ref 26.0–34.0)
MCHC: 29.7 g/dL — ABNORMAL LOW (ref 30.0–36.0)
MCV: 77.9 fL — ABNORMAL LOW (ref 80.0–100.0)
Platelets: 313 10*3/uL (ref 150–400)
RBC: 3.67 MIL/uL — ABNORMAL LOW (ref 3.87–5.11)
RDW: 17.1 % — ABNORMAL HIGH (ref 11.5–15.5)
WBC: 6.4 10*3/uL (ref 4.0–10.5)
nRBC: 0 % (ref 0.0–0.2)

## 2019-10-10 LAB — THYROTROPIN RECEPTOR AUTOABS: Thyrotropin Receptor Ab: <1.1 IU/L (ref 0.00–1.75)

## 2019-10-10 LAB — GLUCOSE, CAPILLARY
Glucose-Capillary: 292 mg/dL — ABNORMAL HIGH (ref 70–99)
Glucose-Capillary: 213 mg/dL — ABNORMAL HIGH (ref 70–99)

## 2019-10-10 MED ORDER — PANTOPRAZOLE SODIUM 40 MG PO TBEC: 40.0000 mg | DELAYED_RELEASE_TABLET | Freq: Every day | ORAL | 0 refills | Status: AC

## 2019-10-10 MED ORDER — ATORVASTATIN CALCIUM 80 MG PO TABS
80.0000 mg | ORAL_TABLET | Freq: Every day | ORAL | Status: DC
  Administered 2019-10-10: 80 mg via ORAL
  Filled 2019-10-10: qty 1

## 2019-10-10 MED ORDER — CYANOCOBALAMIN 1000 MCG PO TABS: 1000.0000 ug | ORAL_TABLET | Freq: Every day | ORAL | 0 refills | Status: DC

## 2019-10-10 MED ORDER — NICOTINE 14 MG/24HR TD PT24: 14.0000 mg | MEDICATED_PATCH | Freq: Every day | TRANSDERMAL | 0 refills | Status: DC

## 2019-10-10 MED ORDER — CANAGLIFLOZIN 300 MG PO TABS
300.0000 mg | ORAL_TABLET | Freq: Every day | ORAL | Status: DC
  Filled 2019-10-10: qty 1

## 2019-10-10 MED ORDER — METOPROLOL TARTRATE 25 MG PO TABS: 12.5000 mg | ORAL_TABLET | Freq: Two times a day (BID) | ORAL | 0 refills | Status: DC

## 2019-10-10 MED ORDER — FERROUS SULFATE 325 (65 FE) MG PO TABS: 325.0000 mg | ORAL_TABLET | Freq: Every day | ORAL | 3 refills | Status: AC

## 2019-10-10 MED ORDER — METOPROLOL TARTRATE 25 MG PO TABS: 25.0000 mg | ORAL_TABLET | Freq: Two times a day (BID) | ORAL | 0 refills | Status: DC

## 2019-10-10 MED ORDER — METHIMAZOLE 10 MG PO TABS: 20.0000 mg | ORAL_TABLET | Freq: Every day | ORAL | 3 refills | Status: AC

## 2019-10-10 MED ORDER — GABAPENTIN 100 MG PO CAPS: 200.0000 mg | ORAL_CAPSULE | Freq: Two times a day (BID) | ORAL | 0 refills | Status: DC

## 2019-10-10 MED ORDER — ASPIRIN 81 MG PO CHEW: 81.0000 mg | CHEWABLE_TABLET | Freq: Every day | ORAL | 0 refills | Status: DC

## 2019-10-10 MED ORDER — NITROGLYCERIN 0.4 MG SL SUBL: 0.4000 mg | SUBLINGUAL_TABLET | SUBLINGUAL | 3 refills | Status: AC | PRN

## 2019-10-10 NOTE — Discharge Summary (Signed)
Physician Discharge Summary  AMEN DARGIS ZOX:[id number] DOB: 24-Apr-1955 DOA: 10/07/2019  PCP: Maurice Small, MD  Admit date: 10/07/2019 Discharge date: 10/10/2019  Admitted From: Home  Disposition: Home   Recommendations for Outpatient Follow-up:  1. Follow up with PCP in 1-2 weeks 2. Please obtain BMP/CBC in one week 3. Follow up with Dr Sharl Ma for further evaluation of hyperthyroidism.  4. Follow up with cardiology for stress test.  5. Monitor Hb.  6. Follow biopsy results from endoscopy and colonoscopy 7. Continue to encourage smoking cessation.    Discharge Condition: Stable.  CODE STATUS: Full code Diet recommendation:  Carb Modified  Brief/Interim Summary: 64 year old with past medical history significant for insulin-dependent diabetes, hypertension, remote history of DVT and PE during pregnancy, presents with new onset chest pain.  Patient started on exertion during her morning walk pressure-like, 7 out of 10 associated with shortness of breath and palpitation.  She had mildly elevated D-dimer, EKG with T wave flattening.   1-Chest pain/Angina;  Evaluated by cardiology. Some of her symptoms are thought to be related to hyperthyroidism.  Chest pain-free. Reports chest pressure.Marland Kitchen  ECHO; No wall motion abnormality, ejection fraction 70% CT angio chest negative for PE. HLD; started on statins.  TSH low, Free T 3 and T 4 elevated.  Cath cancel, to avoid worsening thyroid function. Needs stress test per cardio out patient.  Nitroglycerin provided.   Hyperthyroidism; Presents with mild tremors, anxiety, 30 pounds weight lost, chest tightness.  TSH;0.01, Free T 3: 8.2, Free T 4 2.0 Discussed with endocrinologist, will start methimazole.  Follow up with Dr Sharl Ma on Wednesday at 3 pm.  thyroid US normal.  FU ; Thyrotropin receptor Ab Negative.   Anemia, iron deficiency: Patient report 30 pound weight loss in 6 months. Received  IV iron GI evaluation.  Endoscopy: Widely  patent Schatzki 's ring, 5 cm hiatal hernia. 2 gastric AVM, four AVM duodenum, all treated with APC, 1 Endo Clip placed in the duodenum.  Superficial linear antral ulcers, biopsy taken for H. Pylori. Once versus polyp status post removed with cold polypectomy, 1 Endo Clip placed.  A small internal hemorrhoid.  PPI daily for 4 weeks.   Bilateral calf pain: Dopplers negative for DVT  Diabetes type 2 hyperglycemia, uncontrolled Hb A1c 8.8 _Sliding scale insulin Resume Invokana Resume home regimen at discharge.   Hypertension; Continue with Avapro metoprolol  History of neuropathy; continue with gabapentin  Current smoker   Discharge Diagnoses:  Active Problems:   Diabetes (HCC)   HYPERCHOLESTEROLEMIA   SMOKER   Essential hypertension   Chest pain with moderate risk for cardiac etiology   Chest pain   Neuropathy   Iron deficiency anemia due to chronic blood loss    Discharge Instructions  Discharge Instructions    Diet - low sodium heart healthy   Complete by: As directed    Increase activity slowly   Complete by: As directed      Allergies as of 10/10/2019      Reactions   Codeine Sulfate Nausea And Vomiting, Other (See Comments)   dizziness   Metformin Diarrhea, Other (See Comments)   fatigue      Medication List    STOP taking these medications   hydrochlorothiazide 12.5 MG tablet Commonly known as: HYDRODIURIL   meloxicam 15 MG tablet Commonly known as: MOBIC     TAKE these medications   aspirin 81 MG chewable tablet Chew 1 tablet (81 mg total) by mouth daily. Start taking on:  October 11, 2019   atorvastatin 40 MG tablet Commonly known as: LIPITOR Take 40 mg by mouth daily.   cyanocobalamin 1000 MCG tablet Take 1 tablet (1,000 mcg total) by mouth daily. Start taking on: October 11, 2019   ferrous sulfate 325 (65 FE) MG tablet Take 1 tablet (325 mg total) by mouth daily.   FREESTYLE LITE test strip Generic drug: glucose blood CHECK BLOOD  SUGAR ONCE A DAY What changed: See the new instructions.   gabapentin 100 MG capsule Commonly known as: NEURONTIN Take 2 capsules (200 mg total) by mouth 2 (two) times daily.   Invokana 300 MG Tabs tablet Generic drug: canagliflozin TAKE 1 TABLET BY MOUTH EVERY DAY What changed:   how much to take  when to take this   metFORMIN 500 MG tablet Commonly known as: GLUCOPHAGE Take 1 tablet (500 mg total) by mouth 2 (two) times daily with a meal. What changed: when to take this   methimazole 10 MG tablet Commonly known as: TAPAZOLE Take 2 tablets (20 mg total) by mouth daily. Start taking on: October 11, 2019   metoprolol tartrate 25 MG tablet Commonly known as: LOPRESSOR Take 1 tablet (25 mg total) by mouth 2 (two) times daily.   nicotine 14 mg/24hr patch Commonly known as: NICODERM CQ - dosed in mg/24 hours Place 1 patch (14 mg total) onto the skin daily. Start taking on: October 11, 2019   nitroGLYCERIN 0.4 MG SL tablet Commonly known as: Nitrostat Place 1 tablet (0.4 mg total) under the tongue every 5 (five) minutes as needed for chest pain.   NovoTwist 32G X 5 MM Misc Generic drug: Insulin Pen Needle USE DAILY FOR INJECTION What changed: See the new instructions.   olmesartan 20 MG tablet Commonly known as: BENICAR Take 20 mg by mouth daily.   pantoprazole 40 MG tablet Commonly known as: PROTONIX Take 1 tablet (40 mg total) by mouth daily. Start taking on: October 11, 2019   pioglitazone 45 MG tablet Commonly known as: ACTOS TAKE 1 TABLET (45 MG TOTAL) BY MOUTH DAILY.   Trulicity 1.5 MG/0.5ML Sopn Generic drug: Dulaglutide INJECT 1.5 MG INTO THE SKIN ONCE A WEEK.       Follow-up Information    Talmage Coin, MD Follow up in 2 day(s).   Specialty: Endocrinology Why: You have an appoitment at 3 ;00 PM with Dr Sharl Ma on 10/11/2019. Contact information: [address] [place] [address] [phone]        Lewayne Bunting, MD Follow up.    Specialty: Cardiology Why: office will call with date and time of stress test and appointment  Contact information: [address] [place] [address] [phone]              Allergies  Allergen Reactions  . Codeine Sulfate Nausea And Vomiting and Other (See Comments)    dizziness  . Metformin Diarrhea and Other (See Comments)    fatigue    Consultations:  Cardiology    Procedures/Studies: DG Chest 2 View  Result Date: 10/07/2019 CLINICAL DATA:  Central chest pain and shortness of breath EXAM: CHEST - 2 VIEW COMPARISON:  July 02, 2011 FINDINGS: The heart size and mediastinal contours are within normal limits. Both lungs are clear. The visualized skeletal structures are unremarkable. IMPRESSION: No active cardiopulmonary disease. Electronically Signed   By: Sherian Rein M.D.   On: 10/07/2019 09:37   CT ANGIO CHEST PE W OR WO CONTRAST  Result  Date: 10/07/2019 CLINICAL DATA:  Shortness of breath.  Assess for pulmonary embolus. EXAM: CT ANGIOGRAPHY CHEST WITH CONTRAST TECHNIQUE: Multidetector CT imaging of the chest was performed using the standard protocol during bolus administration of intravenous contrast. Multiplanar CT image reconstructions and MIPs were obtained to evaluate the vascular anatomy. CONTRAST:  65mL OMNIPAQUE IOHEXOL 350 MG/ML SOLN COMPARISON:  Chest x-ray October 07, 2019 FINDINGS: Cardiovascular: Satisfactory opacification of the pulmonary arteries to the segmental level. No evidence of pulmonary embolism. The heart size is mildly enlarged. No pericardial effusion. Mediastinum/Nodes: No enlarged mediastinal, hilar, or axillary lymph nodes. Thyroid gland, trachea, and esophagus demonstrate no significant findings. Lungs/Pleura: Minimal atelectasis of posterior lung bases are noted. There is no focal pneumonia or pleural effusion. Upper Abdomen: No acute abnormality. Musculoskeletal: Minimal degenerative joint changes of thoracic spine are noted. Review of  the MIP images confirms the above findings. IMPRESSION: 1. No pulmonary embolus. 2. Minimal atelectasis of posterior lung bases. Electronically Signed   By: Sherian ReinWei-Chen  Lin M.D.   On: 10/07/2019 12:31   ECHOCARDIOGRAM COMPLETE  Result Date: 10/07/2019    ECHOCARDIOGRAM REPORT   Patient Name:   Chelsea Gray Date of Exam: 10/07/2019 Medical Rec #:  [id number]        Height:       65.0 in Accession #:    [id number]       Weight:       175.0 lb Date of Birth:  08/17/1955         BSA:          1.869 m Patient Age:    64 years         BP:           135/75 mmHg Patient Gender: F                HR:           99 bpm. Exam Location:  Inpatient Procedure: 2D Echo, Cardiac Doppler and Color Doppler Indications:    Chest pain  History:        Patient has no prior history of Echocardiogram examinations.                 Signs/Symptoms:Chest Pain; Risk Factors:Diabetes, Hypertension                 and Dyslipidemia. Pul.emboli.  Sonographer:    Lavenia AtlasBrooke Strickland Referring Phys: [id number] PING T ZHANG IMPRESSIONS  1. Left ventricular ejection fraction, by estimation, is 65 to 70%. The left ventricle has normal function. The left ventricle has no regional wall motion abnormalities. Left ventricular diastolic parameters are indeterminate.  2. Right ventricular systolic function is normal. The right ventricular size is normal. There is normal pulmonary artery systolic pressure. The estimated right ventricular systolic pressure is 24.5 mmHg.  3. The mitral valve is grossly normal. Trivial mitral valve regurgitation.  4. The aortic valve is tricuspid. Aortic valve regurgitation is not visualized.  5. The inferior vena cava is normal in size with greater than 50% respiratory variability, suggesting right atrial pressure of 3 mmHg. FINDINGS  Left Ventricle: Left ventricular ejection fraction, by estimation, is 65 to 70%. The left ventricle has normal function. The left ventricle has no regional wall motion abnormalities. The left  ventricular internal cavity size was normal in size. There is  borderline left ventricular hypertrophy. Left ventricular diastolic parameters are indeterminate. Right Ventricle: The right ventricular size is normal. No increase in right ventricular wall thickness. Right ventricular  systolic function is normal. There is normal pulmonary artery systolic pressure. The tricuspid regurgitant velocity is 2.32 m/s, and  with an assumed right atrial pressure of 3 mmHg, the estimated right ventricular systolic pressure is 24.5 mmHg. Left Atrium: Left atrial size was normal in size. Right Atrium: Right atrial size was normal in size. Pericardium: There is no evidence of pericardial effusion. Mitral Valve: The mitral valve is grossly normal. Trivial mitral valve regurgitation. Tricuspid Valve: The tricuspid valve is grossly normal. Tricuspid valve regurgitation is trivial. Aortic Valve: The aortic valve is tricuspid. Aortic valve regurgitation is not visualized. Mild aortic valve annular calcification. Pulmonic Valve: The pulmonic valve was grossly normal. Pulmonic valve regurgitation is trivial. Aorta: The aortic root is normal in size and structure. Venous: The inferior vena cava is normal in size with greater than 50% respiratory variability, suggesting right atrial pressure of 3 mmHg. IAS/Shunts: No atrial level shunt detected by color flow Doppler.  LEFT VENTRICLE PLAX 2D LVIDd:         4.20 cm  Diastology LVIDs:         2.40 cm  LV e' lateral:   9.68 cm/s LV PW:         1.00 cm  LV E/e' lateral: 6.2 LV IVS:        1.00 cm  LV e' medial:    5.98 cm/s LVOT diam:     1.80 cm  LV E/e' medial:  10.1 LV SV:         58 LV SV Index:   31 LVOT Area:     2.54 cm  RIGHT VENTRICLE RV Basal diam:  2.80 cm RV S prime:     12.30 cm/s TAPSE (M-mode): 2.3 cm LEFT ATRIUM             Index       RIGHT ATRIUM          Index LA diam:        3.70 cm 1.98 cm/m  RA Area:     9.66 cm LA Vol (A2C):   38.2 ml 20.44 ml/m RA Volume:   18.50 ml  9.90 ml/m LA Vol (A4C):   34.8 ml 18.62 ml/m LA Biplane Vol: 36.7 ml 19.64 ml/m  AORTIC VALVE LVOT Vmax:   98.30 cm/s LVOT Vmean:  67.300 cm/s LVOT VTI:    0.229 m  AORTA Ao Root diam: 2.90 cm MITRAL VALVE                TRICUSPID VALVE MV Area (PHT): 3.99 cm     TR Peak grad:   21.5 mmHg MV Decel Time: 190 msec     TR Vmax:        232.00 cm/s MV E velocity: 60.20 cm/s MV A velocity: 102.00 cm/s  SHUNTS MV E/A ratio:  0.59         Systemic VTI:  0.23 m                             Systemic Diam: 1.80 cm Nona Dell MD Electronically signed by Nona Dell MD Signature Date/Time: 10/07/2019/12:50:46 PM    Final    US THYROID  Result Date: 10/09/2019 CLINICAL DATA:  Hyperthyroidism EXAM: THYROID ULTRASOUND TECHNIQUE: Ultrasound examination of the thyroid gland and adjacent soft tissues was performed. COMPARISON:  None. FINDINGS: Parenchymal Echotexture: Normal Isthmus: 4.4 mm Right lobe: 4.9 x 1.4 x 2.1 cm Left lobe: 4.5 x 1.0  x 1.3 cm _________________________________________________________ Estimated total number of nodules >/= 1 cm: 0 Number of spongiform nodules >/=  2 cm not described below (TR1): 0 Number of mixed cystic and solid nodules >/= 1.5 cm not described below (TR2): 0 _________________________________________________________ No discrete nodules are seen within the thyroid gland. IMPRESSION: Normal thyroid ultrasound for age The above is in keeping with the ACR TI-RADS recommendations - J Am Coll Radiol 2017;14:587-595. Electronically Signed   By: Judie Petit.  Shick M.D.   On: 10/09/2019 19:54   VAS [place] LOWER EXTREMITY VENOUS (DVT)  Result Date: 10/07/2019  Lower Venous DVTStudy Indications: Pain.  Comparison Study: Prior negative left lower extremity venous duplex from                   02/04/12 is available for comparison. Performing Technologist: Sherren Kerns RVS  Examination Guidelines: A complete evaluation includes B-mode imaging, spectral Doppler, color Doppler, and power Doppler as needed  of all accessible portions of each vessel. Bilateral testing is considered an integral part of a complete examination. Limited examinations for reoccurring indications may be performed as noted. The reflux portion of the exam is performed with the patient in reverse Trendelenburg.  +---------+---------------+---------+-----------+----------+--------------+ RIGHT    CompressibilityPhasicitySpontaneityPropertiesThrombus Aging +---------+---------------+---------+-----------+----------+--------------+ CFV      Full           Yes      Yes                                 +---------+---------------+---------+-----------+----------+--------------+ SFJ      Full                                                        +---------+---------------+---------+-----------+----------+--------------+ FV Prox  Full                                                        +---------+---------------+---------+-----------+----------+--------------+ FV Mid   Full                                                        +---------+---------------+---------+-----------+----------+--------------+ FV DistalFull                                                        +---------+---------------+---------+-----------+----------+--------------+ PFV      Full                                                        +---------+---------------+---------+-----------+----------+--------------+ POP      Full           Yes      Yes                                 +---------+---------------+---------+-----------+----------+--------------+  PTV      Full                                                        +---------+---------------+---------+-----------+----------+--------------+ PERO     Full                                                        +---------+---------------+---------+-----------+----------+--------------+    +---------+---------------+---------+-----------+----------+--------------+ LEFT     CompressibilityPhasicitySpontaneityPropertiesThrombus Aging +---------+---------------+---------+-----------+----------+--------------+ CFV      Full           Yes      Yes                                 +---------+---------------+---------+-----------+----------+--------------+ SFJ      Full                                                        +---------+---------------+---------+-----------+----------+--------------+ FV Prox  Full                                                        +---------+---------------+---------+-----------+----------+--------------+ FV Mid   Full                                                        +---------+---------------+---------+-----------+----------+--------------+ FV DistalFull                                                        +---------+---------------+---------+-----------+----------+--------------+ PFV      Full                                                        +---------+---------------+---------+-----------+----------+--------------+ POP      Full           Yes      Yes                                 +---------+---------------+---------+-----------+----------+--------------+ PTV      Full                                                        +---------+---------------+---------+-----------+----------+--------------+  PERO     Full                                                        +---------+---------------+---------+-----------+----------+--------------+     Summary: BILATERAL: - No evidence of deep vein thrombosis seen in the lower extremities, bilaterally. - RIGHT: - No cystic structure found in the popliteal fossa.  LEFT: - No cystic structure found in the popliteal fossa.  *See table(s) above for measurements and observations. Electronically signed by Sherald Hess MD on 10/07/2019 at 3:17:09  PM.    Final     Subjective: Alert, denies chest pressure. She is feeling better.   Discharge Exam: Vitals:   10/10/19 0755 10/10/19 1137  BP: 121/61 134/74  Pulse: 76 70  Resp: 18   Temp: 97.7 F (36.5 C) 97.8 F (36.6 C)  SpO2: 99% 100%     General: Pt is alert, awake, not in acute distress Cardiovascular: RRR, S1/S2 +, no rubs, no gallops Respiratory: CTA bilaterally, no wheezing, no rhonchi Abdominal: Soft, NT, ND, bowel sounds + Extremities: no edema, no cyanosis    The results of significant diagnostics from this hospitalization (including imaging, microbiology, ancillary and laboratory) are listed below for reference.     Microbiology: Recent Results (from the past 240 hour(s))  SARS Coronavirus 2 by RT PCR (hospital order, performed in [facility] hospital lab) Nasopharyngeal Nasopharyngeal Swab     Status: None   Collection Time: 10/07/19 10:41 AM   Specimen: Nasopharyngeal Swab  Result Value Ref Range Status   SARS Coronavirus 2 NEGATIVE NEGATIVE Final    Comment: (NOTE) SARS-CoV-2 target nucleic acids are NOT DETECTED.  The SARS-CoV-2 RNA is generally detectable in upper and lower respiratory specimens during the acute phase of infection. The lowest concentration of SARS-CoV-2 viral copies this assay can detect is 250 copies / mL. A negative result does not preclude SARS-CoV-2 infection and should not be used as the sole basis for treatment or other patient management decisions.  A negative result may occur with improper specimen collection / handling, submission of specimen other than nasopharyngeal swab, presence of viral mutation(s) within the areas targeted by this assay, and inadequate number of viral copies (<250 copies / mL). A negative result must be combined with clinical observations, patient history, and epidemiological information.  Fact Sheet for Patients:   BoilerBrush.com.cy  Fact Sheet for Healthcare  Providers: https://pope.com/  This test is not yet approved or  cleared by the [place] FDA and has been authorized for detection and/or diagnosis of SARS-CoV-2 by FDA under an Emergency Use Authorization (EUA).  This EUA will remain in effect (meaning this test can be used) for the duration of the COVID-19 declaration under Section 564(b)(1) of the Act, 21 U.S.C. section 360bbb-3(b)(1), unless the authorization is terminated or revoked sooner.  Performed at [facility], [address], [place], [place] [address]      Labs: BNP (last 3 results) No results for input(s): BNP in the last 8760 hours. Basic Metabolic Panel: Recent Labs  Lab 10/07/19 0846 10/08/19 0306 10/09/19 0256  NA 135 136 136  K 3.5 4.5 4.6  CL 101 102 104  CO2 19* 25 23  GLUCOSE 455* 309* 226*  BUN 33* 27* 21  CREATININE 1.12* 0.83 0.68  CALCIUM 9.6 9.3 9.3  Liver Function Tests: No results for input(s): AST, ALT, ALKPHOS, BILITOT, PROT, ALBUMIN in the last 168 hours. No results for input(s): LIPASE, AMYLASE in the last 168 hours. No results for input(s): AMMONIA in the last 168 hours. CBC: Recent Labs  Lab 10/07/19 0846 10/08/19 0306 10/09/19 0256 10/10/19 0340  WBC 7.3 7.5 6.4 6.4  HGB 9.8* 8.6* 8.9* 8.5*  HCT 33.2* 28.9* 30.1* 28.6*  MCV 77.6* 76.5* 76.4* 77.9*  PLT 413* 349 332 313   Cardiac Enzymes: No results for input(s): CKTOTAL, CKMB, CKMBINDEX, TROPONINI in the last 168 hours. BNP: Invalid input(s): POCBNP CBG: Recent Labs  Lab 10/09/19 1110 10/09/19 1704 10/09/19 2111 10/10/19 0751 10/10/19 1131  GLUCAP 183* 329* 255* 292* 213*   D-Dimer No results for input(s): DDIMER in the last 72 hours. Hgb A1c Recent Labs    10/07/19 1412  HGBA1C 8.8*   Lipid Profile Recent Labs    10/07/19 1412  CHOL 299*  HDL 50  LDLCALC UNABLE TO CALCULATE IF TRIGLYCERIDE OVER 400 mg/dL  TRIG 185*  CHOLHDL 6.0  LDLDIRECT 158.8*   Thyroid  function studies Recent Labs    10/07/19 1412 10/08/19 0800  TSH 0.010*  --   T3FREE  --  8.2*   Anemia work up Recent Labs    10/07/19 1412 10/09/19 0256  VITAMINB12  --  253  TIBC 577*  --   IRON 21*  --    Urinalysis    Component Value Date/Time   COLORURINE AMBER (A) 02/16/2016 1621   APPEARANCEUR TURBID (A) 02/16/2016 1621   LABSPEC 1.036 (H) 02/16/2016 1621   PHURINE 8.0 02/16/2016 1621   GLUCOSEU >1000 (A) 02/16/2016 1621   GLUCOSEU NEGATIVE 12/24/2010 0931   HGBUR NEGATIVE 02/16/2016 1621   BILIRUBINUR NEGATIVE 02/16/2016 1621   KETONESUR NEGATIVE 02/16/2016 1621   PROTEINUR 30 (A) 02/16/2016 1621   UROBILINOGEN 0.2 07/02/2011 1355   NITRITE NEGATIVE 02/16/2016 1621   LEUKOCYTESUR NEGATIVE 02/16/2016 1621   Sepsis Labs Invalid input(s): PROCALCITONIN,  WBC,  LACTICIDVEN Microbiology Recent Results (from the past 240 hour(s))  SARS Coronavirus 2 by RT PCR (hospital order, performed in [facility] hospital lab) Nasopharyngeal Nasopharyngeal Swab     Status: None   Collection Time: 10/07/19 10:41 AM   Specimen: Nasopharyngeal Swab  Result Value Ref Range Status   SARS Coronavirus 2 NEGATIVE NEGATIVE Final    Comment: (NOTE) SARS-CoV-2 target nucleic acids are NOT DETECTED.  The SARS-CoV-2 RNA is generally detectable in upper and lower respiratory specimens during the acute phase of infection. The lowest concentration of SARS-CoV-2 viral copies this assay can detect is 250 copies / mL. A negative result does not preclude SARS-CoV-2 infection and should not be used as the sole basis for treatment or other patient management decisions.  A negative result may occur with improper specimen collection / handling, submission of specimen other than nasopharyngeal swab, presence of viral mutation(s) within the areas targeted by this assay, and inadequate number of viral copies (<250 copies / mL). A negative result must be combined with clinical observations, patient  history, and epidemiological information.  Fact Sheet for Patients:   BoilerBrush.com.cy  Fact Sheet for Healthcare Providers: https://pope.com/  This test is not yet approved or  cleared by the [place] FDA and has been authorized for detection and/or diagnosis of SARS-CoV-2 by FDA under an Emergency Use Authorization (EUA).  This EUA will remain in effect (meaning this test can be used) for the duration of the COVID-19 declaration  under Section 564(b)(1) of the Act, 21 U.S.C. section 360bbb-3(b)(1), unless the authorization is terminated or revoked sooner.  Performed at [facility], [place] [address], [place], [place] [address]      Time coordinating discharge: 40 minutes  SIGNED:   Elmarie Shiley, MD  [facility]

## 2019-10-10 NOTE — Progress Notes (Signed)
Progress Note  Patient Name: Chelsea Gray Date of Encounter: 10/10/2019  [facility] Cardiologist: Olga Millers, MD   Subjective   Feeling well. No chest pain, sob or palpitations.   Inpatient Medications    Scheduled Meds:  aspirin  81 mg Oral Daily   atorvastatin  80 mg Oral Daily   canagliflozin  300 mg Oral Daily   gabapentin  200 mg Oral BID   insulin aspart  0-15 Units Subcutaneous TID WC   insulin aspart  0-5 Units Subcutaneous QHS   insulin glargine  10 Units Subcutaneous Daily   irbesartan  150 mg Oral Daily   methimazole  20 mg Oral Daily   metoprolol tartrate  12.5 mg Oral BID   nicotine  14 mg Transdermal Daily   pantoprazole  40 mg Oral Daily   vitamin B-12  1,000 mcg Oral Daily   Continuous Infusions:  PRN Meds: acetaminophen **OR** acetaminophen, ondansetron (ZOFRAN) IV, traMADol   Vital Signs    Vitals:   10/09/19 1300 10/09/19 2112 10/10/19 0509 10/10/19 0755  BP: 131/70 130/66 126/72 121/61  Pulse: 79 77 73 76  Resp: 18 16 16    Temp: 98.2 F (36.8 C) 98.5 F (36.9 C) 98.3 F (36.8 C) 97.7 F (36.5 C)  TempSrc: Oral Oral Oral Oral  SpO2: 98% 100% 100% 99%  Weight:   76.2 kg   Height:        Intake/Output Summary (Last 24 hours) at 10/10/2019 0802 Last data filed at 10/09/2019 2130 Gross per 24 hour  Intake 1360 ml  Output --  Net 1360 ml   Last 3 Weights 10/10/2019 10/09/2019 10/08/2019  Weight (lbs) 167 lb 14.4 oz 166 lb 6.4 oz 167 lb 1.6 oz  Weight (kg) 76.159 kg 75.479 kg 75.796 kg      Telemetry    NSR - Personally Reviewed  ECG    N/A  Physical Exam   GEN: No acute distress.   Neck: No JVD Cardiac: RRR, no murmurs, rubs, or gallops.  Respiratory: Clear to auscultation bilaterally. GI: Soft, nontender, non-distended  MS: No edema; No deformity. Neuro:  Nonfocal  Psych: Normal affect   Labs    High Sensitivity Troponin:   Recent Labs  Lab 10/07/19 0846 10/07/19 1027 10/07/19 2047  TROPONINIHS  13 19* 21*      Chemistry Recent Labs  Lab 10/07/19 0846 10/08/19 0306 10/09/19 0256  NA 135 136 136  K 3.5 4.5 4.6  CL 101 102 104  CO2 19* 25 23  GLUCOSE 455* 309* 226*  BUN 33* 27* 21  CREATININE 1.12* 0.83 0.68  CALCIUM 9.6 9.3 9.3  GFRNONAA 52* >60 >60  GFRAA 60* >60 >60  ANIONGAP 15 9 9      Hematology Recent Labs  Lab 10/08/19 0306 10/09/19 0256 10/10/19 0340  WBC 7.5 6.4 6.4  RBC 3.78* 3.94 3.67*  HGB 8.6* 8.9* 8.5*  HCT 28.9* 30.1* 28.6*  MCV 76.5* 76.4* 77.9*  MCH 22.8* 22.6* 23.2*  MCHC 29.8* 29.6* 29.7*  RDW 17.0* 16.8* 17.1*  PLT 349 332 313   DDimer  Recent Labs  Lab 10/07/19 0945  DDIMER 0.51*     Radiology    12/11/19 THYROID  Result Date: 10/09/2019 CLINICAL DATA:  Hyperthyroidism EXAM: THYROID ULTRASOUND TECHNIQUE: Ultrasound examination of the thyroid gland and adjacent soft tissues was performed. COMPARISON:  None. FINDINGS: Parenchymal Echotexture: Normal Isthmus: 4.4 mm Right lobe: 4.9 x 1.4 x 2.1 cm Left lobe: 4.5 x 1.0 x  1.3 cm _________________________________________________________ Estimated total number of nodules >/= 1 cm: 0 Number of spongiform nodules >/=  2 cm not described below (TR1): 0 Number of mixed cystic and solid nodules >/= 1.5 cm not described below (TR2): 0 _________________________________________________________ No discrete nodules are seen within the thyroid gland. IMPRESSION: Normal thyroid ultrasound for age The above is in keeping with the ACR TI-RADS recommendations - J Am Coll Radiol 2017;14:587-595. Electronically Signed   By: Judie Petit.  Shick M.D.   On: 10/09/2019 19:54    Cardiac Studies   Echo 10/07/2019 1. Left ventricular ejection fraction, by estimation, is 65 to 70%. The  left ventricle has normal function. The left ventricle has no regional  wall motion abnormalities. Left ventricular diastolic parameters are  indeterminate.  2. Right ventricular systolic function is normal. The right ventricular  size is normal.  There is normal pulmonary artery systolic pressure. The  estimated right ventricular systolic pressure is 24.5 mmHg.  3. The mitral valve is grossly normal. Trivial mitral valve  regurgitation.  4. The aortic valve is tricuspid. Aortic valve regurgitation is not  visualized.  5. The inferior vena cava is normal in size with greater than 50%  respiratory variability, suggesting right atrial pressure of 3 mmHg.    Patient Profile     64 y.o. female with a hx of DM-2, HTN, hx of DVT and PE during preg and tobacco abuse seen for unstable angina in setting of anemia and hyperthyroidism.   Assessment & Plan    1. Unstable angina - Initial plan was for cardiac catheterization however had drop in hemoglobin 2nd to microcytic anemia. Held heparin. Underwent EGD/Colonoscopy  "Findings: Widely patent Schatzki's ring, 5 cm hiatal hernia. 2 gastric AVMs, for duodenal AVMs, all treated with APC, 1 Endo Clip placed in duodenum. Superficial linear antral ulcers, biopsies taken for H. Pylori.  Transverse polyp, removed with cold polypectomy, 1 Endo Clip placed. Small internal hemorrhoids. Undigested food and stool noted in colon, fair visualization.  Recommendations: PPI once a day for 4 weeks. Biopsy for H. pylori and pathology on polyp can be followed as an outpatient. Resume regular diet".  - Echo showed preserved LVEF. Likely stress test this admission. Will review with MD.  - Continue ASA, statin and BB  2. Iron deficiency anemia - As above  3. Hyperthyroidism - Per primary team.   4. DM - Per primary team  5. HTN - Continue current medications - BP stable  6. Tobacco abuse - Cessation advised  7. Hypertriglyceridemia - 10/07/2019: Cholesterol 299; HDL 50; LDL Cholesterol UNABLE TO CALCULATE IF TRIGLYCERIDE OVER 400 mg/dL; Triglycerides 447; VLDL UNABLE TO CALCULATE IF TRIGLYCERIDE OVER 400 mg/dL  - On Lipitor 40mg  qd - Consider Vacepa     She already eaten breakfast.  Has follow up with endocrinologist tomorrow. She will need nitro rx if discharged today. Dr. to see.    For questions or updates, please contact [facility] Please consult [email or web address] for contact info under        Signed[place], PA  10/10/2019, 8:02 AM

## 2019-10-11 LAB — SURGICAL PATHOLOGY

## 2019-10-17 ENCOUNTER — Telehealth (HOSPITAL_COMMUNITY): Payer: Self-pay

## 2019-10-17 NOTE — Telephone Encounter (Signed)
Encounter complete. 

## 2019-10-19 ENCOUNTER — Ambulatory Visit (HOSPITAL_COMMUNITY)
Admission: RE | Admit: 2019-10-19 | Discharge: 2019-10-19 | Disposition: A | Discharge status: Home / Self Care | Payer: Medicare Other | Source: Ambulatory Visit | Attending: Cardiovascular Disease | Admitting: Cardiovascular Disease

## 2019-10-19 ENCOUNTER — Other Ambulatory Visit: Payer: Self-pay

## 2019-10-19 DIAGNOSIS — I200 Unstable angina: Secondary | ICD-10-CM | POA: Insufficient documentation

## 2019-10-19 LAB — MYOCARDIAL PERFUSION IMAGING
LV dias vol: 62 mL (ref 46–106)
LV sys vol: 15 mL
Peak HR: 85 {beats}/min
Rest HR: 56 {beats}/min
SDS: 0
SRS: 0
SSS: 0
TID: 0.91

## 2019-10-19 MED ORDER — REGADENOSON 0.4 MG/5ML IV SOLN
0.4000 mg | Freq: Once | INTRAVENOUS | Status: AC
  Administered 2019-10-19: 0.4 mg via INTRAVENOUS

## 2019-10-19 MED ORDER — TECHNETIUM TC 99M TETROFOSMIN IV KIT
9.4000 | PACK | Freq: Once | INTRAVENOUS | Status: AC | PRN
  Administered 2019-10-19: 9.4 via INTRAVENOUS
  Filled 2019-10-19: qty 10

## 2019-10-19 MED ORDER — TECHNETIUM TC 99M TETROFOSMIN IV KIT
31.5000 | PACK | Freq: Once | INTRAVENOUS | Status: AC | PRN
  Administered 2019-10-19: 31.5 via INTRAVENOUS
  Filled 2019-10-19: qty 32

## 2019-11-07 ENCOUNTER — Other Ambulatory Visit: Payer: Self-pay

## 2019-11-07 ENCOUNTER — Ambulatory Visit (INDEPENDENT_AMBULATORY_CARE_PROVIDER_SITE_OTHER): Payer: Medicare Other | Admitting: Cardiology

## 2019-11-07 ENCOUNTER — Encounter: Payer: Self-pay | Admitting: Cardiology

## 2019-11-07 VITALS — BP 154/70 | HR 75 | Ht 65.0 in | Wt 172.8 lb

## 2019-11-07 DIAGNOSIS — R079 Chest pain, unspecified: Secondary | ICD-10-CM

## 2019-11-07 DIAGNOSIS — I200 Unstable angina: Secondary | ICD-10-CM

## 2019-11-07 DIAGNOSIS — D500 Iron deficiency anemia secondary to blood loss (chronic): Secondary | ICD-10-CM | POA: Diagnosis not present

## 2019-11-07 DIAGNOSIS — E785 Hyperlipidemia, unspecified: Secondary | ICD-10-CM | POA: Insufficient documentation

## 2019-11-07 DIAGNOSIS — E0590 Thyrotoxicosis, unspecified without thyrotoxic crisis or storm: Secondary | ICD-10-CM | POA: Diagnosis not present

## 2019-11-07 DIAGNOSIS — E119 Type 2 diabetes mellitus without complications: Secondary | ICD-10-CM

## 2019-11-07 DIAGNOSIS — I10 Essential (primary) hypertension: Secondary | ICD-10-CM

## 2019-11-07 NOTE — Assessment & Plan Note (Signed)
Followed by Dr. Ellison. 

## 2019-11-07 NOTE — Assessment & Plan Note (Signed)
Discharged on statin Rx- LDL was 158 at discharge

## 2019-11-07 NOTE — Assessment & Plan Note (Signed)
GI work up- gastric and colonic AVMs-treated- as well as superficial PUD Hgb up to 10.7 on 10/24/2019

## 2019-11-07 NOTE — Progress Notes (Signed)
Cardiology Office Note:    Date:  11/07/2019   ID:  Chelsea Gray, DOB [birth year], MRN [id number]  PCP:  Maurice Small, MD  Cardiologist:  Olga Millers, MD  Electrophysiologist:  None   Referring MD: Maurice Small, MD   No chief complaint on file. post hospital  History of Present Illness:    Chelsea Gray is a pleasant 64 y.o. female with a hx of non-insulin-dependent diabetes, hypertension, dyslipidemia, smoking, and remote PE and DVT during her pregnancy.  Patient was admitted 10/07/2019.  She was out walking with her sister and became suddenly exhausted with tachycardia and shortness of breath.  In the emergency room she was in sinus rhythm.  She was noted to be anemic.  Troponins were also elevated with a peak of 21.  In addition to the above it was also noted that her TSH was low at 0.01.  Her free T4 was 2.06.  The patient was admitted and GI work-up was undertaken.  She was found to have gastric and colonic AVMs which were treated.  She also had superficial peptic ulcer disease.  Her hemoglobin did not drop below 8.  Initially diagnostic catheterization was considered but because of her anemia and hyperthyroidism was elected to forego this and obtain an outpatient Myoview.  This was done 10/19/2019 and was negative for ischemia.  Her echocardiogram shows normal LV function with an EF of 65 to 70%.  The patient is in the office today for follow-up.  She has been followed by Dr. Sharl Ma for hyperthyroidism.  When she went to see him she complained of palpitations and he added atenolol.  I reviewed the patient's discharge medications with her.  She was placed on metoprolol 25 mg twice daily although she admits she was not clear on this, she later thinks she is taking half of a 50 mg tablet twice a day.  She was also d discharged on Actos and Benicar 20 mg but tells me she has not been taking the Actos and she is taking Benicar 40 mg instead of 20 mg.  She has had one episode of  palpitations that lasted about 20 minutes since she saw Dr. Sharl Ma.  She is not had chest pain.  Past Medical History:  Diagnosis Date  . Blood transfusion    d/t hemmorrhage post c- section  . DEPRESSION 01/24/2009  . DM 01/24/2009  . 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 History of one miscarriage   . HYPERCHOLESTEROLEMIA 01/24/2009  . HYPERTENSION 01/24/2009  . Osteoarthritis   . Pulmonary embolism (HCC)    result of blood transfusion reaction 18 years ago after childbirth    Past Surgical History:  Procedure Laterality Date  . BIOPSY  10/09/2019   Procedure: BIOPSY;  Surgeon: Kerin Salen, MD;  Location: [facility] ENDOSCOPY;  Service: Gastroenterology;;  . BREAST CYST EXCISION    . BREAST REDUCTION SURGERY    . CERVICAL CERCLAGE    . CESAREAN SECTION    . COLONOSCOPY WITH PROPOFOL N/A 10/09/2019   Procedure: COLONOSCOPY WITH PROPOFOL;  Surgeon: Kerin Salen, MD;  Location: [facility] ENDOSCOPY;  Service: Gastroenterology;  Laterality: N/A;  . ESOPHAGOGASTRODUODENOSCOPY (EGD) WITH PROPOFOL N/A 10/09/2019   Procedure: ESOPHAGOGASTRODUODENOSCOPY (EGD) WITH PROPOFOL;  Surgeon: Kerin Salen, MD;  Location: [facility] ENDOSCOPY;  Service: Gastroenterology;  Laterality: N/A;  . HEMOSTASIS CLIP PLACEMENT  10/09/2019   Procedure: HEMOSTASIS CLIP PLACEMENT;  Surgeon: Kerin Salen, MD;  Location: [facility] ENDOSCOPY;  Service: Gastroenterology;;  . HOT HEMOSTASIS N/A 10/09/2019   Procedure: HOT HEMOSTASIS (ARGON PLASMA COAGULATION/BICAP);  Surgeon: Kerin Salen, MD;  Location: [facility] ENDOSCOPY;  Service: Gastroenterology;  Laterality: N/A;  . JOINT REPLACEMENT    . KNEE ARTHROPLASTY  07/14/2011   Procedure: COMPUTER ASSISTED TOTAL KNEE ARTHROPLASTY;  Surgeon: Cammy Copa, MD;  Location: [facility] OR;  Service: Orthopedics;  Laterality: Left;  Left total knee arthroplasty  . POLYPECTOMY  10/09/2019   Procedure: POLYPECTOMY;  Surgeon: Kerin Salen, MD;  Location: [facility] ENDOSCOPY;  Service:  Gastroenterology;;  . REDUCTION MAMMAPLASTY Bilateral     Current Medications: Current Meds  Medication Sig  . aspirin 81 MG chewable tablet Chew 1 tablet (81 mg total) by mouth daily.  Marland Kitchen atorvastatin (LIPITOR) 40 MG tablet Take 40 mg by mouth daily.  . ferrous sulfate 325 (65 FE) MG tablet Take 1 tablet (325 mg total) by mouth daily.  Marland Kitchen FREESTYLE LITE test strip CHECK BLOOD SUGAR ONCE A DAY (Patient taking differently: 1 each by Other route daily. )  . gabapentin (NEURONTIN) 100 MG capsule Take 2 capsules (200 mg total) by mouth 2 (two) times daily.  . INVOKANA 300 MG TABS tablet TAKE 1 TABLET BY MOUTH EVERY DAY (Patient taking differently: Take 300 mg by mouth daily before breakfast. )  . metFORMIN (GLUCOPHAGE) 500 MG tablet Take 1 tablet (500 mg total) by mouth 2 (two) times daily with a meal. (Patient taking differently: Take 500 mg by mouth daily with breakfast. )  . methimazole (TAPAZOLE) 10 MG tablet Take 2 tablets (20 mg total) by mouth daily.  . metoprolol tartrate (LOPRESSOR) 25 MG tablet Take 1 tablet (25 mg total) by mouth 2 (two) times daily.  . nicotine (NICODERM CQ - DOSED IN MG/24 HOURS) 14 mg/24hr patch Place 1 patch (14 mg total) onto the skin daily.  . nitroGLYCERIN (NITROSTAT) 0.4 MG SL tablet Place 1 tablet (0.4 mg total) under the tongue every 5 (five) minutes as needed for chest pain.  Marland Kitchen NOVOTWIST 32G X 5 MM MISC USE DAILY FOR INJECTION (Patient taking differently: 1 each by Other route daily. )  . olmesartan (BENICAR) 40 MG tablet Take 40 mg by mouth daily.   . pantoprazole (PROTONIX) 40 MG tablet Take 1 tablet (40 mg total) by mouth daily.  . TRULICITY 1.5 MG/0.5ML SOPN INJECT 1.5 MG INTO THE SKIN ONCE A WEEK.  . vitamin B-12 1000 MCG tablet Take 1 tablet (1,000 mcg total) by mouth daily.  . [DISCONTINUED] atenolol (TENORMIN) 25 MG tablet Take by mouth daily.  . [DISCONTINUED] pioglitazone (ACTOS) 45 MG tablet TAKE 1 TABLET (45 MG TOTAL) BY MOUTH DAILY.      Allergies:   Codeine sulfate and Metformin   Social History   Socioeconomic History  . Marital status: Married    Spouse name: Not on file  . Number of children: Not on file  . Years of education: Not on file  . Highest education level: Not on file  Occupational History  . Occupation: Neurosurgeon: [employer]  Tobacco Use  . Smoking status: Current Every Day Smoker    Packs/day: 0.10    Years: 6.00    Pack years: 0.60  . Smokeless tobacco: Never Used  Substance and Sexual Activity  . Alcohol use: Yes    Comment: once drink every few months  . Drug use: No  . Sexual activity: Not Currently  Other Topics Concern  . Not on file  Social  History Narrative  . Not on file   Social Determinants of Health   Financial Resource Strain:   . Difficulty of Paying Living Expenses:   Food Insecurity:   . Worried About Programme researcher, broadcasting/film/videounning Out of Food in the Last Year:   . Baristaan Out of Food in the Last Year:   Transportation Needs:   . Freight forwarderLack of Transportation (Medical):   Marland Kitchen. Lack of Transportation (Non-Medical):   Physical Activity:   . Days of Exercise per Week:   . Minutes of Exercise per Session:   Stress:   . Feeling of Stress :   Social Connections:   . Frequency of Communication with Friends and Family:   . Frequency of Social Gatherings with Friends and Family:   . Attends Religious Services:   . Active Member of Clubs or Organizations:   . Attends BankerClub or Organization Meetings:   Marland Kitchen. Marital Status:      Family History: The patient's family history includes Arrhythmia in her father; Breast cancer in her sister; Diabetes in her father, mother, and sister; Heart disease in her father; Hypertension in her mother. There is no history of Anesthesia problems.  ROS:   Please see the history of present illness.     All other systems reviewed and are negative.  EKGs/Labs/Other Studies Reviewed:    The following studies were reviewed today: Myoview 10/19/2019- Myocardial  perfusion is normal.   The study is normal.   This is a low risk study.  Overall left ventricular systolic function was normal.    LV cavity size is normal.  Nuclear stress EF:  75%.  The left ventricular ejection fraction is hyperdynamic (>65%).    Echo 10/07/2019- IMPRESSIONS    1. Left ventricular ejection fraction, by estimation, is 65 to 70%. The  left ventricle has normal function. The left ventricle has no regional  wall motion abnormalities. Left ventricular diastolic parameters are  indeterminate.  2. Right ventricular systolic function is normal. The right ventricular  size is normal. There is normal pulmonary artery systolic pressure. The  estimated right ventricular systolic pressure is 24.5 mmHg.  3. The mitral valve is grossly normal. Trivial mitral valve  regurgitation.  4. The aortic valve is tricuspid. Aortic valve regurgitation is not  visualized.  5. The inferior vena cava is normal in size with greater than 50%  respiratory variability, suggesting right atrial pressure of 3 mmHg.    EKG:  EKG is not 2ordered today.  The ekg ordered 10/13/2019 demonstrates NSR- HR 98  Recent Labs: 10/07/2019: TSH 0.010 10/09/2019: BUN 21; Creatinine, Ser 0.68; Potassium 4.6; Sodium 136 10/10/2019: Hemoglobin 8.5; Platelets 313  Recent Lipid Panel    Component Value Date/Time   CHOL 299 (H) 10/07/2019 1412   TRIG 447 (H) 10/07/2019 1412   HDL 50 10/07/2019 1412   CHOLHDL 6.0 10/07/2019 1412   VLDL UNABLE TO CALCULATE IF TRIGLYCERIDE OVER 400 mg/dL 16/10/960407/06/2019 54091412   LDLCALC UNABLE TO CALCULATE IF TRIGLYCERIDE OVER 400 mg/dL 81/19/147807/06/2019 29561412   LDLDIRECT 158.8 (H) 10/07/2019 1412    Physical Exam:    VS:  BP (!) 154/70   Pulse 75   Ht 5\' 5"  (1.651 m)   Wt 172 lb 12.8 oz (78.4 kg)   SpO2 99%   BMI 28.76 kg/m     Wt Readings from Last 3 Encounters:  11/07/19 172 lb 12.8 oz (78.4 kg)  10/19/19 167 lb (75.8 kg)  10/10/19 167 lb 14.4 oz (76.2 kg)  GEN:  Well nourished,  well developed in no acute distress HEENT: Normal NECK: No JVD; No carotid bruits CARDIAC: RRR, soft systolic ejection murmur AOV, no rubs, gallops RESPIRATORY:  Clear to auscultation without rales, wheezing or rhonchi  ABDOMEN: Soft, non-tender, non-distended MUSCULOSKELETAL:  No edema; No deformity  SKIN: Warm and dry NEUROLOGIC:  Alert and oriented x 3 PSYCHIATRIC:  Normal affect   ASSESSMENT:    Chest pain with moderate risk for cardiac etiology Admitted 10/07/2019 with chest pain and elevated Troponin-peak 21. Pt also noted to have anemia and hyperthyroid- not cathed- OP Myoview negative. Suspect this was demand ischemia secondary to her anemia and hyperthyroidism  Iron deficiency anemia due to chronic blood loss GI work up- gastric and colonic AVMs-treated- as well as superficial PUD Hgb up to 10.7 on 10/24/2019  Hyperthyroidism TSH 0.010, T4 2.06 on adm 10/07/2019 Pt is on Tapazole- followed by Dr Sharl Ma  Dyslipidemia Discharged on statin Rx- LDL was 158 at discharge  Non-insulin dependent type 2 diabetes mellitus (HCC) Followed by Dr Everardo All  Essential hypertension Fair control-  PLAN:    I suggested she could stop the Atenolol.  I asked her to confirm her Metoprolol dose at home- she should be on 25 mg BID. I suggested this could be increased to 50 mg BID if needed for recurrent palpitations.  She needs a f/u with Dr Jens Som in the next 2-3 months.  She knows to contact us if she has recurrent palpitations or develops chest pain or SOB.    Medication Adjustments/Labs and Tests Ordered: Current medicines are reviewed at length with the patient today.  Concerns regarding medicines are outlined above.  No orders of the defined types were placed in this encounter.  No orders of the defined types were placed in this encounter.   Patient Instructions  Medication Instruction Stop: Atenolol 25 mg daily  *If you need a refill on your cardiac medications before your next  appointment, please call your pharmacy*   Lab Work: None  Testing/Procedures: None   Follow-Up: At [facility], you and your health needs are our priority.  As part of our continuing mission to provide you with exceptional heart care, we have created designated Provider Care Teams.  These Care Teams include your primary Cardiologist (physician) and Advanced Practice Providers (APPs -  Physician Assistants and Nurse Practitioners) who all work together to provide you with the care you need, when you need it.  We recommend signing up for the patient portal called "MyChart".  Sign up information is provided on this After Visit Summary.  MyChart is used to connect with patients for Virtual Visits (Telemedicine).  Patients are able to view lab/test results, encounter notes, upcoming appointments, etc.  Non-urgent messages can be sent to your provider as well.   To learn more about what you can do with MyChart, go to [email or web address].    Your next appointment:   2-3 month(s)  The format for your next appointment:   In Person  Provider:   Olga Millers, MD   Other Instructions Please bring medication to next visit.  Please let us know if you have any episodes of fast heart rate.       Chelsea Provost, PA-C  11/07/2019 8:37 AM    [facility]

## 2019-11-07 NOTE — Assessment & Plan Note (Signed)
Admitted 10/07/2019 with chest pain and elevated Troponin-peak 21. Pt also noted to have anemia and hyperthyroid- not cathed- OP Myoview negative. Suspect this was demand ischemia secondary to her anemia and hyperthyroidism

## 2019-11-07 NOTE — Patient Instructions (Signed)
Medication Instruction Stop: Atenolol 25 mg daily  *If you need a refill on your cardiac medications before your next appointment, please call your pharmacy*   Lab Work: None  Testing/Procedures: None   Follow-Up: At [facility], you and your health needs are our priority.  As part of our continuing mission to provide you with exceptional heart care, we have created designated Provider Care Teams.  These Care Teams include your primary Cardiologist (physician) and Advanced Practice Providers (APPs -  Physician Assistants and Nurse Practitioners) who all work together to provide you with the care you need, when you need it.  We recommend signing up for the patient portal called "MyChart".  Sign up information is provided on this After Visit Summary.  MyChart is used to connect with patients for Virtual Visits (Telemedicine).  Patients are able to view lab/test results, encounter notes, upcoming appointments, etc.  Non-urgent messages can be sent to your provider as well.   To learn more about what you can do with MyChart, go to [email or web address].    Your next appointment:   2-3 month(s)  The format for your next appointment:   In Person  Provider:   Olga Millers, MD   Other Instructions Please bring medication to next visit.  Please let us know if you have any episodes of fast heart rate.

## 2019-11-07 NOTE — Assessment & Plan Note (Signed)
TSH 0.010, T4 2.06 on adm 10/07/2019 Pt is on Tapazole- followed by Dr Sharl Ma

## 2019-11-07 NOTE — Assessment & Plan Note (Signed)
Fair control.

## 2019-11-21 ENCOUNTER — Telehealth: Payer: Self-pay | Admitting: Endocrinology

## 2019-11-21 NOTE — Telephone Encounter (Signed)
please contact patient: F/u is due 

## 2019-11-22 NOTE — Telephone Encounter (Signed)
Please refer to Dr. George Hugh request below. It appears this pt will need to re-establish as a new pt.

## 2019-12-18 ENCOUNTER — Other Ambulatory Visit (HOSPITAL_COMMUNITY): Payer: Self-pay | Admitting: Internal Medicine

## 2019-12-18 ENCOUNTER — Other Ambulatory Visit: Payer: Self-pay | Admitting: Internal Medicine

## 2019-12-18 DIAGNOSIS — E0590 Thyrotoxicosis, unspecified without thyrotoxic crisis or storm: Secondary | ICD-10-CM

## 2020-01-04 ENCOUNTER — Encounter (HOSPITAL_COMMUNITY)
Admission: RE | Admit: 2020-01-04 | Discharge: 2020-01-04 | Disposition: A | Discharge status: Home / Self Care | Payer: Medicare Other | Source: Ambulatory Visit | Attending: Internal Medicine | Admitting: Internal Medicine

## 2020-01-04 ENCOUNTER — Other Ambulatory Visit: Payer: Self-pay

## 2020-01-04 DIAGNOSIS — E0590 Thyrotoxicosis, unspecified without thyrotoxic crisis or storm: Secondary | ICD-10-CM | POA: Diagnosis present

## 2020-01-04 MED ORDER — SODIUM IODIDE I-123 7.4 MBQ CAPS
487.0000 | ORAL_CAPSULE | Freq: Once | ORAL | Status: AC
  Administered 2020-01-04: 487 via ORAL

## 2020-01-05 ENCOUNTER — Encounter (HOSPITAL_COMMUNITY)
Admission: RE | Admit: 2020-01-05 | Discharge: 2020-01-05 | Disposition: A | Discharge status: Home / Self Care | Payer: Medicare Other | Source: Ambulatory Visit | Attending: Internal Medicine | Admitting: Internal Medicine

## 2020-01-05 DIAGNOSIS — E0590 Thyrotoxicosis, unspecified without thyrotoxic crisis or storm: Secondary | ICD-10-CM | POA: Insufficient documentation

## 2020-02-05 NOTE — Progress Notes (Deleted)
HPI: Follow-up chest pain.  Patient admitted July 2021 with unstable angina.  Lower extremity venous Doppler showed no DVT.  Echocardiogram showed vigorous LV function.  CTA showed no pulmonary embolus.  TSH was felt to be elevated.  Original plan was for cardiac catheterization but this was delayed due to hyperthyroidism.  Patient treated with methimazole.  Also with anemia.  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  Also with superficial antral ulcers.  Nuclear study July 2021 showed ejection fraction 75% and no ischemia.  Since last seen  Current Outpatient Medications  Medication Sig Dispense Refill  . aspirin 81 MG chewable tablet Chew 1 tablet (81 mg total) by mouth daily. 30 tablet 0  . atorvastatin (LIPITOR) 40 MG tablet Take 40 mg by mouth daily.    . ferrous sulfate 325 (65 FE) MG tablet Take 1 tablet (325 mg total) by mouth daily. 30 tablet 3  . FREESTYLE LITE test strip CHECK BLOOD SUGAR ONCE A DAY (Patient taking differently: 1 each by Other route daily. ) 100 each 2  . gabapentin (NEURONTIN) 100 MG capsule Take 2 capsules (200 mg total) by mouth 2 (two) times daily. 60 capsule 0  . INVOKANA 300 MG TABS tablet TAKE 1 TABLET BY MOUTH EVERY DAY (Patient taking differently: Take 300 mg by mouth daily before breakfast. ) 30 tablet 0  . metFORMIN (GLUCOPHAGE) 500 MG tablet Take 1 tablet (500 mg total) by mouth 2 (two) times daily with a meal. (Patient taking differently: Take 500 mg by mouth daily with breakfast. ) 60 tablet 11  . methimazole (TAPAZOLE) 10 MG tablet Take 2 tablets (20 mg total) by mouth daily. 30 tablet 3  . metoprolol tartrate (LOPRESSOR) 25 MG tablet Take 1 tablet (25 mg total) by mouth 2 (two) times daily. 60 tablet 0  . nicotine (NICODERM CQ - DOSED IN MG/24 HOURS) 14 mg/24hr patch Place 1 patch (14 mg total) onto the skin daily. 28 patch 0  .  nitroGLYCERIN (NITROSTAT) 0.4 MG SL tablet Place 1 tablet (0.4 mg total) under the tongue every 5 (five) minutes as needed for chest pain. 100 tablet 3  . NOVOTWIST 32G X 5 MM MISC USE DAILY FOR INJECTION (Patient taking differently: 1 each by Other route daily. ) 100 each 1  . olmesartan (BENICAR) 40 MG tablet Take 40 mg by mouth daily.     . pantoprazole (PROTONIX) 40 MG tablet Take 1 tablet (40 mg total) by mouth daily. 30 tablet 0  . TRULICITY 1.5 MG/0.5ML SOPN INJECT 1.5 MG INTO THE SKIN ONCE A WEEK. 4 pen 11  . vitamin B-12 1000 MCG tablet Take 1 tablet (1,000 mcg total) by mouth daily. 30 tablet 0   No current facility-administered medications for this visit.     Past Medical History:  Diagnosis Date  . Blood transfusion    d/t hemmorrhage post c- section  . DEPRESSION 01/24/2009  . DM 01/24/2009  . 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 History of one miscarriage   . HYPERCHOLESTEROLEMIA 01/24/2009  . HYPERTENSION 01/24/2009  . Osteoarthritis   . Pulmonary embolism (HCC)    result of blood transfusion reaction 18 years ago after childbirth    Past Surgical History:  Procedure Laterality Date  . BIOPSY  10/09/2019   Procedure: BIOPSY;  Surgeon: Kerin Salen, MD;  Location: [facility] ENDOSCOPY;  Service: Gastroenterology;;  . BREAST CYST EXCISION    . BREAST REDUCTION SURGERY    . CERVICAL CERCLAGE    . CESAREAN SECTION    . COLONOSCOPY WITH PROPOFOL N/A 10/09/2019   Procedure: COLONOSCOPY WITH PROPOFOL;  Surgeon: Kerin Salen, MD;  Location: [facility] ENDOSCOPY;  Service: Gastroenterology;  Laterality: N/A;  . ESOPHAGOGASTRODUODENOSCOPY (EGD) WITH PROPOFOL N/A 10/09/2019   Procedure: ESOPHAGOGASTRODUODENOSCOPY (EGD) WITH PROPOFOL;  Surgeon: Kerin Salen, MD;  Location: [facility] ENDOSCOPY;  Service: Gastroenterology;  Laterality: N/A;  . HEMOSTASIS CLIP PLACEMENT  10/09/2019   Procedure: HEMOSTASIS CLIP PLACEMENT;  Surgeon: Kerin Salen, MD;  Location: [facility]  ENDOSCOPY;  Service: Gastroenterology;;  . HOT HEMOSTASIS N/A 10/09/2019   Procedure: HOT HEMOSTASIS (ARGON PLASMA COAGULATION/BICAP);  Surgeon: Kerin Salen, MD;  Location: [facility] ENDOSCOPY;  Service: Gastroenterology;  Laterality: N/A;  . JOINT REPLACEMENT    . KNEE ARTHROPLASTY  07/14/2011   Procedure: COMPUTER ASSISTED TOTAL KNEE ARTHROPLASTY;  Surgeon: Cammy Copa, MD;  Location: [facility] OR;  Service: Orthopedics;  Laterality: Left;  Left total knee arthroplasty  . POLYPECTOMY  10/09/2019   Procedure: POLYPECTOMY;  Surgeon: Kerin Salen, MD;  Location: [facility] ENDOSCOPY;  Service: Gastroenterology;;  . REDUCTION MAMMAPLASTY Bilateral     Social History   Socioeconomic History  . Marital status: Married    Spouse name: Not on file  . Number of children: Not on file  . Years of education: Not on file  . Highest education level: Not on file  Occupational History  . Occupation: Neurosurgeon: [employer]  Tobacco Use  . Smoking status: Current Every Day Smoker    Packs/day: 0.10    Years: 6.00    Pack years: 0.60  . Smokeless tobacco: Never Used  Substance and Sexual Activity  . Alcohol use: Yes    Comment: once drink every few months  . Drug use: No  . Sexual activity: Not Currently  Other Topics Concern  . Not on file  Social History Narrative  . Not on file   Social Determinants of Health   Financial Resource Strain:   . Difficulty of Paying Living Expenses: Not on file  Food Insecurity:   . Worried About Programme researcher, broadcasting/film/video in the Last Year: Not on file  . Ran Out of Food in the Last Year: Not on file  Transportation Needs:   . Lack of Transportation (Medical): Not on file  . Lack of Transportation (Non-Medical): Not on file  Physical Activity:   . Days of Exercise per Week: Not on file  . Minutes of Exercise per Session: Not on file  Stress:   . Feeling of Stress : Not on file  Social Connections:   . Frequency of Communication with Friends and Family: Not on  file  . Frequency of Social Gatherings with Friends and Family: Not on file  . Attends Religious Services: Not on file  . Active Member of Clubs or Organizations: Not on file  . Attends Banker Meetings: Not on file  . Marital Status: Not on file  Intimate Partner Violence:   . Fear of Current or Ex-Partner: Not on file  . Emotionally Abused: Not on file  . Physically Abused: Not on file  . Sexually Abused: Not on file    Family History  Problem Relation Age of Onset  . Diabetes Mother   . Hypertension Mother   . Diabetes Father   .  Arrhythmia Father   . Heart disease Father        CABG  . Breast cancer Sister   . Diabetes Sister   . Anesthesia problems Neg Hx     ROS: no fevers or chills, productive cough, hemoptysis, dysphasia, odynophagia, melena, hematochezia, dysuria, hematuria, rash, seizure activity, orthopnea, PND, pedal edema, claudication. Remaining systems are negative.  Physical Exam: Well-developed well-nourished in no acute distress.  Skin is warm and dry.  HEENT is normal.  Neck is supple.  Chest is clear to auscultation with normal expansion.  Cardiovascular exam is regular rate and rhythm.  Abdominal exam nontender or distended. No masses palpated. Extremities show no edema. neuro grossly intact  ECG- personally reviewed  A/P  1 chest pain-no recurrences.  Previous nuclear study negative.  2 hypertension-blood pressure controlled.  Continue present medications and follow.  3 hyperlipidemia-continue statin.  Check lipids and liver.  4 hyperthyroidism-Per endocrinology.  5 palpitations-continue beta-blocker.  Olga Millers, MD

## 2020-02-09 ENCOUNTER — Other Ambulatory Visit: Payer: Self-pay

## 2020-02-09 ENCOUNTER — Ambulatory Visit (INDEPENDENT_AMBULATORY_CARE_PROVIDER_SITE_OTHER): Payer: Medicare Other

## 2020-02-09 ENCOUNTER — Ambulatory Visit (INDEPENDENT_AMBULATORY_CARE_PROVIDER_SITE_OTHER): Payer: Medicare Other | Admitting: Surgical

## 2020-02-09 ENCOUNTER — Encounter: Payer: Self-pay | Admitting: Surgical

## 2020-02-09 DIAGNOSIS — M1711 Unilateral primary osteoarthritis, right knee: Secondary | ICD-10-CM

## 2020-02-09 MED ORDER — METHYLPREDNISOLONE ACETATE 40 MG/ML IJ SUSP
40.0000 mg | INTRAMUSCULAR | Status: AC | PRN
  Administered 2020-02-09: 40 mg via INTRA_ARTICULAR

## 2020-02-09 MED ORDER — BUPIVACAINE HCL 0.25 % IJ SOLN
4.0000 mL | INTRAMUSCULAR | Status: AC | PRN
  Administered 2020-02-09: 4 mL via INTRA_ARTICULAR

## 2020-02-09 MED ORDER — LIDOCAINE HCL 1 % IJ SOLN
5.0000 mL | INTRAMUSCULAR | Status: AC | PRN
  Administered 2020-02-09: 5 mL

## 2020-02-09 NOTE — Progress Notes (Deleted)
xr kn 

## 2020-02-09 NOTE — Progress Notes (Signed)
Office Visit Note   Patient: Chelsea Gray           Date of Birth: 1955-09-20           MRN: [id number] Visit Date: 02/09/2020 Requested by: Maurice Small, MD [address] [place],  [place] [address] PCP: Maurice Small, MD  Subjective: Chief Complaint  Patient presents with  . Right Knee - Pain    HPI: Chelsea Gray is a 64 y.o. female who presents to the office complaining of right knee pain.  Patient denies any injury.  She notes pain began on Wednesday without any acute event.  She is retired and she does walk a lot but no significant injuries in the amount of walking that she has done recently.  She localizes the majority of her pain to the anterior aspect of the right knee.  She has had previous bout of right knee pain that was last in 2019 that was successfully treated with knee injection.  She denies any history of right knee surgery but she has had a left knee replacement by Dr. August Saucer years ago.  Denies any mechanical symptoms but she does note that knee occasionally gives out on her.  Denies any history of gout.  Denies any groin pain or low back pain with radicular pain.Patient does have history of DM. She reports her last A1c was 7.6.                ROS: All systems reviewed are negative as they relate to the chief complaint within the history of present illness.  Patient denies fevers or chills.  Assessment & Plan: Visit Diagnoses:  1. Primary osteoarthritis of right knee     Plan: Patient is a 64 year old female presents complaining of right knee pain.  She has history of previous episode of right knee pain years ago that was treated with injection.  No precipitating event leading to onset of pain most recently.  Radiographs of the right knee were taken today and show mild to moderate degenerative changes without any fracture or acute changes of the right knee.  Examination reveals small joint effusion of the right knee with tenderness over the lateral  joint line primarily.  Impression is acute exacerbation of existing osteoarthritis of the right knee.  Discussed options available to patient.  She wishes to proceed with right knee injection.  She notes her last A1c was around 7.6.  Informed patient that she should monitor her blood sugar over the next 5 days by checking her blood sugar every day. She agreed and will reach out to her PCP if her blood glucose is significantly elevated. She tolerated the cortisone injection well.  Plan for patient to follow-up as needed.  Follow-Up Instructions: No follow-ups on file.   Orders:  Orders Placed This Encounter  Procedures  . XR KNEE 3 VIEW RIGHT   No orders of the defined types were placed in this encounter.     Procedures: Large Joint Inj: R knee on 02/09/2020 2:57 PM Indications: diagnostic evaluation, joint swelling and pain Details: 18 G 1.5 in needle, superolateral approach  Arthrogram: No  Medications: 5 mL lidocaine 1 %; 40 mg methylPREDNISolone acetate 40 MG/ML; 4 mL bupivacaine 0.25 % Outcome: tolerated well, no immediate complications Procedure, treatment alternatives, risks and benefits explained, specific risks discussed. Consent was given by the patient. Immediately prior to procedure a time out was called to verify the correct patient, procedure, equipment, support staff and site/side  marked as required. Patient was prepped and draped in the usual sterile fashion.       Clinical Data: No additional findings.  Objective: Vital Signs: There were no vitals taken for this visit.  Physical Exam:  Constitutional: Patient appears well-developed HEENT:  Head: Normocephalic Eyes:EOM are normal Neck: Normal range of motion Cardiovascular: Normal rate Pulmonary/chest: Effort normal Neurologic: Patient is alert Skin: Skin is warm Psychiatric: Patient has normal mood and affect  Ortho Exam: Ortho exam demonstrates right knee with positive effusion.  Tenderness over the  lateral joint line and patellar tendon.  No tenderness over the medial joint line, quadricep tendon, patella, pes anserine bursa, tibial tubercle.  No difficulty with straight leg raise.  Excellent range of motion of the right knee with 0 degrees extension to 120 degrees of flexion.  No pain with hip range of motion.  Negative straight leg raise.  Specialty Comments:  No specialty comments available.  Imaging: No results found.   PMFS History: Patient Active Problem List   Diagnosis Date Noted  . Hyperthyroidism 11/07/2019  . Dyslipidemia 11/07/2019  . Chest pain 10/08/2019  . Chest pain with moderate risk for cardiac etiology 10/07/2019  . Abdominal pain, unspecified site 12/24/2010  . MYALGIA 06/19/2009  . NUMBNESS 06/19/2009  . Non-insulin dependent type 2 diabetes mellitus (HCC) 01/24/2009  . HYPERCHOLESTEROLEMIA 01/24/2009  . SMOKER 01/24/2009  . DEPRESSION 01/24/2009  . Essential hypertension 01/24/2009  . CHEST PAIN 01/24/2009  . Neuropathy 01/24/2009  . Iron deficiency anemia due to chronic blood loss 01/24/2009   Past Medical History:  Diagnosis Date  . Blood transfusion    d/t hemmorrhage post c- section  . DEPRESSION 01/24/2009  . DM 01/24/2009  . 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 History of one miscarriage   . HYPERCHOLESTEROLEMIA 01/24/2009  . HYPERTENSION 01/24/2009  . Osteoarthritis   . Pulmonary embolism (HCC)    result of blood transfusion reaction 18 years ago after childbirth    Family History  Problem Relation Age of Onset  . Diabetes Mother   . Hypertension Mother   . Diabetes Father   . Arrhythmia Father   . Heart disease Father        CABG  . Breast cancer Sister   . Diabetes Sister   . Anesthesia problems Neg Hx     Past Surgical History:  Procedure Laterality Date  . BIOPSY  10/09/2019   Procedure: BIOPSY;  Surgeon: Kerin Salen, MD;  Location: [facility] ENDOSCOPY;  Service:  Gastroenterology;;  . BREAST CYST EXCISION    . BREAST REDUCTION SURGERY    . CERVICAL CERCLAGE    . CESAREAN SECTION    . COLONOSCOPY WITH PROPOFOL N/A 10/09/2019   Procedure: COLONOSCOPY WITH PROPOFOL;  Surgeon: Kerin Salen, MD;  Location: [facility] ENDOSCOPY;  Service: Gastroenterology;  Laterality: N/A;  . ESOPHAGOGASTRODUODENOSCOPY (EGD) WITH PROPOFOL N/A 10/09/2019   Procedure: ESOPHAGOGASTRODUODENOSCOPY (EGD) WITH PROPOFOL;  Surgeon: Kerin Salen, MD;  Location: [facility] ENDOSCOPY;  Service: Gastroenterology;  Laterality: N/A;  . HEMOSTASIS CLIP PLACEMENT  10/09/2019   Procedure: HEMOSTASIS CLIP PLACEMENT;  Surgeon: Kerin Salen, MD;  Location: [facility] ENDOSCOPY;  Service: Gastroenterology;;  . HOT HEMOSTASIS N/A 10/09/2019   Procedure: HOT HEMOSTASIS (ARGON PLASMA COAGULATION/BICAP);  Surgeon: Kerin Salen, MD;  Location: [facility] ENDOSCOPY;  Service: Gastroenterology;  Laterality: N/A;  . JOINT REPLACEMENT    . KNEE ARTHROPLASTY  07/14/2011   Procedure: COMPUTER ASSISTED TOTAL KNEE ARTHROPLASTY;  Surgeon: Cammy Copa, MD;  Location: [facility] OR;  Service: Orthopedics;  Laterality: Left;  Left total knee arthroplasty  . POLYPECTOMY  10/09/2019   Procedure: POLYPECTOMY;  Surgeon: Kerin Salen, MD;  Location: [facility] ENDOSCOPY;  Service: Gastroenterology;;  . REDUCTION MAMMAPLASTY Bilateral    Social History   Occupational History  . Occupation: Neurosurgeon: [employer]  Tobacco Use  . Smoking status: Current Every Day Smoker    Packs/day: 0.10    Years: 6.00    Pack years: 0.60  . Smokeless tobacco: Never Used  Substance and Sexual Activity  . Alcohol use: Yes    Comment: once drink every few months  . Drug use: No  . Sexual activity: Not Currently

## 2020-02-12 ENCOUNTER — Ambulatory Visit: Payer: Medicare Other | Admitting: Cardiology

## 2020-02-13 ENCOUNTER — Other Ambulatory Visit: Payer: Self-pay | Admitting: Family Medicine

## 2020-02-13 DIAGNOSIS — E2839 Other primary ovarian failure: Secondary | ICD-10-CM

## 2020-02-28 MED ORDER — METOPROLOL TARTRATE 25 MG PO TABS
25.0000 mg | ORAL_TABLET | Freq: Two times a day (BID) | ORAL | 6 refills | Status: DC
Start: 2020-02-28 — End: 2020-08-20

## 2020-04-17 ENCOUNTER — Other Ambulatory Visit: Payer: Self-pay

## 2020-04-17 ENCOUNTER — Ambulatory Visit (INDEPENDENT_AMBULATORY_CARE_PROVIDER_SITE_OTHER): Payer: 59 | Admitting: Orthopedic Surgery

## 2020-04-17 ENCOUNTER — Ambulatory Visit: Payer: Self-pay

## 2020-04-17 DIAGNOSIS — M25552 Pain in left hip: Secondary | ICD-10-CM | POA: Diagnosis not present

## 2020-04-18 ENCOUNTER — Telehealth: Payer: Self-pay | Admitting: Physical Medicine and Rehabilitation

## 2020-04-18 NOTE — Telephone Encounter (Signed)
Looks like this patient saw Dr. August Saucer yesterday. Does he want her to have a hip injection?

## 2020-04-18 NOTE — Telephone Encounter (Signed)
Pt called wanting to schedule an injection with Dr.Newton  [phone]

## 2020-04-18 NOTE — Telephone Encounter (Signed)
Can either of you advise? 

## 2020-04-19 NOTE — Telephone Encounter (Signed)
I have put in order for this.  

## 2020-04-19 NOTE — Telephone Encounter (Signed)
Yes pls schedule left hip intraarticular injection thcx

## 2020-04-20 ENCOUNTER — Encounter: Payer: Self-pay | Admitting: Orthopedic Surgery

## 2020-04-20 ENCOUNTER — Other Ambulatory Visit: Payer: Self-pay | Admitting: Family Medicine

## 2020-04-20 DIAGNOSIS — Z1231 Encounter for screening mammogram for malignant neoplasm of breast: Secondary | ICD-10-CM

## 2020-04-20 NOTE — Progress Notes (Signed)
Office Visit Note   Patient: Chelsea Gray           Date of Birth: [birth year]           MRN: [id number] Visit Date: 04/17/2020 Requested by: Maurice Small, MD [address] [place],  [place] [address] PCP: Maurice Small, MD  Subjective: Chief Complaint  Patient presents with  . Left Hip - Pain    HPI: Chelsea Gray is a 65 y.o. female who presents to the office complaining of left hip pain.  Patient notes pain over the last 3 weeks that she localizes to the anterior left groin.  She describes this is a stabbing pain that has been steadily worsening.  She denies any injury leading to onset of pain.  She notes pain with hip flexion and pain that wakes her up at night.  She has a catching sensation in the left hip.  Pain is worse with sitting as well as walking.  She has had minimal relief from Advil and she does not like to take NSAIDs due to her history of hypertension.  She does have history of diabetes with last A1c 7.5.  Denies any radicular pain down her leg, buttocks pain, low back pain, numbness/tingling..                ROS: All systems reviewed are negative as they relate to the chief complaint within the history of present illness.  Patient denies fevers or chills.  Assessment & Plan: Visit Diagnoses:  1. Pain in left hip     Plan: Patient is a 65 year old female who presents complaining of left hip pain.  She has had worsening pain that she localizes to the groin over the last 3 weeks without injury.  Pain is now waking her up at night.  Worse with hip flexion and external rotation on exam with a positive central time.  Radiographs of the left hip are negative for any acute findings or any significant degenerative changes.  Discussed options available to patient.  After discussion, plan to refer patient to Dr. Naaman Plummer for left hip intra-articular injection.  Follow-up in 4 weeks for clinical recheck.  Obtain lumbar spine radiographs to rule out any lumbar  spine pathology at that time.  If no better, consider MRI left hip.  Patient agreed with this plan.  Follow-Up Instructions: No follow-ups on file.   Orders:  Orders Placed This Encounter  Procedures  . XR HIP UNILAT W OR W/O PELVIS 2-3 VIEWS LEFT  . Ambulatory referral to Physical Medicine Rehab   No orders of the defined types were placed in this encounter.     Procedures: No procedures performed   Clinical Data: No additional findings.  Objective: Vital Signs: There were no vitals taken for this visit.  Physical Exam:  Constitutional: Patient appears well-developed HEENT:  Head: Normocephalic Eyes:EOM are normal Neck: Normal range of motion Cardiovascular: Normal rate Pulmonary/chest: Effort normal Neurologic: Patient is alert Skin: Skin is warm Psychiatric: Patient has normal mood and affect  Ortho Exam: Ortho exam demonstrates left hip with pain with passive hip flexion and external rotation.  No significant pain with internal rotation.  Positive Stinchfield exam on the left.  Negative on the right.  Negative straight leg raise of the bilateral lower extremities.  No tenderness over the greater trochanter bilaterally.  No tenderness over the axial lumbar spine.  5/5 motor strength of bilateral hip flexors, quadriceps, hamstring, dorsiflexion, plantarflexion.  Specialty Comments:  No specialty comments available.  Imaging: No results found.   PMFS History: Patient Active Problem List   Diagnosis Date Noted  . Hyperthyroidism 11/07/2019  . Dyslipidemia 11/07/2019  . Chest pain 10/08/2019  . Chest pain with moderate risk for cardiac etiology 10/07/2019  . Abdominal pain, unspecified site 12/24/2010  . MYALGIA 06/19/2009  . NUMBNESS 06/19/2009  . Non-insulin dependent type 2 diabetes mellitus (HCC) 01/24/2009  . HYPERCHOLESTEROLEMIA 01/24/2009  . SMOKER 01/24/2009  . DEPRESSION 01/24/2009  . Essential hypertension 01/24/2009  . CHEST PAIN 01/24/2009  .  Neuropathy 01/24/2009  . Iron deficiency anemia due to chronic blood loss 01/24/2009   Past Medical History:  Diagnosis Date  . Blood transfusion    d/t hemmorrhage post c- section  . DEPRESSION 01/24/2009  . DM 01/24/2009  . 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 History of one miscarriage   . HYPERCHOLESTEROLEMIA 01/24/2009  . HYPERTENSION 01/24/2009  . Osteoarthritis   . Pulmonary embolism (HCC)    result of blood transfusion reaction 18 years ago after childbirth    Family History  Problem Relation Age of Onset  . Diabetes Mother   . Hypertension Mother   . Diabetes Father   . Arrhythmia Father   . Heart disease Father        CABG  . Breast cancer Sister   . Diabetes Sister   . Anesthesia problems Neg Hx     Past Surgical History:  Procedure Laterality Date  . BIOPSY  10/09/2019   Procedure: BIOPSY;  Surgeon: Kerin Salen, MD;  Location: [facility] ENDOSCOPY;  Service: Gastroenterology;;  . BREAST CYST EXCISION    . BREAST REDUCTION SURGERY    . CERVICAL CERCLAGE    . CESAREAN SECTION    . COLONOSCOPY WITH PROPOFOL N/A 10/09/2019   Procedure: COLONOSCOPY WITH PROPOFOL;  Surgeon: Kerin Salen, MD;  Location: [facility] ENDOSCOPY;  Service: Gastroenterology;  Laterality: N/A;  . ESOPHAGOGASTRODUODENOSCOPY (EGD) WITH PROPOFOL N/A 10/09/2019   Procedure: ESOPHAGOGASTRODUODENOSCOPY (EGD) WITH PROPOFOL;  Surgeon: Kerin Salen, MD;  Location: [facility] ENDOSCOPY;  Service: Gastroenterology;  Laterality: N/A;  . HEMOSTASIS CLIP PLACEMENT  10/09/2019   Procedure: HEMOSTASIS CLIP PLACEMENT;  Surgeon: Kerin Salen, MD;  Location: [facility] ENDOSCOPY;  Service: Gastroenterology;;  . HOT HEMOSTASIS N/A 10/09/2019   Procedure: HOT HEMOSTASIS (ARGON PLASMA COAGULATION/BICAP);  Surgeon: Kerin Salen, MD;  Location: [facility] ENDOSCOPY;  Service: Gastroenterology;  Laterality: N/A;  . JOINT REPLACEMENT    . KNEE ARTHROPLASTY  07/14/2011   Procedure: COMPUTER ASSISTED TOTAL KNEE  ARTHROPLASTY;  Surgeon: Cammy Copa, MD;  Location: [facility] OR;  Service: Orthopedics;  Laterality: Left;  Left total knee arthroplasty  . POLYPECTOMY  10/09/2019   Procedure: POLYPECTOMY;  Surgeon: Kerin Salen, MD;  Location: [facility] ENDOSCOPY;  Service: Gastroenterology;;  . REDUCTION MAMMAPLASTY Bilateral    Social History   Occupational History  . Occupation: Neurosurgeon: [employer]  Tobacco Use  . Smoking status: Current Every Day Smoker    Packs/day: 0.10    Years: 6.00    Pack years: 0.60  . Smokeless tobacco: Never Used  Substance and Sexual Activity  . Alcohol use: Yes    Comment: once drink every few months  . Drug use: No  . Sexual activity: Not Currently

## 2020-05-03 ENCOUNTER — Telehealth: Payer: Self-pay | Admitting: Physical Medicine and Rehabilitation

## 2020-05-03 NOTE — Telephone Encounter (Signed)
Pt called wanting to cancel her appt for 05/06/20 and she doesn't need to resched.

## 2020-05-06 ENCOUNTER — Ambulatory Visit: Payer: 59 | Admitting: Physical Medicine and Rehabilitation

## 2020-05-06 NOTE — Telephone Encounter (Signed)
Appointment and referral cancelled.

## 2020-08-02 ENCOUNTER — Other Ambulatory Visit: Payer: Self-pay | Admitting: Physician Assistant

## 2020-08-02 DIAGNOSIS — R1084 Generalized abdominal pain: Secondary | ICD-10-CM

## 2020-08-16 ENCOUNTER — Ambulatory Visit: Payer: 59

## 2020-08-16 ENCOUNTER — Other Ambulatory Visit: Payer: 59

## 2020-08-20 ENCOUNTER — Other Ambulatory Visit: Payer: Self-pay

## 2020-08-20 ENCOUNTER — Other Ambulatory Visit: Payer: Self-pay | Admitting: Cardiology

## 2020-08-20 ENCOUNTER — Ambulatory Visit
Admission: RE | Admit: 2020-08-20 | Discharge: 2020-08-20 | Disposition: A | Discharge status: Home / Self Care | Payer: Medicare Other | Source: Ambulatory Visit | Attending: Family Medicine | Admitting: Family Medicine

## 2020-08-20 DIAGNOSIS — E2839 Other primary ovarian failure: Secondary | ICD-10-CM

## 2020-08-20 DIAGNOSIS — Z1231 Encounter for screening mammogram for malignant neoplasm of breast: Secondary | ICD-10-CM

## 2020-08-21 ENCOUNTER — Ambulatory Visit
Admission: RE | Admit: 2020-08-21 | Discharge: 2020-08-21 | Disposition: A | Discharge status: Home / Self Care | Payer: 59 | Source: Ambulatory Visit | Attending: Physician Assistant | Admitting: Physician Assistant

## 2020-08-21 DIAGNOSIS — R1084 Generalized abdominal pain: Secondary | ICD-10-CM

## 2020-10-03 ENCOUNTER — Other Ambulatory Visit: Payer: Self-pay | Admitting: Physician Assistant

## 2020-10-03 DIAGNOSIS — R935 Abnormal findings on diagnostic imaging of other abdominal regions, including retroperitoneum: Secondary | ICD-10-CM

## 2020-10-08 ENCOUNTER — Other Ambulatory Visit: Payer: Medicare Other

## 2020-10-15 ENCOUNTER — Ambulatory Visit
Admission: RE | Admit: 2020-10-15 | Discharge: 2020-10-15 | Disposition: A | Discharge status: Home / Self Care | Payer: Medicare Other | Source: Ambulatory Visit | Attending: Physician Assistant | Admitting: Physician Assistant

## 2020-10-15 ENCOUNTER — Other Ambulatory Visit: Payer: Self-pay

## 2020-10-15 DIAGNOSIS — R935 Abnormal findings on diagnostic imaging of other abdominal regions, including retroperitoneum: Secondary | ICD-10-CM

## 2020-10-15 MED ORDER — GADOBENATE DIMEGLUMINE 529 MG/ML IV SOLN
15.0000 mL | Freq: Once | INTRAVENOUS | Status: AC | PRN
  Administered 2020-10-15: 15 mL via INTRAVENOUS

## 2021-02-05 ENCOUNTER — Other Ambulatory Visit: Payer: Self-pay | Admitting: Cardiology

## 2021-03-11 ENCOUNTER — Other Ambulatory Visit: Payer: Self-pay | Admitting: Cardiology

## 2021-05-25 ENCOUNTER — Other Ambulatory Visit: Payer: Self-pay | Admitting: Cardiology

## 2021-06-17 ENCOUNTER — Other Ambulatory Visit: Payer: Self-pay | Admitting: Cardiology

## 2021-07-27 ENCOUNTER — Other Ambulatory Visit: Payer: Self-pay | Admitting: Cardiology

## 2021-07-30 ENCOUNTER — Ambulatory Visit (INDEPENDENT_AMBULATORY_CARE_PROVIDER_SITE_OTHER): Payer: Medicare Other

## 2021-07-30 ENCOUNTER — Encounter: Payer: Self-pay | Admitting: Podiatry

## 2021-07-30 ENCOUNTER — Ambulatory Visit: Payer: Medicare Other | Admitting: Podiatry

## 2021-07-30 DIAGNOSIS — M21619 Bunion of unspecified foot: Secondary | ICD-10-CM

## 2021-07-30 DIAGNOSIS — M21611 Bunion of right foot: Secondary | ICD-10-CM | POA: Diagnosis not present

## 2021-07-30 DIAGNOSIS — M2042 Other hammer toe(s) (acquired), left foot: Secondary | ICD-10-CM | POA: Diagnosis not present

## 2021-07-30 DIAGNOSIS — M21612 Bunion of left foot: Secondary | ICD-10-CM

## 2021-07-30 DIAGNOSIS — L84 Corns and callosities: Secondary | ICD-10-CM

## 2021-07-30 DIAGNOSIS — E119 Type 2 diabetes mellitus without complications: Secondary | ICD-10-CM

## 2021-07-30 NOTE — Progress Notes (Signed)
Subjective:  ? ?Patient ID: Chelsea Gray, female   DOB: 66 y.o.   MRN: [id number]  ? 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  States the lesions get sore make it hard for her to walk and she smokes 1/10 of a pack of cigarettes per day and likes to be active ? ? ?Review of Systems  ?All other systems reviewed and are negative. ? ? ?   ?Objective:  ?Physical Exam ?Vitals and nursing note reviewed.  ?Constitutional:   ?   Appearance: She is well-developed.  ?Pulmonary:  ?   Effort: Pulmonary effort is normal.  ?Musculoskeletal:     ?   General: Normal range of motion.  ?Skin: ?   General: Skin is warm.  ?Neurological:  ?   Mental Status: She is alert.  ?  ?Neurovascular status found to be intact muscle strength was found to be adequate range of motion adequate.  Patient is noted to have keratotic lesion subsecond subfifth metatarsals bilateral history of surgery right over left foot with a significant deformity of the second digit right from another physician that is painful when pressed.  Patient has pain with walking secondary to the problem and does have diabetes under good control.  Good digital perfusion well oriented x3 ? ?   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 ?   ?Plan:  ?H&P all conditions reviewed and x-rays reviewed.  Do not recommend further surgery debridement accomplished today and recommended pedicures and home debridement as best as possible and we will take care of any issues which she is not able to handle.  Do not see at this point though surgical intervention in this particular case and explained all that to her ? ?X-rays indicate screws in the right first and second metatarsals  and third metatarsal left with no other significant pathology moderate hammertoe deformity bunion deformity left ?   ? ? ?

## 2021-08-20 ENCOUNTER — Other Ambulatory Visit: Payer: Self-pay | Admitting: Internal Medicine

## 2021-08-20 DIAGNOSIS — Z122 Encounter for screening for malignant neoplasm of respiratory organs: Secondary | ICD-10-CM

## 2021-08-30 ENCOUNTER — Encounter (HOSPITAL_COMMUNITY): Payer: Self-pay | Admitting: Emergency Medicine

## 2021-08-30 ENCOUNTER — Emergency Department (HOSPITAL_COMMUNITY)
Admission: EM | Admit: 2021-08-30 | Discharge: 2021-08-31 | Disposition: A | Discharge status: Home / Self Care | Payer: Medicare Other | Attending: Emergency Medicine | Admitting: Emergency Medicine

## 2021-08-30 ENCOUNTER — Other Ambulatory Visit: Payer: Self-pay

## 2021-08-30 DIAGNOSIS — Z794 Long term (current) use of insulin: Secondary | ICD-10-CM | POA: Insufficient documentation

## 2021-08-30 DIAGNOSIS — R739 Hyperglycemia, unspecified: Secondary | ICD-10-CM | POA: Diagnosis present

## 2021-08-30 DIAGNOSIS — Z7982 Long term (current) use of aspirin: Secondary | ICD-10-CM | POA: Diagnosis not present

## 2021-08-30 DIAGNOSIS — I10 Essential (primary) hypertension: Secondary | ICD-10-CM | POA: Insufficient documentation

## 2021-08-30 DIAGNOSIS — E1165 Type 2 diabetes mellitus with hyperglycemia: Secondary | ICD-10-CM | POA: Diagnosis not present

## 2021-08-30 DIAGNOSIS — Z7984 Long term (current) use of oral hypoglycemic drugs: Secondary | ICD-10-CM | POA: Insufficient documentation

## 2021-08-30 DIAGNOSIS — Z79899 Other long term (current) drug therapy: Secondary | ICD-10-CM | POA: Diagnosis not present

## 2021-08-30 DIAGNOSIS — N179 Acute kidney failure, unspecified: Secondary | ICD-10-CM | POA: Insufficient documentation

## 2021-08-30 DIAGNOSIS — E039 Hypothyroidism, unspecified: Secondary | ICD-10-CM | POA: Insufficient documentation

## 2021-08-30 LAB — URINALYSIS, ROUTINE W REFLEX MICROSCOPIC
Bilirubin Urine: NEGATIVE
Glucose, UA: >=500 mg/dL — AB
Hgb urine dipstick: NEGATIVE
Ketones, ur: 5 mg/dL — AB
Nitrite: NEGATIVE
Protein, ur: 30 mg/dL — AB
Specific Gravity, Urine: 1.024 (ref 1.005–1.030)
pH: 5 (ref 5.0–8.0)

## 2021-08-30 LAB — TSH: TSH: 4.019 u[IU]/mL (ref 0.350–4.500)

## 2021-08-30 LAB — CBC
HCT: 44.2 % (ref 36.0–46.0)
Hemoglobin: 14.5 g/dL (ref 12.0–15.0)
MCH: 29.8 pg (ref 26.0–34.0)
MCHC: 32.8 g/dL (ref 30.0–36.0)
MCV: 90.8 fL (ref 80.0–100.0)
Platelets: 399 10*3/uL (ref 150–400)
RBC: 4.87 MIL/uL (ref 3.87–5.11)
RDW: 14.5 % (ref 11.5–15.5)
WBC: 12.3 10*3/uL — ABNORMAL HIGH (ref 4.0–10.5)
nRBC: 0 % (ref 0.0–0.2)

## 2021-08-30 LAB — BASIC METABOLIC PANEL
Anion gap: 13 (ref 5–15)
BUN: 43 mg/dL — ABNORMAL HIGH (ref 8–23)
CO2: 21 mmol/L — ABNORMAL LOW (ref 22–32)
Calcium: 9.4 mg/dL (ref 8.9–10.3)
Chloride: 100 mmol/L (ref 98–111)
Creatinine, Ser: 1.51 mg/dL — ABNORMAL HIGH (ref 0.44–1.00)
GFR, Estimated: 38 mL/min — ABNORMAL LOW (ref >60)
Glucose, Bld: 194 mg/dL — ABNORMAL HIGH (ref 70–99)
Potassium: 4.8 mmol/L (ref 3.5–5.1)
Sodium: 134 mmol/L — ABNORMAL LOW (ref 135–145)

## 2021-08-30 LAB — CBG MONITORING, ED: Glucose-Capillary: 178 mg/dL — ABNORMAL HIGH (ref 70–99)

## 2021-08-30 LAB — HEPATIC FUNCTION PANEL
ALT: 15 U/L (ref 0–44)
AST: 22 U/L (ref 15–41)
Albumin: 4.2 g/dL (ref 3.5–5.0)
Alkaline Phosphatase: 106 U/L (ref 38–126)
Bilirubin, Direct: 0.1 mg/dL (ref 0.0–0.2)
Indirect Bilirubin: 0.5 mg/dL (ref 0.3–0.9)
Total Bilirubin: 0.6 mg/dL (ref 0.3–1.2)
Total Protein: 7.9 g/dL (ref 6.5–8.1)

## 2021-08-30 LAB — LIPASE, BLOOD: Lipase: 52 U/L — ABNORMAL HIGH (ref 11–51)

## 2021-08-30 MED ORDER — SODIUM CHLORIDE 0.9 % IV BOLUS
1000.0000 mL | Freq: Once | INTRAVENOUS | Status: AC
  Administered 2021-08-30: 1000 mL via INTRAVENOUS

## 2021-08-30 MED ORDER — ONDANSETRON HCL 4 MG/2ML IJ SOLN
4.0000 mg | Freq: Once | INTRAMUSCULAR | Status: AC
  Administered 2021-08-30: 4 mg via INTRAVENOUS
  Filled 2021-08-30: qty 2

## 2021-08-30 MED ORDER — PANTOPRAZOLE SODIUM 40 MG IV SOLR
40.0000 mg | Freq: Once | INTRAVENOUS | Status: AC
  Administered 2021-08-30: 40 mg via INTRAVENOUS
  Filled 2021-08-30: qty 10

## 2021-08-30 NOTE — ED Provider Notes (Signed)
[facility] EMERGENCY DEPARTMENT Provider Note   CSN: [id number] Arrival date & time: 08/30/21  2110     History  Chief Complaint  Patient presents with   Hyperglycemia    Chelsea Gray is a 66 y.o. female with a past medical history of type 2 diabetes, hypercholesterolemia, hypertension and hypothyroidism presenting today with complaint of hyperglycemia, nausea and vomiting.  She reports that on Thursday she had a migraine headache with photophobia.  This continued into Friday when she began to feel nauseated and vomit.  Says the only thing she was able to keep down over the weekend was chicken noodle soup.  Says she is still feeling nauseated and occasionally vomiting.  Denies any diarrhea or constipation.  No urinary symptoms.  Reports a recent history of PUD for which she was prescribed super fate and pantoprazole.  Of note, she did tell me that she has not taken any of her chronic medication for the past 24 days.  Says that she gets tired of taking as many medications as she is prescribed.  Says that her blood pressure and blood sugars have been normal despite cessation of her medications.  Of note, she has an appointment with her endocrinologist on Tuesday.  Denies NSAID or frequent EtOH use.   Hyperglycemia Associated symptoms: abdominal pain, nausea, vomiting and weakness   Associated symptoms: no dizziness, no dysuria and no fever       Home Medications Prior to Admission medications   Medication Sig Start Date End Date Taking? Authorizing Provider  aspirin 81 MG chewable tablet Chew 1 tablet (81 mg total) by mouth daily. 10/11/19   Regalado, Belkys A, MD  atorvastatin (LIPITOR) 40 MG tablet Take 40 mg by mouth daily. 07/13/19   [provider]  empagliflozin (JARDIANCE) 10 MG TABS tablet TAKE 1 TABLET BY MOUTH EVERY DAY FOR 30 DAYS    [provider]  ferrous sulfate 325 (65 FE) MG tablet Take 1 tablet (325 mg total) by mouth daily. 10/10/19  10/09/20  Regalado, Prentiss Bells, MD  FREESTYLE LITE test strip CHECK BLOOD SUGAR ONCE A DAY Patient taking differently: 1 each by Other route daily.  08/27/15   Romero Belling, MD  metFORMIN (GLUCOPHAGE) 500 MG tablet Take 1 tablet (500 mg total) by mouth 2 (two) times daily with a meal. Patient taking differently: Take 500 mg by mouth daily with breakfast.  12/15/16   Romero Belling, MD  methimazole (TAPAZOLE) 10 MG tablet Take 2 tablets (20 mg total) by mouth daily. 10/11/19   Regalado, Belkys A, MD  metoprolol tartrate (LOPRESSOR) 25 MG tablet TAKE 1 TABLET BY MOUTH TWICE A DAY 07/28/21   Lewayne Bunting, MD  nitroGLYCERIN (NITROSTAT) 0.4 MG SL tablet Place 1 tablet (0.4 mg total) under the tongue every 5 (five) minutes as needed for chest pain. 10/10/19 10/09/20  Regalado, Belkys A, MD  NOVOTWIST 32G X 5 MM MISC USE DAILY FOR INJECTION Patient taking differently: 1 each by Other route daily.  05/21/15   Romero Belling, MD  olmesartan (BENICAR) 40 MG tablet Take 40 mg by mouth daily.     [provider]  pantoprazole (PROTONIX) 40 MG tablet Take 1 tablet (40 mg total) by mouth daily. 10/11/19   Regalado, Belkys A, MD  prednisoLONE acetate (PRED FORTE) 1 % ophthalmic suspension SMARTSIG:1 Drop(s) In Eye(s) 03/27/21   [provider]  TRULICITY 1.5 MG/0.5ML SOPN INJECT 1.5 MG INTO THE SKIN ONCE A WEEK. 09/21/16   Everardo All,  Gregary Signs, MD      Allergies    Codeine sulfate    Review of Systems   Review of Systems  Constitutional:  Negative for chills and fever.  Gastrointestinal:  Positive for abdominal pain, nausea and vomiting. Negative for constipation and diarrhea.  Genitourinary:  Negative for dysuria, frequency, hematuria and pelvic pain.  Neurological:  Positive for weakness and headaches. Negative for dizziness.   Physical Exam Updated Vital Signs BP 139/76 (BP Location: Right Arm)   Pulse 66   Temp 98.3 F (36.8 C)   Resp 16   Ht 5\' 5"  (1.651 m)   Wt 78 kg   SpO2 100%   BMI 28.62  kg/m  Physical Exam Vitals and nursing note reviewed.  Constitutional:      Appearance: Normal appearance.  HENT:     Head: Normocephalic and atraumatic.  Eyes:     General: No scleral icterus.    Conjunctiva/sclera: Conjunctivae normal.  Cardiovascular:     Rate and Rhythm: Normal rate and regular rhythm.  Pulmonary:     Effort: Pulmonary effort is normal. No respiratory distress.  Abdominal:     General: Abdomen is flat. There is no distension.     Palpations: Abdomen is soft.     Tenderness: There is abdominal tenderness (Epigastric/periumbilical).     Hernia: No hernia is present.  Skin:    General: Skin is warm and dry.     Coloration: Skin is not pale.     Findings: No rash.  Neurological:     Mental Status: She is alert.  Psychiatric:        Mood and Affect: Mood normal.    ED Results / Procedures / Treatments   Labs (all labs ordered are listed, but only abnormal results are displayed) Labs Reviewed  BASIC METABOLIC PANEL - Abnormal; Notable for the following components:      Result Value   Sodium 134 (*)    CO2 21 (*)    Glucose, Bld 194 (*)    BUN 43 (*)    Creatinine, Ser 1.51 (*)    GFR, Estimated 38 (*)    All other components within normal limits  CBC - Abnormal; Notable for the following components:   WBC 12.3 (*)    All other components within normal limits  URINALYSIS, ROUTINE W REFLEX MICROSCOPIC - Abnormal; Notable for the following components:   Color, Urine AMBER (*)    APPearance CLOUDY (*)    Glucose, UA >=500 (*)    Ketones, ur 5 (*)    Protein, ur 30 (*)    Leukocytes,Ua SMALL (*)    Bacteria, UA MANY (*)    All other components within normal limits  CBG MONITORING, ED - Abnormal; Notable for the following components:   Glucose-Capillary 178 (*)    All other components within normal limits  TSH  HEPATIC FUNCTION PANEL  LIPASE, BLOOD    EKG None  Radiology No results found.  Procedures Procedures  {Document cardiac  monitor, telemetry assessment procedure when appropriate:1}  Medications Ordered in ED Medications  sodium chloride 0.9 % bolus 1,000 mL (has no administration in time range)  pantoprazole (PROTONIX) injection 40 mg (has no administration in time range)  ondansetron (ZOFRAN) injection 4 mg (has no administration in time range)    ED Course/ Medical Decision Making/ A&P  Medical Decision Making Amount and/or Complexity of Data Reviewed Labs: ordered.  Risk Prescription drug management.   Hypovolemia cause of AKI 2/2 NV  {Document critical care time when appropriate:1} {Document review of labs and clinical decision tools ie heart score, Chads2Vasc2 etc:1}  {Document your independent review of radiology images, and any outside records:1} {Document your discussion with family members, caretakers, and with consultants:1} {Document social determinants of health affecting pt's care:1} {Document your decision making why or why not admission, treatments were needed:1} Final Clinical Impression(s) / ED Diagnoses Final diagnoses:  None    Rx / DC Orders ED Discharge Orders     None      Results and diagnoses were explained to the patient. Return precautions discussed in full. Patient had no additional questions and expressed complete understanding.   This chart was dictated using voice recognition software.  Despite best efforts to proofread,  errors can occur which can change the documentation meaning.

## 2021-08-30 NOTE — ED Triage Notes (Signed)
Pt reports CBG "over 300" at home

## 2021-08-30 NOTE — Discharge Instructions (Addendum)
I have sent medication for nausea for you to use as needed, and follow-up with both your endocrinologist and PCP about your medication adherence and overall health.

## 2021-08-31 MED ORDER — ONDANSETRON HCL 4 MG PO TABS: 4.0000 mg | ORAL_TABLET | Freq: Four times a day (QID) | ORAL | 0 refills | Status: DC

## 2021-08-31 MED ORDER — SODIUM CHLORIDE 0.9 % IV BOLUS
500.0000 mL | Freq: Once | INTRAVENOUS | Status: AC
  Administered 2021-08-31: 500 mL via INTRAVENOUS

## 2021-08-31 NOTE — ED Notes (Signed)
Pt tolerating PO challenge.

## 2021-08-31 NOTE — ED Notes (Signed)
Provided pt w/ snack for PO challenge

## 2021-09-12 ENCOUNTER — Other Ambulatory Visit: Payer: Self-pay | Admitting: Cardiology

## 2021-10-22 ENCOUNTER — Other Ambulatory Visit: Payer: Self-pay | Admitting: Cardiology

## 2022-01-27 ENCOUNTER — Other Ambulatory Visit: Payer: Self-pay | Admitting: Cardiology

## 2022-01-27 NOTE — Telephone Encounter (Signed)
Rx refill sent to pharmacy. 

## 2022-02-03 ENCOUNTER — Ambulatory Visit: Payer: Medicare Other | Admitting: Podiatry

## 2022-02-03 DIAGNOSIS — L600 Ingrowing nail: Secondary | ICD-10-CM | POA: Diagnosis not present

## 2022-02-03 DIAGNOSIS — R52 Pain, unspecified: Secondary | ICD-10-CM

## 2022-02-03 NOTE — Patient Instructions (Signed)

## 2022-02-03 NOTE — Progress Notes (Signed)
   Chief Complaint  Patient presents with   Ingrown Toenail    Rm 8  Left medial hallux x 2 weeks. Pt complains of soreness and redness. No drainage and bleeding.     Subjective: Patient presents today for evaluation of pain to the medial border of the left great toe. Patient is concerned for possible ingrown nail.  It is very sensitive to touch.  Patient presents today for further treatment and evaluation.  Past Medical History:  Diagnosis Date   Blood transfusion    d/t hemmorrhage post c- section   DEPRESSION 01/24/2009   DM 01/24/2009   H/O blood transfusion reaction 09/1992   cause PE and hemolytic type reaction per pt   History of one miscarriage    HYPERCHOLESTEROLEMIA 01/24/2009   HYPERTENSION 01/24/2009   Osteoarthritis    Pulmonary embolism ([place])    result of blood transfusion reaction 18 years ago after childbirth    Objective:  General: Well developed, nourished, in no acute distress, alert and oriented x3   Dermatology: Skin is warm, dry and supple bilateral.  Medial border left great toe is tender with evidence of an ingrowing nail. Pain on palpation noted to the border of the nail fold. The remaining nails appear unremarkable at this time. There are no open sores, lesions.  Vascular: DP and PT pulses palpable.  No clinical evidence of vascular compromise  Neruologic: Grossly intact via light touch bilateral.  Musculoskeletal: No pedal deformity noted  Assesement: #1 Paronychia with ingrowing nail medial border left great toe  Plan of Care:  1. Patient evaluated.  2. Discussed treatment alternatives and plan of care. Explained nail avulsion procedure and post procedure course to patient. 3. Patient opted for permanent partial nail avulsion of the ingrown portion of the nail.  4. Prior to procedure, local anesthesia infiltration utilized using 3 ml of a 50:50 mixture of 2% plain lidocaine and 0.5% plain marcaine in a normal hallux block fashion and a betadine  prep performed.  5. Partial permanent nail avulsion with chemical matrixectomy performed using [id number] applications of phenol followed by alcohol flush.  6. Light dressing applied.  Post care instructions provided 7.  Recommend triple antibiotic and a Band-Aid 8.  Return to clinic as needed  Edrick Kins, DPM [facility]  Dr. Edrick Kins, DPM    [address], [place] [address]                Office [phone]  Fax [phone]

## 2022-02-05 ENCOUNTER — Ambulatory Visit
Admission: RE | Admit: 2022-02-05 | Discharge: 2022-02-05 | Disposition: A | Discharge status: Home / Self Care | Payer: Medicare Other | Source: Ambulatory Visit | Attending: Internal Medicine | Admitting: Internal Medicine

## 2022-02-05 DIAGNOSIS — Z122 Encounter for screening for malignant neoplasm of respiratory organs: Secondary | ICD-10-CM

## 2022-02-24 ENCOUNTER — Other Ambulatory Visit: Payer: Self-pay | Admitting: Cardiology

## 2022-05-04 ENCOUNTER — Encounter: Payer: Self-pay | Admitting: Surgical

## 2022-05-04 ENCOUNTER — Ambulatory Visit: Payer: Medicare Other | Admitting: Surgical

## 2022-05-04 ENCOUNTER — Ambulatory Visit: Payer: Self-pay

## 2022-05-04 DIAGNOSIS — M25561 Pain in right knee: Secondary | ICD-10-CM

## 2022-05-04 DIAGNOSIS — M1711 Unilateral primary osteoarthritis, right knee: Secondary | ICD-10-CM | POA: Diagnosis not present

## 2022-05-04 MED ORDER — LIDOCAINE HCL 1 % IJ SOLN
5.0000 mL | INTRAMUSCULAR | Status: AC | PRN
  Administered 2022-05-04: 5 mL

## 2022-05-04 MED ORDER — BUPIVACAINE HCL 0.25 % IJ SOLN
4.0000 mL | INTRAMUSCULAR | Status: AC | PRN
  Administered 2022-05-04: 4 mL via INTRA_ARTICULAR

## 2022-05-04 MED ORDER — METHYLPREDNISOLONE ACETATE 40 MG/ML IJ SUSP
40.0000 mg | INTRAMUSCULAR | Status: AC | PRN
  Administered 2022-05-04: 40 mg via INTRA_ARTICULAR

## 2022-05-04 MED ORDER — MELOXICAM 15 MG PO TABS: 15.0000 mg | ORAL_TABLET | Freq: Every day | ORAL | 0 refills | Status: DC | PRN

## 2022-05-04 NOTE — Progress Notes (Signed)
Office Visit Note   Patient: Chelsea Gray           Date of Birth: 16-Feb-1956           MRN: [id number] Visit Date: 05/04/2022 Requested by: Kelton Pillar, MD [address] [place],  [place] [address] PCP: Kelton Pillar, MD  Subjective: Chief Complaint  Patient presents with   Right Knee - Pain    HPI: Chelsea Gray is a 67 y.o. female who presents to the office reporting right knee pain.  Patient states that last Wednesday she was walking and all of a sudden felt increased pain in the right knee.  She localizes pain to the anterior lateral aspect of the knee.  She has tried Tylenol, Aspercreme, warm compress without pain relief.  Pain has been worsening over the last several days.  It is causing her to wake with pain at night.  She does have history of left total knee arthroplasty by Dr. Marlou Sa in 2013 that is doing very well for her.  No history of prior right knee surgery.  No history of gout.  No fevers or chills.  Does have history of diabetes with last A1c around 7 according to her.  No groin pain.  No numbness or tingling.              ROS: All systems reviewed are negative as they relate to the chief complaint within the history of present illness.  Patient denies fevers or chills.  Assessment & Plan: Visit Diagnoses:  1. Primary osteoarthritis of right knee   2. Right knee pain, unspecified chronicity     Plan: Patient is a 67 year old female who presents for evaluation of right knee pain.  She notes onset last Wednesday without injury.  She has had difficulty sleeping at night due to pain.  She is ambulating without significant antalgia today.  Radiographs taken today demonstrate no fracture or dislocation but do show she has peripheral osteophytes with fairly well-preserved joint space of the right knee.  Primarily she has osteophytes in the posterior aspect of the lateral compartment.  After discussion of options, she would like to try injection.  Right  knee injection was successfully administered and patient tolerated procedure well.  Prescribed meloxicam to see if this will provide more relief than the Tylenol.  She will follow-up with the office in 4 weeks but call our office in 2 weeks if she has no improvement from injection and neck step would be consideration of MRI scan to better determine the extent of arthritis in the knee versus any acute injury.  Follow-Up Instructions: Return in about 4 weeks (around 06/01/2022).   Orders:  Orders Placed This Encounter  Procedures   XR KNEE 3 VIEW RIGHT   No orders of the defined types were placed in this encounter.     Procedures: Large Joint Inj: R knee on 05/04/2022 5:20 PM Indications: diagnostic evaluation, joint swelling and pain Details: 18 G 1.5 in needle, superolateral approach  Arthrogram: No  Medications: 5 mL lidocaine 1 %; 40 mg methylPREDNISolone acetate 40 MG/ML; 4 mL bupivacaine 0.25 % Aspirate: 2 mL blood-tinged Outcome: tolerated well, no immediate complications Procedure, treatment alternatives, risks and benefits explained, specific risks discussed. Consent was given by the patient. Immediately prior to procedure a time out was called to verify the correct patient, procedure, equipment, support staff and site/side marked as required. Patient was prepped and draped in the usual sterile fashion.  Clinical Data: No additional findings.  Objective: Vital Signs: There were no vitals taken for this visit.  Physical Exam:  Constitutional: Patient appears well-developed HEENT:  Head: Normocephalic Eyes:EOM are normal Neck: Normal range of motion Cardiovascular: Normal rate Pulmonary/chest: Effort normal Neurologic: Patient is alert Skin: Skin is warm Psychiatric: Patient has normal mood and affect  Ortho Exam: Ortho exam demonstrates right knee with 0 degrees extension and 120 degrees of knee flexion.  Trace effusion noted.  Tenderness over the lateral joint  line.  No tenderness over the proximal tibia.  No tenderness over the medial joint line.  Able to form straight leg raise without extensor lag.  No pain with hip range of motion.  No cellulitis or skin changes noted.   Specialty Comments:  No specialty comments available.  Imaging: No results found.   PMFS History: Patient Active Problem List   Diagnosis Date Noted   Hyperthyroidism 11/07/2019   Dyslipidemia 11/07/2019   Chest pain 10/08/2019   Chest pain with moderate risk for cardiac etiology 10/07/2019   Abdominal pain, unspecified site 12/24/2010   MYALGIA 06/19/2009   NUMBNESS 06/19/2009   Non-insulin dependent type 2 diabetes mellitus (HCC) 01/24/2009   HYPERCHOLESTEROLEMIA 01/24/2009   SMOKER 01/24/2009   DEPRESSION 01/24/2009   Essential hypertension 01/24/2009   CHEST PAIN 01/24/2009   Neuropathy 01/24/2009   Iron deficiency anemia due to chronic blood loss 01/24/2009   Past Medical History:  Diagnosis Date   Blood transfusion    d/t hemmorrhage post c- section   DEPRESSION 01/24/2009   DM 01/24/2009   H/O blood transfusion reaction 09/1992   cause PE and hemolytic type reaction per pt   History of one miscarriage    HYPERCHOLESTEROLEMIA 01/24/2009   HYPERTENSION 01/24/2009   Osteoarthritis    Pulmonary embolism (HCC)    result of blood transfusion reaction 18 years ago after childbirth    Family History  Problem Relation Age of Onset   Diabetes Mother    Hypertension Mother    Diabetes Father    Arrhythmia Father    Heart disease Father        CABG   Breast cancer Sister    Diabetes Sister    Anesthesia problems Neg Hx     Past Surgical History:  Procedure Laterality Date   BIOPSY  10/09/2019   Procedure: BIOPSY;  Surgeon: Kerin Salen, MD;  Location: [facility] ENDOSCOPY;  Service: Gastroenterology;;   BREAST CYST EXCISION     BREAST REDUCTION SURGERY     CERVICAL CERCLAGE     CESAREAN SECTION     COLONOSCOPY WITH PROPOFOL N/A 10/09/2019   Procedure:  COLONOSCOPY WITH PROPOFOL;  Surgeon: Kerin Salen, MD;  Location: [facility] ENDOSCOPY;  Service: Gastroenterology;  Laterality: N/A;   ESOPHAGOGASTRODUODENOSCOPY (EGD) WITH PROPOFOL N/A 10/09/2019   Procedure: ESOPHAGOGASTRODUODENOSCOPY (EGD) WITH PROPOFOL;  Surgeon: Kerin Salen, MD;  Location: [facility] ENDOSCOPY;  Service: Gastroenterology;  Laterality: N/A;   HEMOSTASIS CLIP PLACEMENT  10/09/2019   Procedure: HEMOSTASIS CLIP PLACEMENT;  Surgeon: Kerin Salen, MD;  Location: [facility] ENDOSCOPY;  Service: Gastroenterology;;   HOT HEMOSTASIS N/A 10/09/2019   Procedure: HOT HEMOSTASIS (ARGON PLASMA COAGULATION/BICAP);  Surgeon: Kerin Salen, MD;  Location[facility] ENDOSCOPY;  Service: Gastroenterology;  Laterality: N/A;   JOINT REPLACEMENT     KNEE ARTHROPLASTY  07/14/2011   Procedure: COMPUTER ASSISTED TOTAL KNEE ARTHROPLASTY;  Surgeon: Cammy Copa, MD;  Location: [facility] OR;  Service: Orthopedics;  Laterality: Left;  Left total knee arthroplasty   POLYPECTOMY  10/09/2019   Procedure: POLYPECTOMY;  Surgeon: Ronnette Juniper, MD;  Location: [facility] ENDOSCOPY;  Service: Gastroenterology;;   REDUCTION MAMMAPLASTY Bilateral    Social History   Occupational History   Occupation: Optician, dispensing: [employer]  Tobacco Use   Smoking status: Every Day    Packs/day: 0.10    Years: 6.00    Total pack years: 0.60    Types: Cigarettes   Smokeless tobacco: Never  Substance and Sexual Activity   Alcohol use: Yes    Comment: once drink every few months   Drug use: No   Sexual activity: Not Currently

## 2022-05-23 ENCOUNTER — Other Ambulatory Visit: Payer: Self-pay | Admitting: Cardiology

## 2022-06-01 ENCOUNTER — Other Ambulatory Visit: Payer: Self-pay | Admitting: Surgical

## 2022-06-20 ENCOUNTER — Other Ambulatory Visit: Payer: Self-pay | Admitting: Cardiology

## 2022-06-30 ENCOUNTER — Other Ambulatory Visit: Payer: Self-pay | Admitting: Surgical

## 2022-09-28 ENCOUNTER — Other Ambulatory Visit: Payer: Self-pay | Admitting: Internal Medicine

## 2022-09-28 DIAGNOSIS — Z1231 Encounter for screening mammogram for malignant neoplasm of breast: Secondary | ICD-10-CM

## 2022-09-29 ENCOUNTER — Telehealth: Payer: Self-pay | Admitting: Orthopedic Surgery

## 2022-09-29 NOTE — Telephone Encounter (Signed)
Pt called about her medication being called in for dental appt Thursday. Please send to pharmacy on file. Pt phone number is [phone].

## 2022-09-29 NOTE — Telephone Encounter (Signed)
Patient called advised she is having her teeth cleaned and will need an antibiotic sent over to [facility] on [address].    Patient said she will also need a Rx for a yeast infection. Patient said she always get a yeast infection after taking an antibiotic. Patient asked if a note can be faxed over to the dentist office   Dr. Elder Cyphers Office  stating she will need an antibiotic for her lifetime.  The fax number to the dentist office is  Fax# is [phone]      The number to contact patient is [phone]

## 2022-09-29 NOTE — Telephone Encounter (Signed)
Message already sent to [facility]

## 2022-09-30 ENCOUNTER — Ambulatory Visit
Admission: RE | Admit: 2022-09-30 | Discharge: 2022-09-30 | Disposition: A | Discharge status: Home / Self Care | Payer: Medicare Other | Source: Ambulatory Visit | Attending: Internal Medicine | Admitting: Internal Medicine

## 2022-09-30 ENCOUNTER — Other Ambulatory Visit: Payer: Self-pay | Admitting: Surgical

## 2022-09-30 ENCOUNTER — Telehealth: Payer: Self-pay | Admitting: Orthopedic Surgery

## 2022-09-30 DIAGNOSIS — Z1231 Encounter for screening mammogram for malignant neoplasm of breast: Secondary | ICD-10-CM

## 2022-09-30 MED ORDER — AMOXICILLIN 500 MG PO TABS: 2000.0000 mg | ORAL_TABLET | Freq: Once | ORAL | 0 refills | Status: AC

## 2022-09-30 NOTE — Telephone Encounter (Signed)
Sent in. Need more than <12 hours notice next time. She needs to give [place] a few days, not call on the day of her appointment

## 2022-09-30 NOTE — Telephone Encounter (Signed)
Pt called requesting a script of pre meds for dental appt. Pt states she had to cancel twice because no pre meds was sent. Please [facility] to [facility] on [address]. Pt phone number is [phone].

## 2022-10-01 NOTE — Telephone Encounter (Signed)
Called and advised pt.

## 2023-03-12 ENCOUNTER — Other Ambulatory Visit: Payer: Self-pay | Admitting: Internal Medicine

## 2023-03-12 DIAGNOSIS — Z122 Encounter for screening for malignant neoplasm of respiratory organs: Secondary | ICD-10-CM

## 2023-03-12 DIAGNOSIS — Z13820 Encounter for screening for osteoporosis: Secondary | ICD-10-CM

## 2023-03-17 ENCOUNTER — Encounter: Payer: Self-pay | Admitting: Internal Medicine

## 2023-04-05 ENCOUNTER — Ambulatory Visit
Admission: RE | Admit: 2023-04-05 | Discharge: 2023-04-05 | Disposition: A | Discharge status: Home / Self Care | Payer: Medicare Other | Source: Ambulatory Visit | Attending: Internal Medicine | Admitting: Internal Medicine

## 2023-04-05 DIAGNOSIS — Z122 Encounter for screening for malignant neoplasm of respiratory organs: Secondary | ICD-10-CM

## 2023-04-21 ENCOUNTER — Other Ambulatory Visit (HOSPITAL_COMMUNITY): Payer: Self-pay | Admitting: Gastroenterology

## 2023-04-21 DIAGNOSIS — R1110 Vomiting, unspecified: Secondary | ICD-10-CM

## 2023-04-30 ENCOUNTER — Encounter (HOSPITAL_COMMUNITY)
Admission: RE | Admit: 2023-04-30 | Discharge: 2023-04-30 | Disposition: A | Discharge status: Home / Self Care | Payer: Medicare Other | Source: Ambulatory Visit | Attending: Gastroenterology | Admitting: Gastroenterology

## 2023-04-30 DIAGNOSIS — R1110 Vomiting, unspecified: Secondary | ICD-10-CM | POA: Insufficient documentation

## 2023-04-30 MED ORDER — TECHNETIUM TC 99M SULFUR COLLOID
2.2400 | Freq: Once | INTRAVENOUS | Status: AC
  Administered 2023-04-30: 2.24 via ORAL

## 2023-05-18 ENCOUNTER — Telehealth: Payer: Self-pay | Admitting: Orthopedic Surgery

## 2023-05-18 MED ORDER — AMOXICILLIN 500 MG PO CAPS: ORAL_CAPSULE | ORAL | 0 refills | Status: AC

## 2023-05-18 NOTE — Telephone Encounter (Signed)
I called patient, note entered, faxed, and put up front for her to pick up. Amoxicillin also sent to pharmacy per protocol.

## 2023-05-18 NOTE — Telephone Encounter (Signed)
Patient called advised she is having dental surgery Thursday and will need a note faxed over to the dental office.    Patient said she will also pick up a copy of the note today. Patient asked if she can get a call when note is ready for pick  up.  Dr. Elder Cyphers    Fax # [phone]    The number to contact patient is [phone]

## 2023-08-12 ENCOUNTER — Telehealth: Payer: Self-pay | Admitting: Internal Medicine

## 2023-08-12 NOTE — Telephone Encounter (Signed)
 I have received a phone call from this patient to schedule her 6 month follow up [facility]. It looks like Dr. Kathrine Paris has been the provider following her. She has not seen  Pulmonary and has not had a Shared Decision Making Visit with us . Can someone from your office call and schedule for this patient?

## 2023-08-20 ENCOUNTER — Telehealth: Payer: Self-pay | Admitting: Radiology

## 2023-08-20 ENCOUNTER — Encounter: Payer: Self-pay | Admitting: Surgical

## 2023-08-20 ENCOUNTER — Ambulatory Visit: Admitting: Surgical

## 2023-08-20 DIAGNOSIS — M1711 Unilateral primary osteoarthritis, right knee: Secondary | ICD-10-CM

## 2023-08-20 MED ORDER — TRIAMCINOLONE ACETONIDE 40 MG/ML IJ SUSP
40.0000 mg | INTRAMUSCULAR | Status: AC | PRN
Start: 2023-08-20 — End: 2023-08-20
  Administered 2023-08-20: 40 mg via INTRA_ARTICULAR

## 2023-08-20 MED ORDER — LIDOCAINE HCL 1 % IJ SOLN
5.0000 mL | INTRAMUSCULAR | Status: AC | PRN
Start: 2023-08-20 — End: 2023-08-20
  Administered 2023-08-20: 5 mL

## 2023-08-20 MED ORDER — BUPIVACAINE HCL 0.25 % IJ SOLN
4.0000 mL | INTRAMUSCULAR | Status: AC | PRN
Start: 2023-08-20 — End: 2023-08-20
  Administered 2023-08-20: 4 mL via INTRA_ARTICULAR

## 2023-08-20 NOTE — Progress Notes (Signed)
 Office Visit Note   Patient: Chelsea Gray           Date of Birth: [birth year]           MRN: [id number] Visit Date: 08/20/2023 Requested by: No referring provider defined for this encounter. PCP: Norlene Beavers, MD (Inactive)  Subjective: Chief Complaint  Patient presents with   Right Knee - Pain    HPI: Chelsea Gray is a 68 y.o. female who presents to the office reporting knee pain.  Has history of knee arthritis.  No new falls or injuries.  No fevers or chills.  Previous injections have provided good relief and they are here today to repeat injections.  Last right knee injection on 05/04/2022 helped but only lasted for several months.  She has throbbing sensation and occasional buckling of the knee.  No new injury.  She will have occasional popping sensation on the lateral aspect of the knee.  She wants to discuss options of anything she can do short of surgery..                ROS: All systems reviewed are negative as they relate to the chief complaint within the history of present illness.  Patient denies fevers or chills.  Assessment & Plan: Visit Diagnoses:  1. Primary osteoarthritis of right knee     Plan: Patient is a 68 year old female who presents for evaluation of right knee pain.  She has history of right knee arthritis that is more impressive in terms of the osteophyte formation rather than actual joint space narrowing.  We discussed options available to patient and she had several months of relief with cortisone injection.  She would like to repeat this and try gel injection in the future after cortisone wears off.  Cortisone injection administered today.  Will get her preapproved for right knee gel injection.  We also discussed stationary bike and walking exercise for her knees as well as nonload bearing quadricep strengthening exercises and IT band stretching exercises.  This patient is diagnosed with osteoarthritis of the knee(s).    Radiographs show evidence of  joint space narrowing, osteophytes, subchondral sclerosis and/or subchondral cysts.  This patient has knee pain which interferes with functional and activities of daily living.    This patient has experienced inadequate response, adverse effects and/or intolerance with conservative treatments such as acetaminophen , NSAIDS, topical creams, physical therapy or regular exercise, knee bracing and/or weight loss.   This patient has experienced inadequate response or has a contraindication to intra articular steroid injections for at least 3 months.   This patient is not scheduled to have a total knee replacement within 6 months of starting treatment with viscosupplementation.   Follow-Up Instructions: No follow-ups on file.   Orders:  No orders of the defined types were placed in this encounter.  No orders of the defined types were placed in this encounter.     Procedures: Large Joint Inj: R knee on 08/20/2023 2:14 PM Indications: diagnostic evaluation, joint swelling and pain Details: 18 G 1.5 in needle, superolateral approach  Arthrogram: No  Medications: 5 mL lidocaine  1 %; 4 mL bupivacaine  0.25 %; 40 mg triamcinolone acetonide 40 MG/ML Outcome: tolerated well, no immediate complications Procedure, treatment alternatives, risks and benefits explained, specific risks discussed. Consent was given by the patient. Immediately prior to procedure a time out was called to verify the correct patient, procedure, equipment, support staff and site/side marked as required. Patient was prepped and draped in the  usual sterile fashion.       Clinical Data: No additional findings.  Objective: Vital Signs: There were no vitals taken for this visit.  Physical Exam:  Constitutional: Patient appears well-developed HEENT:  Head: Normocephalic Eyes:EOM are normal Neck: Normal range of motion Cardiovascular: Normal rate Pulmonary/chest: Effort normal Neurologic: Patient is alert Skin: Skin is  warm Psychiatric: Patient has normal mood and affect  Ortho Exam: Ortho exam demonstrates knees without cellulitis or skin changes.  Effusion trace.  No calf tenderness.  Negative Homans' sign.  No pain with hip range of motion.  Able to perform straight leg raise with both lower extremities.  Lower extremities warm and well-perfused.  Moderate medial and lateral joint line tenderness.  Popping sensation noted over the lateral joint line with passive flexion.  Specialty Comments:  No specialty comments available.  Imaging: No results found.   PMFS History: Patient Active Problem List   Diagnosis Date Noted   Hyperthyroidism 11/07/2019   Dyslipidemia 11/07/2019   Chest pain 10/08/2019   Chest pain with moderate risk for cardiac etiology 10/07/2019   Abdominal pain 12/24/2010   MYALGIA 06/19/2009   NUMBNESS 06/19/2009   Non-insulin  dependent type 2 diabetes mellitus (HCC) 01/24/2009   HYPERCHOLESTEROLEMIA 01/24/2009   SMOKER 01/24/2009   DEPRESSION 01/24/2009   Essential hypertension 01/24/2009   CHEST PAIN 01/24/2009   Neuropathy 01/24/2009   Iron deficiency anemia due to chronic blood loss 01/24/2009   Past Medical History:  Diagnosis Date   Blood transfusion    d/t hemmorrhage post c- section   DEPRESSION 01/24/2009   DM 01/24/2009   H/O blood transfusion reaction 09/1992   cause PE and hemolytic type reaction per pt   History of one miscarriage    HYPERCHOLESTEROLEMIA 01/24/2009   HYPERTENSION 01/24/2009   Osteoarthritis    Pulmonary embolism (HCC)    result of blood transfusion reaction 18 years ago after childbirth    Family History  Problem Relation Age of Onset   Diabetes Mother    Hypertension Mother    Diabetes Father    Arrhythmia Father    Heart disease Father        CABG   Breast cancer Sister    Diabetes Sister    Anesthesia problems Neg Hx     Past Surgical History:  Procedure Laterality Date   BIOPSY  10/09/2019   Procedure: BIOPSY;  Surgeon:  Genell Ken, MD;  Location: [facility] ENDOSCOPY;  Service: Gastroenterology;;   BREAST CYST EXCISION     BREAST REDUCTION SURGERY     CERVICAL CERCLAGE     CESAREAN SECTION     COLONOSCOPY WITH PROPOFOL  N/A 10/09/2019   Procedure: COLONOSCOPY WITH PROPOFOL ;  Surgeon: Genell Ken, MD;  Location: [facility] ENDOSCOPY;  Service: Gastroenterology;  Laterality: N/A;   ESOPHAGOGASTRODUODENOSCOPY (EGD) WITH PROPOFOL  N/A 10/09/2019   Procedure: ESOPHAGOGASTRODUODENOSCOPY (EGD) WITH PROPOFOL ;  Surgeon: Genell Ken, MD;  Location[facility] ENDOSCOPY;  Service: Gastroenterology;  Laterality: N/A;   HEMOSTASIS CLIP PLACEMENT  10/09/2019   Procedure: HEMOSTASIS CLIP PLACEMENT;  Surgeon: Genell Ken, MD;  Location: [facility] ENDOSCOPY;  Service: Gastroenterology;;   HOT HEMOSTASIS N/A 10/09/2019   Procedure: HOT HEMOSTASIS (ARGON PLASMA COAGULATION/BICAP);  Surgeon: Genell Ken, MD;  Location: [facility] ENDOSCOPY;  Service: Gastroenterology;  Laterality: N/A;   JOINT REPLACEMENT     KNEE ARTHROPLASTY  07/14/2011   Procedure: COMPUTER ASSISTED TOTAL KNEE ARTHROPLASTY;  Surgeon: Jasmine Mesi, MD;  Location: [facility] OR;  Service: Orthopedics;  Laterality: Left;  Left total knee  arthroplasty   POLYPECTOMY  10/09/2019   Procedure: POLYPECTOMY;  Surgeon: Genell Ken, MD;  Location: [facility] ENDOSCOPY;  Service: Gastroenterology;;   REDUCTION MAMMAPLASTY Bilateral    Social History   Occupational History   Occupation: Neurosurgeon: [employer]  Tobacco Use   Smoking status: Every Day    Current packs/day: 0.10    Average packs/day: 0.1 packs/day for 6.0 years (0.6 ttl pk-yrs)    Types: Cigarettes   Smokeless tobacco: Never  Substance and Sexual Activity   Alcohol use: Yes    Comment: once drink every few months   Drug use: No   Sexual activity: Not Currently

## 2023-08-20 NOTE — Telephone Encounter (Signed)
 Could you please obtain authorization for right knee gel injection-Luke? Thanks.

## 2023-09-01 ENCOUNTER — Other Ambulatory Visit: Payer: Self-pay | Admitting: Internal Medicine

## 2023-09-01 DIAGNOSIS — Z1231 Encounter for screening mammogram for malignant neoplasm of breast: Secondary | ICD-10-CM

## 2023-09-02 ENCOUNTER — Other Ambulatory Visit: Payer: Self-pay | Admitting: Internal Medicine

## 2023-09-02 DIAGNOSIS — R911 Solitary pulmonary nodule: Secondary | ICD-10-CM

## 2023-09-02 DIAGNOSIS — R634 Abnormal weight loss: Secondary | ICD-10-CM

## 2023-09-06 NOTE — Telephone Encounter (Signed)
 Submitted online [id number], Durolane.

## 2023-09-09 ENCOUNTER — Other Ambulatory Visit

## 2023-09-09 ENCOUNTER — Ambulatory Visit
Admission: RE | Admit: 2023-09-09 | Discharge: 2023-09-09 | Disposition: A | Discharge status: Home / Self Care | Source: Ambulatory Visit | Attending: Internal Medicine | Admitting: Internal Medicine

## 2023-09-09 DIAGNOSIS — R634 Abnormal weight loss: Secondary | ICD-10-CM

## 2023-09-13 ENCOUNTER — Ambulatory Visit
Admission: RE | Admit: 2023-09-13 | Discharge: 2023-09-13 | Disposition: A | Discharge status: Home / Self Care | Source: Ambulatory Visit | Attending: Internal Medicine

## 2023-09-13 ENCOUNTER — Ambulatory Visit (HOSPITAL_BASED_OUTPATIENT_CLINIC_OR_DEPARTMENT_OTHER)

## 2023-09-13 MED ORDER — IOPAMIDOL (ISOVUE-370) INJECTION 76%
80.0000 mL | Freq: Once | INTRAVENOUS | Status: AC | PRN
  Administered 2023-09-13: 80 mL via INTRAVENOUS

## 2023-09-16 ENCOUNTER — Other Ambulatory Visit: Payer: Self-pay

## 2023-09-16 DIAGNOSIS — M1711 Unilateral primary osteoarthritis, right knee: Secondary | ICD-10-CM

## 2023-09-17 ENCOUNTER — Ambulatory Visit: Admitting: Orthopedic Surgery

## 2023-09-17 ENCOUNTER — Encounter: Payer: Self-pay | Admitting: Orthopedic Surgery

## 2023-09-17 DIAGNOSIS — M1711 Unilateral primary osteoarthritis, right knee: Secondary | ICD-10-CM | POA: Diagnosis not present

## 2023-09-17 DIAGNOSIS — M25561 Pain in right knee: Secondary | ICD-10-CM

## 2023-09-17 NOTE — Progress Notes (Unsigned)
   Procedure Note  Patient: Chelsea Gray             Date of Birth: Oct 10, 1955           MRN: [id number]             Visit Date: 09/17/2023  Procedures: Visit Diagnoses:  1. Right knee pain, unspecified chronicity     Large Joint Inj: R knee on 09/17/2023 6:06 PM Indications: diagnostic evaluation, joint swelling and pain Details: 18 G 1.5 in needle, superolateral approach  Arthrogram: No  Medications: 5 mL lidocaine  1 %; 60 mg Sodium Hyaluronate 60 MG/3ML Outcome: tolerated well, no immediate complications Procedure, treatment alternatives, risks and benefits explained, specific risks discussed. Consent was given by the patient. Immediately prior to procedure a time out was called to verify the correct patient, procedure, equipment, support staff and site/side marked as required. Patient was prepped and draped in the usual sterile fashion.     Lot #[id number].  Patient reports continued right knee pain despite over 2 months of conservative treatment including prior cortisone shot anti-inflammatories as well as physical therapy type nonweightbearing quad strengthening exercises.  Very mild arthritis present on plain radiographs with no spurring.  Gel injection performed today.  MRI scan of the right knee is indicated to evaluate the medial sided pain and mechanical symptoms which have not responded to conservative treatment.  This does not look like a total knee replacement type of situation but she does describe definite giving way mechanical symptoms which could be consistent with unstable meniscal pathology which could very well be improved with arthroscopic intervention.  MRI indicated in this case to evaluate both menisci.

## 2023-09-18 MED ORDER — SODIUM HYALURONATE 60 MG/3ML IX PRSY
60.0000 mg | PREFILLED_SYRINGE | INTRA_ARTICULAR | Status: AC | PRN
Start: 2023-09-17 — End: 2023-09-17
  Administered 2023-09-17: 60 mg via INTRA_ARTICULAR

## 2023-09-18 MED ORDER — LIDOCAINE HCL 1 % IJ SOLN
5.0000 mL | INTRAMUSCULAR | Status: AC | PRN
Start: 2023-09-17 — End: 2023-09-17
  Administered 2023-09-17: 5 mL

## 2023-10-01 ENCOUNTER — Ambulatory Visit
Admission: RE | Admit: 2023-10-01 | Discharge: 2023-10-01 | Disposition: A | Discharge status: Home / Self Care | Source: Ambulatory Visit | Attending: Internal Medicine | Admitting: Internal Medicine

## 2023-10-01 DIAGNOSIS — Z1231 Encounter for screening mammogram for malignant neoplasm of breast: Secondary | ICD-10-CM

## 2023-10-05 ENCOUNTER — Ambulatory Visit
Admission: RE | Admit: 2023-10-05 | Discharge: 2023-10-05 | Disposition: A | Discharge status: Home / Self Care | Source: Ambulatory Visit | Attending: Internal Medicine | Admitting: Internal Medicine

## 2023-10-05 DIAGNOSIS — R911 Solitary pulmonary nodule: Secondary | ICD-10-CM

## 2023-10-14 ENCOUNTER — Encounter: Payer: Self-pay | Admitting: *Deleted

## 2023-10-15 ENCOUNTER — Ambulatory Visit: Admitting: Orthopedic Surgery

## 2023-10-15 ENCOUNTER — Telehealth: Payer: Self-pay

## 2023-10-15 NOTE — Telephone Encounter (Signed)
 I called and talked with patient. She was to see Dr Addie today to review an MRI but had not gotten the MRI yet. Provided [place] imaging number for her to call and schedule an appt. She will reach out to me via mychart once scheduled and Dr Addie will call her with results.

## 2023-10-31 ENCOUNTER — Emergency Department (HOSPITAL_COMMUNITY)

## 2023-10-31 ENCOUNTER — Other Ambulatory Visit: Payer: Self-pay

## 2023-10-31 ENCOUNTER — Observation Stay (HOSPITAL_COMMUNITY)
Admission: EM | Admit: 2023-10-31 | Discharge: 2023-11-01 | Disposition: A | Discharge status: Home / Self Care | Attending: Internal Medicine | Admitting: Internal Medicine

## 2023-10-31 DIAGNOSIS — E119 Type 2 diabetes mellitus without complications: Secondary | ICD-10-CM | POA: Diagnosis not present

## 2023-10-31 DIAGNOSIS — R911 Solitary pulmonary nodule: Secondary | ICD-10-CM | POA: Diagnosis not present

## 2023-10-31 DIAGNOSIS — E785 Hyperlipidemia, unspecified: Secondary | ICD-10-CM | POA: Diagnosis present

## 2023-10-31 DIAGNOSIS — Z8709 Personal history of other diseases of the respiratory system: Secondary | ICD-10-CM | POA: Insufficient documentation

## 2023-10-31 DIAGNOSIS — E872 Acidosis, unspecified: Principal | ICD-10-CM

## 2023-10-31 DIAGNOSIS — I10 Essential (primary) hypertension: Secondary | ICD-10-CM | POA: Diagnosis present

## 2023-10-31 DIAGNOSIS — E8729 Other acidosis: Secondary | ICD-10-CM | POA: Diagnosis present

## 2023-10-31 DIAGNOSIS — Z79899 Other long term (current) drug therapy: Secondary | ICD-10-CM | POA: Diagnosis not present

## 2023-10-31 DIAGNOSIS — E876 Hypokalemia: Principal | ICD-10-CM | POA: Diagnosis present

## 2023-10-31 DIAGNOSIS — R0602 Shortness of breath: Secondary | ICD-10-CM | POA: Diagnosis present

## 2023-10-31 DIAGNOSIS — Z7982 Long term (current) use of aspirin: Secondary | ICD-10-CM | POA: Insufficient documentation

## 2023-10-31 DIAGNOSIS — J449 Chronic obstructive pulmonary disease, unspecified: Secondary | ICD-10-CM | POA: Diagnosis not present

## 2023-10-31 DIAGNOSIS — F1090 Alcohol use, unspecified, uncomplicated: Secondary | ICD-10-CM | POA: Insufficient documentation

## 2023-10-31 DIAGNOSIS — F17210 Nicotine dependence, cigarettes, uncomplicated: Secondary | ICD-10-CM | POA: Insufficient documentation

## 2023-10-31 DIAGNOSIS — Z794 Long term (current) use of insulin: Secondary | ICD-10-CM | POA: Insufficient documentation

## 2023-10-31 DIAGNOSIS — R918 Other nonspecific abnormal finding of lung field: Secondary | ICD-10-CM | POA: Diagnosis present

## 2023-10-31 LAB — BASIC METABOLIC PANEL WITH GFR
Anion gap: 17 — ABNORMAL HIGH (ref 5–15)
BUN: 14 mg/dL (ref 8–23)
CO2: 13 mmol/L — ABNORMAL LOW (ref 22–32)
Calcium: 8.3 mg/dL — ABNORMAL LOW (ref 8.9–10.3)
Chloride: 108 mmol/L (ref 98–111)
Creatinine, Ser: 0.89 mg/dL (ref 0.44–1.00)
GFR, Estimated: >60 mL/min (ref >60)
Glucose, Bld: 175 mg/dL — ABNORMAL HIGH (ref 70–99)
Potassium: 2.9 mmol/L — ABNORMAL LOW (ref 3.5–5.1)
Sodium: 138 mmol/L (ref 135–145)

## 2023-10-31 NOTE — ED Provider Notes (Signed)
 [place] EMERGENCY DEPARTMENT AT [facility] Provider Note   CSN: [id number] Arrival date & time: 10/31/23  2253     Patient presents with: Nausea, Shortness of Breath, and Palpitations   Chelsea Gray is a 68 y.o. female.  {Add pertinent medical, surgical, social history, OB history to YEP:67052} The history is provided by the patient.  Shortness of Breath Palpitations Associated symptoms: shortness of breath    She has a history of diabetes, hypertension, hyperlipidemia and comes in because of shortness of breath which started about 2 hours ago.  Dyspnea came on suddenly.  She denies cough, fever, chills.  She denies chest pain, heaviness, tightness, pressure.  She has had some nausea during the day and had been given a dose of ondansetron  with improvement in nausea.  She still is feeling dyspneic.    Prior to Admission medications   Medication Sig Start Date End Date Taking? Authorizing Provider  amoxicillin  (AMOXIL ) 500 MG capsule Take 4 capsules (2000 mg total) by mouth one hour prior to dental procedure. 05/18/23   Addie Cordella Hamilton, MD  aspirin  81 MG chewable tablet Chew 1 tablet (81 mg total) by mouth daily. 10/11/19   Regalado, Belkys A, MD  atorvastatin  (LIPITOR ) 40 MG tablet Take 40 mg by mouth daily. 07/13/19   [provider]  empagliflozin (JARDIANCE) 10 MG TABS tablet TAKE 1 TABLET BY MOUTH EVERY DAY FOR 30 DAYS    [provider]  ferrous sulfate  325 (65 FE) MG tablet Take 1 tablet (325 mg total) by mouth daily. 10/10/19 10/09/20  Regalado, Owen LABOR, MD  FREESTYLE LITE test strip CHECK BLOOD SUGAR ONCE A DAY Patient taking differently: 1 each by Other route daily.  08/27/15   Kassie Mallick, MD  meloxicam  (MOBIC ) 15 MG tablet TAKE 1 TABLET BY MOUTH EVERY DAY AS NEEDED FOR PAIN 06/30/22   Magnant, Carlin CROME, PA-C  metFORMIN  (GLUCOPHAGE ) 500 MG tablet Take 1 tablet (500 mg total) by mouth 2 (two) times daily with a meal. Patient taking  differently: Take 500 mg by mouth daily with breakfast.  12/15/16   Kassie Mallick, MD  methimazole  (TAPAZOLE ) 10 MG tablet Take 2 tablets (20 mg total) by mouth daily. 10/11/19   Regalado, Belkys A, MD  metoprolol  tartrate (LOPRESSOR ) 25 MG tablet TAKE 1 TABLET 2 TIMES DAILY. PATIENT MUST MAKE APPOINTMENT FOR FUTURE REFILLS FIRST ATTEMPT 05/25/22   Pietro Redell RAMAN, MD  nitroGLYCERIN  (NITROSTAT ) 0.4 MG SL tablet Place 1 tablet (0.4 mg total) under the tongue every 5 (five) minutes as needed for chest pain. 10/10/19 10/09/20  Regalado, Belkys A, MD  NOVOTWIST 32G X 5 MM MISC USE DAILY FOR INJECTION Patient taking differently: 1 each by Other route daily.  05/21/15   Kassie Mallick, MD  olmesartan  (BENICAR ) 40 MG tablet Take 40 mg by mouth daily.     [provider]  ondansetron  (ZOFRAN ) 4 MG tablet Take 1 tablet (4 mg total) by mouth every 6 (six) hours. 08/31/21   Redwine, Madison A, PA-C  pantoprazole  (PROTONIX ) 40 MG tablet Take 1 tablet (40 mg total) by mouth daily. 10/11/19   Regalado, Belkys A, MD  prednisoLONE acetate (PRED FORTE) 1 % ophthalmic suspension SMARTSIG:1 Drop(s) In Eye(s) 03/27/21   [provider]  TRULICITY  1.5 MG/0.5ML SOPN INJECT 1.5 MG INTO THE SKIN ONCE A WEEK. 09/21/16   Kassie Mallick, MD    Allergies: Codeine sulfate    Review of Systems  Respiratory:  Positive for shortness of  breath.   Cardiovascular:  Positive for palpitations.  All other systems reviewed and are negative.   Updated Vital Signs There were no vitals taken for this visit.  Physical Exam Vitals and nursing note reviewed.   68 year old female, resting comfortably and in no acute distress. Vital signs are ***. Oxygen saturation is ***%, which is normal. Head is normocephalic and atraumatic. PERRLA, EOMI.  Neck is nontender and supple without adenopathy or JVD. Back is nontender and there is no CVA tenderness.  There is no presacral edema. Lungs are clear without rales, wheezes, or  rhonchi. Chest is nontender. Heart has regular rate and rhythm without murmur. Abdomen is soft, flat, nontender. Extremities have no cyanosis or edema, full range of motion is present. Skin is warm and dry without rash. Neurologic: Mental status is normal, cranial nerves are intact, moves all extremities equally.  (all labs ordered are listed, but only abnormal results are displayed) Labs Reviewed - No data to display  EKG: None  Radiology: No results found.  {Document cardiac monitor, telemetry assessment procedure when appropriate:32947} Procedures   Medications Ordered in the ED - No data to display    {Click here for ABCD2, HEART and other calculators REFRESH Note before signing:1}                              Medical Decision Making  Acute dyspnea.  This is a presentation with a wide range of treatment options and areas with it a high risk of morbidity and complications.  Differential diagnosis includes, but is not limited to, heart failure, pneumonia, ACS, pulmonary embolism.  I have reviewed her past records, and note nuclear stress test on 10/19/2019 which was a low risk study, echocardiogram on 10/07/2019 showing left ventricular ejection fraction 65-70% with indeterminant diastolic parameters.  I have ordered workup including chest x-ray, electrocardiogram, laboratory workup.  {Document critical care time when appropriate  Document review of labs and clinical decision tools ie CHADS2VASC2, etc  Document your independent review of radiology images and any outside records  Document your discussion with family members, caretakers and with consultants  Document social determinants of health affecting pt's care  Document your decision making why or why not admission, treatments were needed:32947:::1}   Final diagnoses:  None    ED Discharge Orders     None

## 2023-10-31 NOTE — ED Triage Notes (Signed)
 Patient BIB [facility] from home for [facility], then c/o problems with blood sugar, palpitations, SHOB, and nausea on EMS arrival.   HR 86 NSR BP 158/76 CBG 230 RR 28 98% RA  NS 4mg  zofran  22 L forearm

## 2023-11-01 ENCOUNTER — Encounter (HOSPITAL_COMMUNITY): Payer: Self-pay | Admitting: Internal Medicine

## 2023-11-01 ENCOUNTER — Emergency Department (HOSPITAL_COMMUNITY)

## 2023-11-01 DIAGNOSIS — E8729 Other acidosis: Secondary | ICD-10-CM | POA: Diagnosis present

## 2023-11-01 DIAGNOSIS — Z8709 Personal history of other diseases of the respiratory system: Secondary | ICD-10-CM

## 2023-11-01 DIAGNOSIS — E782 Mixed hyperlipidemia: Secondary | ICD-10-CM

## 2023-11-01 DIAGNOSIS — I10 Essential (primary) hypertension: Secondary | ICD-10-CM

## 2023-11-01 DIAGNOSIS — E119 Type 2 diabetes mellitus without complications: Secondary | ICD-10-CM | POA: Diagnosis not present

## 2023-11-01 DIAGNOSIS — E876 Hypokalemia: Secondary | ICD-10-CM | POA: Diagnosis not present

## 2023-11-01 DIAGNOSIS — R918 Other nonspecific abnormal finding of lung field: Secondary | ICD-10-CM | POA: Diagnosis not present

## 2023-11-01 LAB — CBC WITH DIFFERENTIAL/PLATELET
Abs Immature Granulocytes: 0.05 K/uL (ref 0.00–0.07)
Abs Immature Granulocytes: 0.03 K/uL (ref 0.00–0.07)
Basophils Absolute: 0 K/uL (ref 0.0–0.1)
Basophils Absolute: 0 K/uL (ref 0.0–0.1)
Basophils Relative: 0 %
Basophils Relative: 1 %
Eosinophils Absolute: 0.2 K/uL (ref 0.0–0.5)
Eosinophils Absolute: 0.1 K/uL (ref 0.0–0.5)
Eosinophils Relative: 2 %
Eosinophils Relative: 2 %
HCT: 36.6 % (ref 36.0–46.0)
HCT: 34.8 % — ABNORMAL LOW (ref 36.0–46.0)
Hemoglobin: 12 g/dL (ref 12.0–15.0)
Hemoglobin: 11.7 g/dL — ABNORMAL LOW (ref 12.0–15.0)
Immature Granulocytes: 1 %
Immature Granulocytes: 0 %
Lymphocytes Relative: 25 %
Lymphocytes Relative: 28 %
Lymphs Abs: 2.4 K/uL (ref 0.7–4.0)
Lymphs Abs: 2.2 K/uL (ref 0.7–4.0)
MCH: 30.1 pg (ref 26.0–34.0)
MCH: 30 pg (ref 26.0–34.0)
MCHC: 32.8 g/dL (ref 30.0–36.0)
MCHC: 33.6 g/dL (ref 30.0–36.0)
MCV: 91.7 fL (ref 80.0–100.0)
MCV: 89.2 fL (ref 80.0–100.0)
Monocytes Absolute: 0.5 K/uL (ref 0.1–1.0)
Monocytes Absolute: 0.5 K/uL (ref 0.1–1.0)
Monocytes Relative: 5 %
Monocytes Relative: 7 %
Neutro Abs: 6.3 K/uL (ref 1.7–7.7)
Neutro Abs: 4.8 K/uL (ref 1.7–7.7)
Neutrophils Relative %: 67 %
Neutrophils Relative %: 62 %
Platelets: 234 K/uL (ref 150–400)
Platelets: 220 K/uL (ref 150–400)
RBC: 3.99 MIL/uL (ref 3.87–5.11)
RBC: 3.9 MIL/uL (ref 3.87–5.11)
RDW: 15.2 % (ref 11.5–15.5)
RDW: 15.3 % (ref 11.5–15.5)
WBC: 9.4 K/uL (ref 4.0–10.5)
WBC: 7.7 K/uL (ref 4.0–10.5)
nRBC: 0 % (ref 0.0–0.2)
nRBC: 0 % (ref 0.0–0.2)

## 2023-11-01 LAB — SALICYLATE LEVEL: Salicylate Lvl: <7 mg/dL — ABNORMAL LOW (ref 7.0–30.0)

## 2023-11-01 LAB — URINALYSIS, W/ REFLEX TO CULTURE (INFECTION SUSPECTED)
Bacteria, UA: NONE SEEN
Bilirubin Urine: NEGATIVE
Glucose, UA: >=500 mg/dL — AB
Hgb urine dipstick: NEGATIVE
Ketones, ur: NEGATIVE mg/dL
Leukocytes,Ua: NEGATIVE
Nitrite: NEGATIVE
Protein, ur: NEGATIVE mg/dL
Specific Gravity, Urine: 1.024 (ref 1.005–1.030)
pH: 6 (ref 5.0–8.0)

## 2023-11-01 LAB — RAPID URINE DRUG SCREEN, HOSP PERFORMED
Amphetamines: NOT DETECTED
Barbiturates: NOT DETECTED
Benzodiazepines: NOT DETECTED
Cocaine: NOT DETECTED
Opiates: NOT DETECTED
Tetrahydrocannabinol: POSITIVE — AB

## 2023-11-01 LAB — CHLORIDE, URINE, RANDOM: Chloride Urine: 135 mmol/L

## 2023-11-01 LAB — MAGNESIUM
Magnesium: 1.8 mg/dL (ref 1.7–2.4)
Magnesium: 2.3 mg/dL (ref 1.7–2.4)

## 2023-11-01 LAB — BLOOD GAS, VENOUS
Acid-base deficit: 0.7 mmol/L (ref 0.0–2.0)
Bicarbonate: 23.4 mmol/L (ref 20.0–28.0)
O2 Saturation: 79.4 %
Patient temperature: 37
pCO2, Ven: 36 mmHg — ABNORMAL LOW (ref 44–60)
pH, Ven: 7.42 (ref 7.25–7.43)
pO2, Ven: 48 mmHg — ABNORMAL HIGH (ref 32–45)

## 2023-11-01 LAB — COMPREHENSIVE METABOLIC PANEL WITH GFR
ALT: 13 U/L (ref 0–44)
AST: 18 U/L (ref 15–41)
Albumin: 3.1 g/dL — ABNORMAL LOW (ref 3.5–5.0)
Alkaline Phosphatase: 72 U/L (ref 38–126)
Anion gap: 11 (ref 5–15)
BUN: 12 mg/dL (ref 8–23)
CO2: 21 mmol/L — ABNORMAL LOW (ref 22–32)
Calcium: 8.8 mg/dL — ABNORMAL LOW (ref 8.9–10.3)
Chloride: 105 mmol/L (ref 98–111)
Creatinine, Ser: 0.88 mg/dL (ref 0.44–1.00)
GFR, Estimated: >60 mL/min (ref >60)
Glucose, Bld: 145 mg/dL — ABNORMAL HIGH (ref 70–99)
Potassium: 4.3 mmol/L (ref 3.5–5.1)
Sodium: 137 mmol/L (ref 135–145)
Total Bilirubin: 0.4 mg/dL (ref 0.0–1.2)
Total Protein: 6.3 g/dL — ABNORMAL LOW (ref 6.5–8.1)

## 2023-11-01 LAB — D-DIMER, QUANTITATIVE: D-Dimer, Quant: 0.57 ug{FEU}/mL — ABNORMAL HIGH (ref 0.00–0.50)

## 2023-11-01 LAB — GLUCOSE, CAPILLARY
Glucose-Capillary: 159 mg/dL — ABNORMAL HIGH (ref 70–99)
Glucose-Capillary: 130 mg/dL — ABNORMAL HIGH (ref 70–99)

## 2023-11-01 LAB — NA AND K (SODIUM & POTASSIUM), RAND UR
Potassium Urine: 15 mmol/L
Sodium, Ur: 129 mmol/L

## 2023-11-01 LAB — HEMOGLOBIN A1C
Hgb A1c MFr Bld: 7.6 % — ABNORMAL HIGH (ref 4.8–5.6)
Mean Plasma Glucose: 171.42 mg/dL

## 2023-11-01 LAB — BRAIN NATRIURETIC PEPTIDE: B Natriuretic Peptide: 102.7 pg/mL — ABNORMAL HIGH (ref 0.0–100.0)

## 2023-11-01 LAB — CBG MONITORING, ED: Glucose-Capillary: 127 mg/dL — ABNORMAL HIGH (ref 70–99)

## 2023-11-01 LAB — PHOSPHORUS: Phosphorus: 3.2 mg/dL (ref 2.5–4.6)

## 2023-11-01 LAB — PROTIME-INR
INR: 1 (ref 0.8–1.2)
Prothrombin Time: 13.5 s (ref 11.4–15.2)

## 2023-11-01 LAB — CK: Total CK: 74 U/L (ref 38–234)

## 2023-11-01 LAB — BETA-HYDROXYBUTYRIC ACID: Beta-Hydroxybutyric Acid: 0.1 mmol/L (ref 0.05–0.27)

## 2023-11-01 MED ORDER — INSULIN ASPART 100 UNIT/ML IJ SOLN
0.0000 [IU] | Freq: Three times a day (TID) | INTRAMUSCULAR | Status: DC
  Administered 2023-11-01: 2 [IU] via SUBCUTANEOUS

## 2023-11-01 MED ORDER — ASPIRIN 81 MG PO CHEW
81.0000 mg | CHEWABLE_TABLET | Freq: Every day | ORAL | Status: DC
  Administered 2023-11-01: 81 mg via ORAL
  Filled 2023-11-01: qty 1

## 2023-11-01 MED ORDER — ZOLPIDEM TARTRATE 5 MG PO TABS: 10.0000 mg | ORAL_TABLET | Freq: Every evening | ORAL | Status: DC | PRN

## 2023-11-01 MED ORDER — INSULIN ASPART 100 UNIT/ML IJ SOLN: 0.0000 [IU] | Freq: Every day | INTRAMUSCULAR | Status: DC

## 2023-11-01 MED ORDER — ALBUTEROL SULFATE HFA 108 (90 BASE) MCG/ACT IN AERS: 2.0000 | INHALATION_SPRAY | Freq: Four times a day (QID) | RESPIRATORY_TRACT | 2 refills | Status: AC | PRN

## 2023-11-01 MED ORDER — ACETAMINOPHEN 325 MG PO TABS: 650.0000 mg | ORAL_TABLET | Freq: Four times a day (QID) | ORAL | Status: DC | PRN

## 2023-11-01 MED ORDER — POTASSIUM CHLORIDE CRYS ER 20 MEQ PO TBCR: 20.0000 meq | EXTENDED_RELEASE_TABLET | Freq: Every day | ORAL | 0 refills | Status: AC

## 2023-11-01 MED ORDER — ONDANSETRON HCL 4 MG/2ML IJ SOLN: 4.0000 mg | Freq: Four times a day (QID) | INTRAMUSCULAR | Status: DC | PRN

## 2023-11-01 MED ORDER — MAGNESIUM SULFATE 2 GM/50ML IV SOLN
2.0000 g | Freq: Once | INTRAVENOUS | Status: AC
  Administered 2023-11-01: 2 g via INTRAVENOUS
  Filled 2023-11-01: qty 50

## 2023-11-01 MED ORDER — MELATONIN 3 MG PO TABS: 3.0000 mg | ORAL_TABLET | Freq: Every evening | ORAL | Status: DC | PRN

## 2023-11-01 MED ORDER — IRBESARTAN 300 MG PO TABS
300.0000 mg | ORAL_TABLET | Freq: Every day | ORAL | Status: DC
  Administered 2023-11-01: 300 mg via ORAL
  Filled 2023-11-01: qty 1

## 2023-11-01 MED ORDER — ALBUTEROL SULFATE (2.5 MG/3ML) 0.083% IN NEBU: 2.5000 mg | INHALATION_SOLUTION | RESPIRATORY_TRACT | Status: DC | PRN

## 2023-11-01 MED ORDER — ACETAMINOPHEN 650 MG RE SUPP
650.0000 mg | Freq: Four times a day (QID) | RECTAL | Status: DC | PRN
Start: 2023-11-01 — End: 2023-11-01

## 2023-11-01 MED ORDER — POTASSIUM CHLORIDE CRYS ER 20 MEQ PO TBCR
40.0000 meq | EXTENDED_RELEASE_TABLET | Freq: Once | ORAL | Status: AC
  Administered 2023-11-01: 40 meq via ORAL
  Filled 2023-11-01 (×2): qty 2

## 2023-11-01 MED ORDER — IOHEXOL 350 MG/ML SOLN
75.0000 mL | Freq: Once | INTRAVENOUS | Status: AC | PRN
  Administered 2023-11-01: 75 mL via INTRAVENOUS

## 2023-11-01 MED ORDER — POTASSIUM CHLORIDE 10 MEQ/100ML IV SOLN
10.0000 meq | INTRAVENOUS | Status: AC
  Administered 2023-11-01 (×2): 10 meq via INTRAVENOUS
  Filled 2023-11-01 (×2): qty 100

## 2023-11-01 MED ORDER — ATORVASTATIN CALCIUM 40 MG PO TABS
40.0000 mg | ORAL_TABLET | Freq: Every day | ORAL | Status: DC
  Administered 2023-11-01: 40 mg via ORAL
  Filled 2023-11-01: qty 1

## 2023-11-01 MED ORDER — LACTATED RINGERS IV BOLUS
1000.0000 mL | Freq: Once | INTRAVENOUS | Status: AC
  Administered 2023-11-01: 1000 mL via INTRAVENOUS

## 2023-11-01 NOTE — Discharge Summary (Signed)
 Physician Discharge Summary  Chelsea Gray FMW:[id number] DOB: 1955-07-12 DOA: 10/31/2023  PCP: Vernon Velna SAUNDERS, MD  Admit date: 10/31/2023 Discharge date: 11/01/2023  Admitted From: Home Disposition: Home  Recommendations for Outpatient Follow-up:  Follow up with PCP in 1-2 weeks   Home Health: Not applicable Equipment/Devices: Not applicable  Discharge Condition: Stable CODE STATUS: Full code Diet recommendation: Low-salt diet, low-carb diet  Discharge summary: 68 year old with history of COPD, previous smoker, type 2 diabetes, hypertension hyperlipidemia presented with sudden onset of shortness of breath preceded by nasal congestion.  She was feeling weak.  No fever or chills.  Denied any wheezing or chest tightness.  In the emergency room, she was hemodynamically stable.  On room air.  Potassium 2.9, magnesium  was normal.  Her CO2 was 13.  CT angiogram of the chest with multiple small pulmonary nodules, emphysematous lungs.  Negative for PE or consolidation.  She undergoes once a year lung cancer screening with high-resolution CT scan and last CT scan was on 7/1 with similar findings.  Admitted for observation, given IV fluids and potassium was replaced.  Patient is currently asymptomatic.  Transient shortness of breath: Suspect URI symptoms.  Currently asymptomatic.  No evidence of pneumonia or infection.  No evidence of thromboembolism. Symptomatic management.  Will prescribe albuterol  as needed to use for episodes of shortness of breath. She can also use steroid/Flonase for nasal congestion.  Hypokalemia: Chronic hypokalemia.  Also on irbesartan .  Replaced and adequate.  Will send on a scheduled replacement.  Magnesium  is adequate.   Stable to discharge home with outpatient follow-up.   Discharge Diagnoses:  Principal Problem:   Hypokalemia Active Problems:   DM2 (diabetes mellitus, type 2) (HCC)   Essential hypertension   Hyperlipidemia   High anion gap metabolic  acidosis   Pulmonary nodules   History of COPD    Discharge Instructions  Discharge Instructions     Diet - low sodium heart healthy   Complete by: As directed    Increase activity slowly   Complete by: As directed       Allergies as of 11/01/2023       Reactions   Codeine Sulfate Nausea And Vomiting, Other (See Comments)   dizziness        Medication List     TAKE these medications    acetaminophen  500 MG tablet Commonly known as: TYLENOL  Take 1,000 mg by mouth every 6 (six) hours as needed for mild pain (pain score 1-3) or headache.   albuterol  108 (90 Base) MCG/ACT inhaler Commonly known as: VENTOLIN  HFA Inhale 2 puffs into the lungs every 6 (six) hours as needed for wheezing or shortness of breath.   amoxicillin  500 MG capsule Commonly known as: AMOXIL  Take 4 capsules (2000 mg total) by mouth one hour prior to dental procedure.   aspirin  81 MG chewable tablet Chew 1 tablet (81 mg total) by mouth daily.   atorvastatin  40 MG tablet Commonly known as: LIPITOR  Take 40 mg by mouth daily.   Cholecalciferol 250 MCG (10000 UT) Caps Take 10,000 Units by mouth in the morning.   ferrous sulfate  325 (65 FE) MG tablet Take 1 tablet (325 mg total) by mouth daily.   gabapentin  100 MG capsule Commonly known as: NEURONTIN  Take 100 mg by mouth daily as needed (Knee pain).   Jardiance 10 MG Tabs tablet Generic drug: empagliflozin TAKE 1 TABLET BY MOUTH EVERY DAY FOR 30 DAYS   Lantus  SoloStar 100 UNIT/ML Solostar Pen Generic drug: insulin   glargine Inject 8 Units into the skin daily.   metFORMIN  500 MG tablet Commonly known as: GLUCOPHAGE  Take 1 tablet (500 mg total) by mouth 2 (two) times daily with a meal. What changed: when to take this   methimazole  10 MG tablet Commonly known as: TAPAZOLE  Take 2 tablets (20 mg total) by mouth daily.   metoprolol  tartrate 50 MG tablet Commonly known as: LOPRESSOR  Take 50 mg by mouth 2 (two) times daily.    nitroGLYCERIN  0.4 MG SL tablet Commonly known as: Nitrostat  Place 1 tablet (0.4 mg total) under the tongue every 5 (five) minutes as needed for chest pain.   olmesartan  40 MG tablet Commonly known as: BENICAR  Take 40 mg by mouth daily.   ondansetron  4 MG disintegrating tablet Commonly known as: ZOFRAN -ODT Take 4 mg by mouth daily as needed.   Ozempic (0.25 or 0.5 MG/DOSE) 2 MG/3ML Sopn Generic drug: Semaglutide(0.25 or 0.5MG /DOS) Inject 2 mg into the skin once a week. Inject on Monday   pantoprazole  40 MG tablet Commonly known as: PROTONIX  Take 1 tablet (40 mg total) by mouth daily.   potassium chloride  SA 20 MEQ tablet Commonly known as: KLOR-CON  M Take 1 tablet (20 mEq total) by mouth daily for 3 days.   timolol 0.5 % ophthalmic solution Commonly known as: TIMOPTIC Place 1 drop into both eyes daily.   zolpidem  10 MG tablet Commonly known as: AMBIEN  Take 10 mg by mouth at bedtime as needed.        Allergies  Allergen Reactions   Codeine Sulfate Nausea And Vomiting and Other (See Comments)    dizziness    Consultations: None   Procedures/Studies: CT Angio Chest PE W and/or Wo Contrast Result Date: 11/01/2023 CLINICAL DATA:  Positive D-dimer, shortness of breath and nausea. Pulmonary embolism suspected with low to intermediate probability. EXAM: CT ANGIOGRAPHY CHEST WITH CONTRAST TECHNIQUE: Multidetector CT imaging of the chest was performed using the standard protocol during bolus administration of intravenous contrast. Multiplanar CT image reconstructions and MIPs were obtained to evaluate the vascular anatomy. RADIATION DOSE REDUCTION: This exam was performed according to the departmental dose-optimization program which includes automated exposure control, adjustment of the mA and/or kV according to patient size and/or use of iterative reconstruction technique. CONTRAST:  75mL OMNIPAQUE  IOHEXOL  350 MG/ML SOLN COMPARISON:  Chest CTs without contrast 10/05/2023,  04/05/2023 and 02/05/2022. Last chest x-ray was AP Lat chest yesterday. FINDINGS: Cardiovascular: Scattered 2 vessel calcific plaques lad and circumflex coronary arteries. No pericardial effusion. The cardiac size is normal. Arterial opacification is diagnostic. No pulmonary arterial dilatation or embolus is seen. Pulmonary veins are nondistended. There is aortic atherosclerotic mixed plaque greatest in the arch, small amount in the brachiocephalic trunk. There is no aneurysm, stenosis or dissection. Mediastinum/Nodes: No enlarged mediastinal, hilar, or axillary lymph nodes. The lower poles of the thyroid  gland, trachea, and esophagus demonstrate no significant findings. There is a small hiatal hernia. Lungs/Pleura: The lungs are mildly emphysematous, with paraseptal changes in the upper apices and mild centrilobular disease in the upper lobes. There are numerous tiny nodules in the upper lobes, occasional nodules in the lower and right middle lobes, most of which are in the periphery although some were randomly distributed. At least some of this is probably due to smoking related respiratory bronchiolitis. The more randomly distributed nodules, for example a 4 mm ground-glass right apical nodule on 7:25, 3 mm left upper lobe nodule on 7:37, 4 mm left upper lobe nodule on 7:53, 3 mm left  upper lobe perihilar nodule on 7:49, and a 4 mm right middle lobe ground-glass nodule on 7:58, are all unchanged. There are no new or interval enlarged nodules. There is diffuse bronchial thickening but no focal pneumonia, no pleural effusion, thickening or pneumothorax. Upper Abdomen: The liver moderately steatotic. Stable nodular thickening in the adrenal glands. No acute upper abdominal findings. Musculoskeletal: Degenerative change and mild kyphosis thoracic spine. No acute or significant osseous findings. Unremarkable visualized chest wall. Review of the MIP images confirms the above findings. IMPRESSION: 1. No acute chest CT  or CTA findings. No evidence of pulmonary embolus. 2. Emphysema and diffuse bronchitis. 3. Numerous tiny nodules in the upper lobes, occasional nodules in the lower and right middle lobes, most of which are in the periphery although some were randomly distributed. At least some of this is probably due to smoking related respiratory bronchiolitis. 4. The more randomly distributed referenced nodules as well is others are all unchanged. No new or interval enlarged nodules. Continue low-dose screening annual CT with next study recommended in 12 months. 5. Aortic and coronary artery atherosclerosis. 6. Small hiatal hernia. 7. Hepatic steatosis. 8. Stable nodular thickening in the adrenal glands. Aortic Atherosclerosis (ICD10-I70.0) and Emphysema (ICD10-J43.9). Electronically Signed   By: Francis Quam M.D.   On: 11/01/2023 03:15   DG Chest 2 View Result Date: 10/31/2023 CLINICAL DATA:  Shortness of breath EXAM: CHEST - 2 VIEW COMPARISON:  10/07/2019 FINDINGS: The heart size and mediastinal contours are within normal limits. Both lungs are clear. The visualized skeletal structures are unremarkable. IMPRESSION: No active cardiopulmonary disease. Electronically Signed   By: Oneil Devonshire M.D.   On: 10/31/2023 23:48   CT CHEST LCS NODULE F/U LOW DOSE WO CONTRAST Result Date: 10/17/2023 CLINICAL DATA:  68 year old female with 30 pack-year history of smoking. Lung cancer screening. EXAM: CT CHEST WITHOUT CONTRAST FOR LUNG CANCER SCREENING NODULE FOLLOW-UP TECHNIQUE: Multidetector CT imaging of the chest was performed following the standard protocol without IV contrast. RADIATION DOSE REDUCTION: This exam was performed according to the departmental dose-optimization program which includes automated exposure control, adjustment of the mA and/or kV according to patient size and/or use of iterative reconstruction technique. COMPARISON:  04/05/2023 FINDINGS: Cardiovascular: The heart size is normal. No substantial pericardial  effusion. Coronary artery calcification is evident. Mild atherosclerotic calcification is noted in the wall of the thoracic aorta. Mediastinum/Nodes: No mediastinal lymphadenopathy. No evidence for gross hilar lymphadenopathy although assessment is limited by the lack of intravenous contrast on the current study. The esophagus has normal imaging features. There is no axillary lymphadenopathy. Lungs/Pleura: Centrilobular and paraseptal emphysema evident. The numerous ill-defined upper lung predominant tiny pulmonary nodule seen previously have decreased in the interval. Multiple scattered reference tiny bilateral pulmonary nodules identified previously are stable in the interval. No suspicious new pulmonary nodule or mass. No focal airspace consolidation. No pleural effusion. Upper Abdomen: Nodular thickening of both adrenal glands is similar to prior. The visualized portion of the upper abdomen is otherwise unremarkable. Musculoskeletal: No worrisome lytic or sclerotic osseous abnormality. IMPRESSION: Lung-RADS 2, benign appearance or behavior. Continue annual screening with low-dose chest CT without contrast in 12 months. Aortic Atherosclerosis (ICD10-I70.0) and Emphysema (ICD10-J43.9). Electronically Signed   By: Camellia Candle M.D.   On: 10/17/2023 07:07   (Echo, Carotid, EGD, Colonoscopy, ERCP)    Subjective: Patient seen and examined.  No overnight events.  Currently without any symptoms.  She was thinking that she has to stay in the hospital for 4 to  5 days.  She is asymptomatic today and mobilizing around.  On room air.  Electrolytes are adequate.  We discussed about going home and outpatient follow-up.  Updated her CT scan findings.  Comfortable with plan to go home.   Discharge Exam: Vitals:   11/01/23 0501 11/01/23 0751  BP:  (!) 187/67  Pulse:  62  Resp:  16  Temp: 98.2 F (36.8 C) 98.3 F (36.8 C)  SpO2:  100%   Vitals:   10/31/23 2318 11/01/23 0421 11/01/23 0501 11/01/23 0751  BP:   136/82  (!) 187/67  Pulse:  83  62  Resp:  16  16  Temp:   98.2 F (36.8 C) 98.3 F (36.8 C)  TempSrc:   Oral Oral  SpO2:  100%  100%  Weight: 78 kg     Height: 5' 5 (1.651 m)       General: Pt is alert, awake, not in acute distress Cardiovascular: RRR, S1/S2 +, no rubs, no gallops Respiratory: CTA bilaterally, no wheezing, no rhonchi Abdominal: Soft, NT, ND, bowel sounds + Extremities: no edema, no cyanosis    The results of significant diagnostics from this hospitalization (including imaging, microbiology, ancillary and laboratory) are listed below for reference.     Microbiology: No results found for this or any previous visit (from the past 240 hours).   Labs: BNP (last 3 results) Recent Labs    10/31/23 2307  BNP 102.7*   Basic Metabolic Panel: Recent Labs  Lab 10/31/23 2307 11/01/23 0530  NA 138 137  K 2.9* 4.3  CL 108 105  CO2 13* 21*  GLUCOSE 175* 145*  BUN 14 12  CREATININE 0.89 0.88  CALCIUM  8.3* 8.8*  MG 1.8 2.3  PHOS  --  3.2   Liver Function Tests: Recent Labs  Lab 11/01/23 0530  AST 18  ALT 13  ALKPHOS 72  BILITOT 0.4  PROT 6.3*  ALBUMIN 3.1*   No results for input(s): LIPASE, AMYLASE in the last 168 hours. No results for input(s): AMMONIA in the last 168 hours. CBC: Recent Labs  Lab 10/31/23 2307 11/01/23 0530  WBC 9.4 7.7  NEUTROABS 6.3 4.8  HGB 12.0 11.7*  HCT 36.6 34.8*  MCV 91.7 89.2  PLT 234 220   Cardiac Enzymes: Recent Labs  Lab 11/01/23 0530  CKTOTAL 74   BNP: Invalid input(s): POCBNP CBG: Recent Labs  Lab 11/01/23 0008 11/01/23 0801  GLUCAP 127* 159*   D-Dimer Recent Labs    10/31/23 2307  DDIMER 0.57*   Hgb A1c Recent Labs    11/01/23 0530  HGBA1C 7.6*   Lipid Profile No results for input(s): CHOL, HDL, LDLCALC, TRIG, CHOLHDL, LDLDIRECT in the last 72 hours. Thyroid  function studies No results for input(s): TSH, T4TOTAL, T3FREE, THYROIDAB in the last 72  hours.  Invalid input(s): FREET3 Anemia work up No results for input(s): VITAMINB12, FOLATE, FERRITIN, TIBC, IRON, RETICCTPCT in the last 72 hours. Urinalysis    Component Value Date/Time   COLORURINE STRAW (A) 11/01/2023 0525   APPEARANCEUR CLEAR 11/01/2023 0525   LABSPEC 1.024 11/01/2023 0525   PHURINE 6.0 11/01/2023 0525   GLUCOSEU >=500 (A) 11/01/2023 0525   GLUCOSEU NEGATIVE 12/24/2010 0931   HGBUR NEGATIVE 11/01/2023 0525   BILIRUBINUR NEGATIVE 11/01/2023 0525   KETONESUR NEGATIVE 11/01/2023 0525   PROTEINUR NEGATIVE 11/01/2023 0525   UROBILINOGEN 0.2 07/02/2011 1355   NITRITE NEGATIVE 11/01/2023 0525   LEUKOCYTESUR NEGATIVE 11/01/2023 0525   Sepsis Labs Recent Labs  Lab  10/31/23 2307 11/01/23 0530  WBC 9.4 7.7   Microbiology No results found for this or any previous visit (from the past 240 hours).   Time coordinating discharge: 32 minutes  SIGNED:   Renato Applebaum, MD  [facility] 11/01/2023, 8:34 AM

## 2023-11-01 NOTE — Progress Notes (Signed)
 Per RN patient ok to discharge VSS.    AVS and education completed.  Spouse at bedside.  Patient and family given time to ask questions.  Verbal understanding of discharge plan of care.  PIV removed dressing intact. Transferred or Main entrance.

## 2023-11-01 NOTE — H&P (Signed)
 History and Physical      Chelsea Gray FMW:[id number] DOB: 1955-12-02 DOA: 10/31/2023; DOS: 11/01/2023  PCP: Vernon Velna SAUNDERS, MD  Patient coming from: home   I have personally briefly reviewed patient's old medical records in [facility]  Chief Complaint: Shortness of breath  HPI: Chelsea Gray is a 68 y.o. female with medical history significant for COPD, distant history of pulmonary embolism, type 2 diabetes mellitus, essential hypertension, hyperlipidemia, who is admitted to [facility] on 10/31/2023 with hypokalemia after presenting from home to [facility] ED complaining of shortness of breath.   The patient reports to 3 days of mild shortness of breath in the absence of any orthopnea, PND, or worsening of peripheral edema.  Denies any associated chest pain, diaphoresis, presyncope, or syncope.  No recent diarrhea.  No hemoptysis.  She also denies any recent subjective fever, chills, rigors, or generalized myalgias.  No recent abdominal pain, dysuria, gross hematuria.  No wheezing.  She has a history of COPD in the context of chronic tobacco use.  Medical history also notable for type 2 diabetes mellitus.  On my discussions with her, denies any current shortness of breath.  Had conveyed concern regarding a distant history of pulmonary embolism.    ED Course:  Vital signs in the ED were notable for the following: Afebrile; heart rates in the 60s 87.  Blood pressures in the 1 teens 130s; respiratory rate 16, oxygen saturation 100% on room air.  Labs were notable for the following: CMP was notable for the following: Sodium 138, potassium 2.9, chloride 108, bicarb 13, anion gap 17, creatinine 0.89 compared to most recent prior value of 1.51 in May 2023, glucose 175.  Magnesium  level 1.8.  BNP 102.7 without any prior BNP data point available for point comparison D-dimer 0.57.  CBC notable for white cell count 9400, hemoglobin 12.  Urinalysis along with urine electrolytes ordered  by EDP are currently pending.  Per my interpretation, EKG in ED demonstrated the following: Sinus rhythm with single PVC, heart rate 68, normal intervals, nonspecific T wave inversion in lead III, and no evidence of ST changes, including no evidence of ST elevation.  Imaging in the ED, per corresponding formal radiology read, was notable for the following: CTA chest with PE protocol showed no evidence of acute pulmonary embolism nor any evidence of additional acute cardiopulmonary process, including no evidence of infiltrate edema, effusion, or pneumothorax.  Today CTA chest showed evidence of chronic emphysematous changes as well as unchanged appearance of pulmonary nodules, without evidence of any new pulmonary nodules.  Corresponding radiology recommendation for annual low-dose CT screening.  While in the ED, the following were administered: Potassium chloride  40 mill colons p.o. x 1 dose, potassium chloride  20 mill equivalents IV over 2 hours, lactated Ringer 's x 1 L bolus, magnesium  sulfate 2 g IV over 2 hours.  Subsequently, the patient was admitted for further evaluation and management of presenting hypokalemia in the setting of anion gap metabolic acidosis.     Review of Systems: As per HPI otherwise 10 point review of systems negative.   Past Medical History:  Diagnosis Date   Blood transfusion    d/t hemmorrhage post c- section   DEPRESSION 01/24/2009   DM 01/24/2009   H/O blood transfusion reaction 09/1992   cause PE and hemolytic type reaction per pt   History of one miscarriage    HYPERCHOLESTEROLEMIA 01/24/2009   HYPERTENSION 01/24/2009   Osteoarthritis    Pulmonary embolism (  HCC)    result of blood transfusion reaction 18 years ago after childbirth    Past Surgical History:  Procedure Laterality Date   BIOPSY  10/09/2019   Procedure: BIOPSY;  Surgeon: Saintclair Jasper, MD;  Location: [facility] ENDOSCOPY;  Service: Gastroenterology;;   BREAST CYST EXCISION     BREAST REDUCTION  SURGERY     CERVICAL CERCLAGE     CESAREAN SECTION     COLONOSCOPY WITH PROPOFOL  N/A 10/09/2019   Procedure: COLONOSCOPY WITH PROPOFOL ;  Surgeon: Saintclair Jasper, MD;  Location: [facility] ENDOSCOPY;  Service: Gastroenterology;  Laterality: N/A;   ESOPHAGOGASTRODUODENOSCOPY (EGD) WITH PROPOFOL  N/A 10/09/2019   Procedure: ESOPHAGOGASTRODUODENOSCOPY (EGD) WITH PROPOFOL ;  Surgeon: Saintclair Jasper, MD;  Location: [facility] ENDOSCOPY;  Service: Gastroenterology;  Laterality: N/A;   HEMOSTASIS CLIP PLACEMENT  10/09/2019   Procedure: HEMOSTASIS CLIP PLACEMENT;  Surgeon: Saintclair Jasper, MD;  Location: [facility] ENDOSCOPY;  Service: Gastroenterology;;   HOT HEMOSTASIS N/A 10/09/2019   Procedure: HOT HEMOSTASIS (ARGON PLASMA COAGULATION/BICAP);  Surgeon: Saintclair Jasper, MD;  Location: [facility] ENDOSCOPY;  Service: Gastroenterology;  Laterality: N/A;   JOINT REPLACEMENT     KNEE ARTHROPLASTY  07/14/2011   Procedure: COMPUTER ASSISTED TOTAL KNEE ARTHROPLASTY;  Surgeon: Cordella Glendia Hutchinson, MD;  Location: [facility] OR;  Service: Orthopedics;  Laterality: Left;  Left total knee arthroplasty   POLYPECTOMY  10/09/2019   Procedure: POLYPECTOMY;  Surgeon: Saintclair Jasper, MD;  Location: [facility] ENDOSCOPY;  Service: Gastroenterology;;   REDUCTION MAMMAPLASTY Bilateral     Social History:  reports that she has been smoking. She has a 0.6 pack-year smoking history. She has never used smokeless tobacco. She reports current alcohol use. She reports that she does not use drugs.   Allergies  Allergen Reactions   Codeine Sulfate Nausea And Vomiting and Other (See Comments)    dizziness    Family History  Problem Relation Age of Onset   Diabetes Mother    Hypertension Mother    Diabetes Father    Arrhythmia Father    Heart disease Father        CABG   Breast cancer Sister    Diabetes Sister    Anesthesia problems Neg Hx     Family history reviewed and not pertinent    Prior to Admission medications   Medication Sig Start Date End Date Taking? Authorizing  Provider  amoxicillin  (AMOXIL ) 500 MG capsule Take 4 capsules (2000 mg total) by mouth one hour prior to dental procedure. 05/18/23   Hutchinson Cordella Glendia, MD  aspirin  81 MG chewable tablet Chew 1 tablet (81 mg total) by mouth daily. 10/11/19   Regalado, Belkys A, MD  atorvastatin  (LIPITOR ) 40 MG tablet Take 40 mg by mouth daily. 07/13/19   [provider]  empagliflozin (JARDIANCE) 10 MG TABS tablet TAKE 1 TABLET BY MOUTH EVERY DAY FOR 30 DAYS    [provider]  ferrous sulfate  325 (65 FE) MG tablet Take 1 tablet (325 mg total) by mouth daily. 10/10/19 10/09/20  Regalado, Owen LABOR, MD  FREESTYLE LITE test strip CHECK BLOOD SUGAR ONCE A DAY Patient taking differently: 1 each by Other route daily.  08/27/15   Kassie Mallick, MD  meloxicam  (MOBIC ) 15 MG tablet TAKE 1 TABLET BY MOUTH EVERY DAY AS NEEDED FOR PAIN 06/30/22   Magnant, Carlin CROME, PA-C  metFORMIN  (GLUCOPHAGE ) 500 MG tablet Take 1 tablet (500 mg total) by mouth 2 (two) times daily with a meal. Patient taking differently: Take 500 mg by mouth daily with breakfast.  12/15/16  Kassie Mallick, MD  methimazole  (TAPAZOLE ) 10 MG tablet Take 2 tablets (20 mg total) by mouth daily. 10/11/19   Regalado, Belkys A, MD  metoprolol  tartrate (LOPRESSOR ) 25 MG tablet TAKE 1 TABLET 2 TIMES DAILY. PATIENT MUST MAKE APPOINTMENT FOR FUTURE REFILLS FIRST ATTEMPT 05/25/22   Pietro Redell RAMAN, MD  nitroGLYCERIN  (NITROSTAT ) 0.4 MG SL tablet Place 1 tablet (0.4 mg total) under the tongue every 5 (five) minutes as needed for chest pain. 10/10/19 10/09/20  Regalado, Belkys A, MD  NOVOTWIST 32G X 5 MM MISC USE DAILY FOR INJECTION Patient taking differently: 1 each by Other route daily.  05/21/15   Kassie Mallick, MD  olmesartan  (BENICAR ) 40 MG tablet Take 40 mg by mouth daily.     [provider]  ondansetron  (ZOFRAN ) 4 MG tablet Take 1 tablet (4 mg total) by mouth every 6 (six) hours. 08/31/21   Redwine, Madison A, PA-C  pantoprazole  (PROTONIX ) 40 MG tablet Take  1 tablet (40 mg total) by mouth daily. 10/11/19   Regalado, Owen A, MD  prednisoLONE acetate (PRED FORTE) 1 % ophthalmic suspension SMARTSIG:1 Drop(s) In Eye(s) 03/27/21   [provider]  TRULICITY  1.5 MG/0.5ML SOPN INJECT 1.5 MG INTO THE SKIN ONCE A WEEK. 09/21/16   Kassie Mallick, MD     Objective    Physical Exam: Vitals:   10/31/23 2317 10/31/23 2318 11/01/23 0421  BP: (!) 119/54  136/82  Pulse: 66  83  Resp: 16  16  Temp: 98.2 F (36.8 C)    TempSrc: Oral    SpO2: 100%  100%  Weight:  78 kg   Height:  5' 5 (1.651 m)     General: appears to be stated age; alert, oriented Skin: warm, dry, no rash Head:  AT/Zoar Mouth:  Oral mucosa membranes appear dry, normal dentition Neck: supple; trachea midline Heart:  RRR; did not appreciate any M/R/G Lungs: CTAB, did not appreciate any wheezes, rales, or rhonchi Abdomen: + BS; soft, ND, NT Vascular: 2+ pedal pulses b/l; 2+ radial pulses b/l Extremities: no peripheral edema, no muscle wasting     Labs on Admission: I have personally reviewed following labs and imaging studies  CBC: Recent Labs  Lab 10/31/23 2307  WBC 9.4  NEUTROABS 6.3  HGB 12.0  HCT 36.6  MCV 91.7  PLT 234   Basic Metabolic Panel: Recent Labs  Lab 10/31/23 2307  NA 138  K 2.9*  CL 108  CO2 13*  GLUCOSE 175*  BUN 14  CREATININE 0.89  CALCIUM  8.3*  MG 1.8   GFR: Estimated Creatinine Clearance: 61.6 mL/min (by C-G formula based on SCr of 0.89 mg/dL). Liver Function Tests: No results for input(s): AST, ALT, ALKPHOS, BILITOT, PROT, ALBUMIN in the last 168 hours. No results for input(s): LIPASE, AMYLASE in the last 168 hours. No results for input(s): AMMONIA in the last 168 hours. Coagulation Profile: No results for input(s): INR, PROTIME in the last 168 hours. Cardiac Enzymes: No results for input(s): CKTOTAL, CKMB, CKMBINDEX, TROPONINI in the last 168 hours. BNP (last 3 results) No results for  input(s): PROBNP in the last 8760 hours. HbA1C: No results for input(s): HGBA1C in the last 72 hours. CBG: Recent Labs  Lab 11/01/23 0008  GLUCAP 127*   Lipid Profile: No results for input(s): CHOL, HDL, LDLCALC, TRIG, CHOLHDL, LDLDIRECT in the last 72 hours. Thyroid  Function Tests: No results for input(s): TSH, T4TOTAL, FREET4, T3FREE, THYROIDAB in the last 72 hours. Anemia Panel: No results for input(s):  VITAMINB12, FOLATE, FERRITIN, TIBC, IRON, RETICCTPCT in the last 72 hours. Urine analysis:    Component Value Date/Time   COLORURINE AMBER (A) 08/30/2021 2141   APPEARANCEUR CLOUDY (A) 08/30/2021 2141   LABSPEC 1.024 08/30/2021 2141   PHURINE 5.0 08/30/2021 2141   GLUCOSEU >=500 (A) 08/30/2021 2141   GLUCOSEU NEGATIVE 12/24/2010 0931   HGBUR NEGATIVE 08/30/2021 2141   BILIRUBINUR NEGATIVE 08/30/2021 2141   KETONESUR 5 (A) 08/30/2021 2141   PROTEINUR 30 (A) 08/30/2021 2141   UROBILINOGEN 0.2 07/02/2011 1355   NITRITE NEGATIVE 08/30/2021 2141   LEUKOCYTESUR SMALL (A) 08/30/2021 2141    Radiological Exams on Admission: CT Angio Chest PE W and/or Wo Contrast Result Date: 11/01/2023 CLINICAL DATA:  Positive D-dimer, shortness of breath and nausea. Pulmonary embolism suspected with low to intermediate probability. EXAM: CT ANGIOGRAPHY CHEST WITH CONTRAST TECHNIQUE: Multidetector CT imaging of the chest was performed using the standard protocol during bolus administration of intravenous contrast. Multiplanar CT image reconstructions and MIPs were obtained to evaluate the vascular anatomy. RADIATION DOSE REDUCTION: This exam was performed according to the departmental dose-optimization program which includes automated exposure control, adjustment of the mA and/or kV according to patient size and/or use of iterative reconstruction technique. CONTRAST:  75mL OMNIPAQUE  IOHEXOL  350 MG/ML SOLN COMPARISON:  Chest CTs without contrast 10/05/2023, 04/05/2023  and 02/05/2022. Last chest x-ray was AP Lat chest yesterday. FINDINGS: Cardiovascular: Scattered 2 vessel calcific plaques lad and circumflex coronary arteries. No pericardial effusion. The cardiac size is normal. Arterial opacification is diagnostic. No pulmonary arterial dilatation or embolus is seen. Pulmonary veins are nondistended. There is aortic atherosclerotic mixed plaque greatest in the arch, small amount in the brachiocephalic trunk. There is no aneurysm, stenosis or dissection. Mediastinum/Nodes: No enlarged mediastinal, hilar, or axillary lymph nodes. The lower poles of the thyroid  gland, trachea, and esophagus demonstrate no significant findings. There is a small hiatal hernia. Lungs/Pleura: The lungs are mildly emphysematous, with paraseptal changes in the upper apices and mild centrilobular disease in the upper lobes. There are numerous tiny nodules in the upper lobes, occasional nodules in the lower and right middle lobes, most of which are in the periphery although some were randomly distributed. At least some of this is probably due to smoking related respiratory bronchiolitis. The more randomly distributed nodules, for example a 4 mm ground-glass right apical nodule on 7:25, 3 mm left upper lobe nodule on 7:37, 4 mm left upper lobe nodule on 7:53, 3 mm left upper lobe perihilar nodule on 7:49, and a 4 mm right middle lobe ground-glass nodule on 7:58, are all unchanged. There are no new or interval enlarged nodules. There is diffuse bronchial thickening but no focal pneumonia, no pleural effusion, thickening or pneumothorax. Upper Abdomen: The liver moderately steatotic. Stable nodular thickening in the adrenal glands. No acute upper abdominal findings. Musculoskeletal: Degenerative change and mild kyphosis thoracic spine. No acute or significant osseous findings. Unremarkable visualized chest wall. Review of the MIP images confirms the above findings. IMPRESSION: 1. No acute chest CT or CTA  findings. No evidence of pulmonary embolus. 2. Emphysema and diffuse bronchitis. 3. Numerous tiny nodules in the upper lobes, occasional nodules in the lower and right middle lobes, most of which are in the periphery although some were randomly distributed. At least some of this is probably due to smoking related respiratory bronchiolitis. 4. The more randomly distributed referenced nodules as well is others are all unchanged. No new or interval enlarged nodules. Continue low-dose screening annual CT  with next study recommended in 12 months. 5. Aortic and coronary artery atherosclerosis. 6. Small hiatal hernia. 7. Hepatic steatosis. 8. Stable nodular thickening in the adrenal glands. Aortic Atherosclerosis (ICD10-I70.0) and Emphysema (ICD10-J43.9). Electronically Signed   By: Francis Quam M.D.   On: 11/01/2023 03:15   DG Chest 2 View Result Date: 10/31/2023 CLINICAL DATA:  Shortness of breath EXAM: CHEST - 2 VIEW COMPARISON:  10/07/2019 FINDINGS: The heart size and mediastinal contours are within normal limits. Both lungs are clear. The visualized skeletal structures are unremarkable. IMPRESSION: No active cardiopulmonary disease. Electronically Signed   By: Oneil Devonshire M.D.   On: 10/31/2023 23:48      Assessment/Plan   Principal Problem:   Hypokalemia Active Problems:   DM2 (diabetes mellitus, type 2) (HCC)   Essential hypertension   Hyperlipidemia   High anion gap metabolic acidosis   Pulmonary nodules   History of COPD      #) Hypokalemia: presenting potassium level noted to be 2.9.  Concomitant magnesium  level is 1.8.  Differential includes potential contribution from outpatient use of albuterol  inhaler in setting ofRecent shortness of breath.  In the ED she has received 40 mill colons of oral potassium chloride  as well as 20 mEq of IV potassium chloride  this evening.  Additionally, she received 2 g of IV magnesium  sulfate tonight.  Plan: monitor on tele.  CMP, mag level in the AM.                    #) Anion gap metabolic acidosis, presenting BMP demonstrates bicarbonate 13, anion gap 17.  Etiology not entirely clear at this time.  She is a diabetic, but with very limited elevation in blood sugar, with corresponding glucose noted to be 175.  Will further evaluate for any evidence of euglycemic DKA via VBG, beta hydroxybutyric acid.  Her CTA chest did show some evidence of hepatic steatosis.  Will check INR to evaluate hepatic synthetic function.  No evidence of underlying infectious process at this time, including CTA chest which showed no evidence of infiltrate.  Will follow-up for result of urinalysis.  She is also on high intensity atorvastatin  as an outpatient.  Correspondingly, will check CPK level.  Further diagnostic evaluation, as outlined below.  There would be a potential contribution from outpatient use of albuterol  inhaler in setting of recent shortness of breath.  I suspect that her metabolic acidosis may also have led to her shortness of breath a compensatory respiratory alkalosis.  Will follow for result of blood gas.  Plan: Check beta-hydroxybutyrate acid, VBG.  Follow-up for results of urinalysis.  Check serum osmolality, CPK, salicylate level, INR, urinary drug screen.  Repeat CBC and CMP in the morning.  Hold home metformin  for now.                   # (Pulmonary nodules: Known history of pulmonary nodules, with today CTA chest showing the unchanged appearance of her pulmonary nodules, without any interval enlargement of the known pulmonary nodules, and without evidence of any new pulmonary nodules.  Radiology conveys recommendation for annual low-dose CT screening.  Plan: Per radiology, will convey their recommendation for annual low-dose CT screening.                     #) Type 2 Diabetes Mellitus: documented history of such. Home insulin  regimen: None. Home oral hypoglycemic agents: Metformin , empagllfozin .  presenting blood sugar: 175. Most recent A1c noted to be  8.8% when checked in July 2021.  She has noted to have a mild anion gap metabolic acidosis per presenting BMP, although it is felt that this is less likely as a consequence of euglycemic DKA.  Will check VBG as well as beta-hydroxybutyrate acid to further assess, while closely trending insulin  blood sugars as outlined below.  Plan: accuchecks QAC and HS with low dose SSI.  Add on hemoglobin A1c level. hold home oral hypoglycemic agents during this hospitalization.  Follow-up for results of VBG and beta hydroxybutyric acid.                        #) Essential Hypertension: documented h/o such, with outpatient antihypertensive regimen including olmesartan , metoprolol  tartrate.  SBP's in the ED today: 1 teens to 130s mmHg, with heart rates in the 60s to 80s.  Plan: Close monitoring of subsequent BP via routine VS. for now, will hold on beta-blocker, continue home A monitor strict I's and O's and daily weights.  Monitor on symmetry.RB.                       #) Hyperlipidemia: documented h/o such. On atorvastatin  as outpatient.   Plan: continue home statin. Follow for result of CPK level.                    #) COPD: Documented history thereof and consistent with the chronic emphysematous changes noted on today's CTA chest, without clinical evidence of acute exacerbation at this time.  Maintaining oxygen saturation at 100% on room air, without any evidence of acute respiratory distress. Denies any acute sob at this time.   Plan: prn albuterol  nebulizer. Check CMP and serum phos level. Follow for result of VBG.        DVT prophylaxis: SCD's   Code Status: Full code Family Communication: none Disposition Plan: Per Rounding Team Consults called: none;  Admission status: Observation     I SPENT GREATER THAN 75  MINUTES IN CLINICAL CARE TIME/MEDICAL DECISION-MAKING IN COMPLETING  THIS ADMISSION.      Jeronimo Hellberg B Nahshon Reich DO [facility]  From 7PM - 7AM   11/01/2023, 4:46 AM

## 2023-11-03 ENCOUNTER — Ambulatory Visit
Admission: RE | Admit: 2023-11-03 | Discharge: 2023-11-03 | Disposition: A | Discharge status: Home / Self Care | Source: Ambulatory Visit | Attending: Orthopedic Surgery | Admitting: Orthopedic Surgery

## 2023-11-03 ENCOUNTER — Encounter: Payer: Self-pay | Admitting: Orthopedic Surgery

## 2023-11-03 DIAGNOSIS — M25561 Pain in right knee: Secondary | ICD-10-CM

## 2023-11-04 NOTE — Telephone Encounter (Signed)
 Results still pending.

## 2023-11-05 ENCOUNTER — Ambulatory Visit: Payer: Self-pay | Admitting: Orthopedic Surgery

## 2023-11-05 NOTE — Progress Notes (Signed)
 I called and left message.  Essentially she has no cartilage in that lateral compartment and there is nothing really arthroscopically treatable in her knee.  If she needs something done more than injections it would be knee replacement.

## 2023-11-08 ENCOUNTER — Other Ambulatory Visit: Payer: Medicare Other

## 2023-11-25 NOTE — Telephone Encounter (Signed)
 Patient has severe arthritis in her knee.  She was last seen in the middle of June at which time a gel injection was performed.  The severity of arthritis in her knee would limit walking and standing endurance.

## 2023-11-26 ENCOUNTER — Telehealth: Payer: Self-pay | Admitting: Orthopedic Surgery

## 2023-11-26 NOTE — Telephone Encounter (Signed)
 Pt called asking for a letter for jury duty on Tuesday. Pt states she need the letter today if possible to excuse her from jury duty due to her right knee issues. Please call pt sometime today for her to pick letter up. Pt phone number is [phone].

## 2023-11-26 NOTE — Telephone Encounter (Signed)
 Done and sent pt mychart message

## 2023-12-31 ENCOUNTER — Encounter: Payer: Self-pay | Admitting: Orthopedic Surgery

## 2023-12-31 ENCOUNTER — Ambulatory Visit: Admitting: Orthopedic Surgery

## 2023-12-31 ENCOUNTER — Other Ambulatory Visit (INDEPENDENT_AMBULATORY_CARE_PROVIDER_SITE_OTHER): Payer: Self-pay

## 2023-12-31 VITALS — Ht 65.0 in | Wt 175.0 lb

## 2023-12-31 DIAGNOSIS — M25561 Pain in right knee: Secondary | ICD-10-CM | POA: Diagnosis not present

## 2023-12-31 DIAGNOSIS — M1711 Unilateral primary osteoarthritis, right knee: Secondary | ICD-10-CM

## 2023-12-31 NOTE — Progress Notes (Signed)
 Office Visit Note   Patient: Chelsea Gray           Date of Birth: [birth year]           MRN: [id number] Visit Date: 12/31/2023 Requested by: Vernon Velna SAUNDERS, MD [address] [place],  [place] [address] PCP: Vernon Velna SAUNDERS, MD  Subjective: Chief Complaint  Patient presents with   Right Knee - Pain    HPI: Chelsea Gray is a 68 y.o. female who presents to the office reporting right knee pain.  Patient states I am ready to get it fixed.  She has severe lateral compartment arthritis as well as medial and patellofemoral arthritis.  She has done well with left total knee replacement.  Her pain in the right knee is worsening and throbbing.  Describes constant pain which wakes her from sleep at night.  Also describes giving way.  Swelling stiffness with some mechanical symptoms as well.  She does smoke 1 to 2 cigarettes a day.  Has history of diabetes with recent A1c 7.6.  Uses a compression sleeve which she is moderately helpful along with Mobic  and ice.  She does report having a pulmonary embolism with the birth of her daughter 31 years ago.  Has 3 steps at home.  Husband and daughter do live in the same house.  She does not currently see a cardiologist..                ROS: All systems reviewed are negative as they relate to the chief complaint within the history of present illness.  Patient denies fevers or chills.  Assessment & Plan: Visit Diagnoses:  1. Right knee pain, unspecified chronicity   2. Primary osteoarthritis of right knee     Plan: Impression is end-stage right knee arthritis.  Failure of conservative management.  Nothing really arthroscopically treatable in the knee.  Plan at this time is right total knee replacement.  The risk benefits are discussed with the patient including not limited to infection nerve vessel damage incomplete pain relief as well as the rigorous nature of the rehabilitative process.  Patient understands the risk and benefits and  wishes to proceed.  All questions answered.  We will likely use Xarelto for DVT prophylaxis in the postop period followed by ultrasound within the first week to 10 days to rule out DVT followed by change over to aspirin .  Follow-Up Instructions: No follow-ups on file.   Orders:  Orders Placed This Encounter  Procedures   XR Knee 1-2 Views Right   No orders of the defined types were placed in this encounter.     Procedures: Large Joint Inj: L knee on 12/31/2023 8:48 AM Indications: diagnostic evaluation, joint swelling and pain Details: 18 G 1.5 in needle, superolateral approach  Arthrogram: No  Medications: 5 mL lidocaine  1 % Outcome: tolerated well, no immediate complications Procedure, treatment alternatives, risks and benefits explained, specific risks discussed. Consent was given by the patient. Immediately prior to procedure a time out was called to verify the correct patient, procedure, equipment, support staff and site/side marked as required. Patient was prepped and draped in the usual sterile fashion.       Clinical Data: No additional findings.  Objective: Vital Signs: Ht 5' 5 (1.651 m)   Wt 175 lb (79.4 kg)   BMI 29.12 kg/m   Physical Exam:  Constitutional: Patient appears well-developed HEENT:  Head: Normocephalic Eyes:EOM are normal Neck: Normal range of motion Cardiovascular: Normal  rate Pulmonary/chest: Effort normal Neurologic: Patient is alert Skin: Skin is warm Psychiatric: Patient has normal mood and affect  Ortho Exam: Ortho exam demonstrates range of motion of the right knee of 3 degrees to full flexion.  Collaterals are stable.  Effusion is present.  Extensor mechanism intact.  No masses lymphadenopathy or skin changes noted in that right knee region.  Patellofemoral crepitus is present.  Slight valgus alignment noted.  No groin pain with internal/external rotation of the leg.  Specialty Comments:  No specialty comments  available.  Imaging: XR Knee 1-2 Views Right Result Date: 12/31/2023 AP lateral radiographs right knee reviewed.  Mild valgus alignment.  End-stage arthritis present in the lateral compartment.  No acute fracture    PMFS History: Patient Active Problem List   Diagnosis Date Noted   Hypokalemia 11/01/2023   High anion gap metabolic acidosis 11/01/2023   Pulmonary nodules 11/01/2023   History of COPD 11/01/2023   Hyperthyroidism 11/07/2019   Hyperlipidemia 11/07/2019   Chest pain 10/08/2019   Chest pain with moderate risk for cardiac etiology 10/07/2019   Abdominal pain 12/24/2010   MYALGIA 06/19/2009   NUMBNESS 06/19/2009   DM2 (diabetes mellitus, type 2) (HCC) 01/24/2009   HYPERCHOLESTEROLEMIA 01/24/2009   SMOKER 01/24/2009   DEPRESSION 01/24/2009   Essential hypertension 01/24/2009   CHEST PAIN 01/24/2009   Neuropathy 01/24/2009   Iron deficiency anemia due to chronic blood loss 01/24/2009   Past Medical History:  Diagnosis Date   Blood transfusion    d/t hemmorrhage post c- section   DEPRESSION 01/24/2009   DM 01/24/2009   H/O blood transfusion reaction 09/1992   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 ENDOSCOPY;  Service: Gastroenterology;;   BREAST CYST EXCISION     BREAST REDUCTION SURGERY     CERVICAL CERCLAGE     CESAREAN SECTION     COLONOSCOPY WITH PROPOFOL  N/A  10/09/2019   Procedure: COLONOSCOPY WITH PROPOFOL ;  Surgeon: Saintclair Jasper, MD;  Location: [facility] ENDOSCOPY;  Service: Gastroenterology;  Laterality: N/A;   ESOPHAGOGASTRODUODENOSCOPY (EGD) WITH PROPOFOL  N/A 10/09/2019   Procedure: ESOPHAGOGASTRODUODENOSCOPY (EGD) WITH PROPOFOL ;  Surgeon: Saintclair Jasper, MD;  [facility];  Service: Gastroenterology;  Laterality: N/A;   HEMOSTASIS CLIP PLACEMENT  10/09/2019   Procedure: HEMOSTASIS CLIP PLACEMENT;  Surgeon: Saintclair Jasper, MD;  Location[facility] ENDOSCOPY;  Service: Gastroenterology;;   HOT HEMOSTASIS N/A 10/09/2019   Procedure: HOT HEMOSTASIS (ARGON PLASMA COAGULATION/BICAP);  Surgeon: Saintclair Jasper, MD;  Location: [facility] ENDOSCOPY;  Service: Gastroenterology;  Laterality: N/A;   JOINT REPLACEMENT     KNEE ARTHROPLASTY  07/14/2011   Procedure: COMPUTER ASSISTED TOTAL KNEE ARTHROPLASTY;  Surgeon: Cordella Glendia Hutchinson, MD;  Location: [facility] OR;  Service: Orthopedics;  Laterality: Left;  Left total knee arthroplasty  POLYPECTOMY  10/09/2019   Procedure: POLYPECTOMY;  Surgeon: Saintclair Jasper, MD;  Location: [facility] ENDOSCOPY;  Service: Gastroenterology;;   REDUCTION MAMMAPLASTY Bilateral    Social History   Occupational History   Occupation: Neurosurgeon: [employer]  Tobacco Use   Smoking status: Every Day    Current packs/day: 0.10    Average packs/day: 0.1 packs/day for 6.0 years (0.6 ttl pk-yrs)    Types: Cigarettes   Smokeless tobacco: Never  Substance and Sexual Activity   Alcohol use: Yes    Comment: once drink every few months   Drug use: No   Sexual activity: Not Currently

## 2024-01-01 ENCOUNTER — Encounter: Payer: Self-pay | Admitting: Orthopedic Surgery

## 2024-01-01 MED ORDER — LIDOCAINE HCL 1 % IJ SOLN
5.0000 mL | INTRAMUSCULAR | Status: AC | PRN
  Administered 2023-12-31: 5 mL

## 2024-01-12 ENCOUNTER — Encounter (HOSPITAL_COMMUNITY): Payer: Self-pay

## 2024-01-12 NOTE — Progress Notes (Signed)
 Left IVM for Debbie (the scheduler) for pre-op orders.

## 2024-01-12 NOTE — Progress Notes (Signed)
 PCP [facility] Cardiologist - Dr Redell Shallow (last ov 11/07/19)  Chest x-ray - 10/31/23 EKG - 10/31/23 Stress Test - 10/19/19 ECHO - 10/07/19 Cardiac Cath - n/a  ICD Pacemaker/Loop - n/a  Sleep Study -  n/a  Diabetes Type 2 Fasting Blood Sugar -  Checks Blood Sugar _____ times a day  Do not take Metformin  on the morning of surgery.  Hold Jandiance 72 hours prior to procedure.  Last dose 01/14/24.  THE NIGHT BEFORE SURGERY, take 4 units of Lantus  Insulin .      THE MORNING OF SURGERY, take 4 units Lantus  Insulin .  If your blood sugar is less than 70 mg/dL, you will need to treat for low blood sugar: Treat a low blood sugar (less than 70 mg/dL) with  cup of clear juice (cranberry or apple), 4 glucose tablets, OR glucose gel. Recheck blood sugar in 15 minutes after treatment (to make sure it is greater than 70 mg/dL). If your blood sugar is not greater than 70 mg/dL on recheck, call [phone] for further instructions.  Blood Thinner Instructions:  n/a  Aspirin  Instructions: Follow your surgeon's instructions on when to stop aspirin  prior to surgery,  If no instructions were given by your surgeon then you will need to call the office for those instructions.  ERAS -clear liquids til 8 AM DOS.   Anesthesia review: Yes  STOP now taking any Aspirin  (unless otherwise instructed by your surgeon), Aleve, Naproxen, Ibuprofen, Motrin, Advil, Goody's, BC's, all herbal medications, fish oil, and all vitamins.   Coronavirus Screening Do you have any of the following symptoms:  Cough yes/no: No Fever (>100.28F)  yes/no: No Runny nose yes/no: No Sore throat yes/no: No Difficulty breathing/shortness of breath  yes/no: No  Have you traveled in the last 14 days and where? yes/no: No  Patient verbalized understanding of instructions that were given to them at the PAT appointment. Patient was also instructed that they will need to review over the PAT instructions again at home  before surgery.

## 2024-01-12 NOTE — Pre-Procedure Instructions (Signed)
 Surgical Instructions   Your procedure is scheduled on Tuesday, October 14.  Report to [facility] Main Entrance A at 0900 A.M., then check in with the Admitting office. Any questions or running late day of surgery: call [phone]  Questions prior to your surgery date: call [phone], Monday-Friday, 8am-4pm. If you experience any cold or flu symptoms such as cough, fever, chills, shortness of breath, etc. between now and your scheduled surgery, please notify us  at the above number.     Remember:  Do not eat after midnight the night before your surgery  You may drink clear liquids until 0800 the morning of your surgery.   Clear liquids allowed are: Water, Non-Citrus Juices (without pulp), Carbonated Beverages, Clear Tea (no milk, honey, etc.), Black Coffee Only (NO MILK, CREAM OR POWDERED CREAMER of any kind), and Gatorade.    Take these medicines the morning of surgery with A SIP OF WATER: atorvastatin  (LIPITOR )  methimazole  (TAPAZOLE )  metoprolol  tartrate (LOPRESSOR )  pantoprazole  (PROTONIX )  timolol (TIMOPTIC) eye drops  May take these medicines IF NEEDED: acetaminophen  (TYLENOL )  albuterol  (VENTOLIN  HFA)  inhaler please bring it with you to the hospital gabapentin  (NEURONTIN )  ondansetron  (ZOFRAN -ODT)   Please follow your surgeon's instructions on when to stop Aspirin ; if no instructions were given- call dr. Brion office.  One week prior to surgery, STOP taking any Aleve, Naproxen, Ibuprofen, Motrin, Advil, Goody's, BC's, all herbal medications, fish oil, and non-prescription vitamins.  WHAT DO I DO ABOUT MY DIABETES MEDICATION?         Please hold empagliflozin (JARDIANCE) 72 hours prior to your surgery. Last dose should be no later than October 10th.    Do not take oral diabetes medicines (pills) the morning of surgery. This includes metFORMIN  (GLUCOPHAGE ); last dose should be on October 13th.  THE NIGHT BEFORE SURGERY, take 4 units of Lantus   insulin .       THE MORNING OF SURGERY, take 4 units of Lantus  insulin .  The day of surgery, do not take other diabetes injectables, including Byetta (exenatide), Bydureon (exenatide ER), Victoza  (liraglutide ), or Trulicity  (dulaglutide ).  If your CBG is greater than 220 mg/dL, you may take  of your sliding scale (correction) dose of insulin .   HOW TO MANAGE YOUR DIABETES BEFORE AND AFTER SURGERY  Why is it important to control my blood sugar before and after surgery? Improving blood sugar levels before and after surgery helps healing and can limit problems. A way of improving blood sugar control is eating a healthy diet by:  Eating less sugar and carbohydrates  Increasing activity/exercise  Talking with your doctor about reaching your blood sugar goals High blood sugars (greater than 180 mg/dL) can raise your risk of infections and slow your recovery, so you will need to focus on controlling your diabetes during the weeks before surgery. Make sure that the doctor who takes care of your diabetes knows about your planned surgery including the date and location.  How do I manage my blood sugar before surgery? Check your blood sugar at least 4 times a day, starting 2 days before surgery, to make sure that the level is not too high or low.  Check your blood sugar the morning of your surgery when you wake up and every 2 hours until you get to the Short Stay unit.  If your blood sugar is less than 70 mg/dL, you will need to treat for low blood sugar: Do not take insulin . Treat a low blood sugar (less than 70 mg/dL)  with  cup of clear juice (cranberry or apple), 4 glucose tablets, OR glucose gel. Recheck blood sugar in 15 minutes after treatment (to make sure it is greater than 70 mg/dL). If your blood sugar is not greater than 70 mg/dL on recheck, call [phone] for further instructions. Report your blood sugar to the short stay nurse when you get to Short Stay.  If you are admitted to  the hospital after surgery: Your blood sugar will be checked by the staff and you will probably be given insulin  after surgery (instead of oral diabetes medicines) to make sure you have good blood sugar levels. The goal for blood sugar control after surgery is 80-180 mg/dL.                      Do NOT Smoke (Tobacco/Vaping) for 24 hours prior to your procedure.  If you use a CPAP at night, you may bring your mask/headgear for your overnight stay.   You will be asked to remove any contacts, glasses, piercing's, hearing aid's, dentures/partials prior to surgery. Please bring cases for these items if needed.    Patients discharged the day of surgery will not be allowed to drive home, and someone needs to stay with them for 24 hours.  SURGICAL WAITING ROOM VISITATION Patients may have no more than 2 support people in the waiting area - these visitors may rotate.   Pre-op nurse will coordinate an appropriate time for 1 ADULT support person, who may not rotate, to accompany patient in pre-op.  Children under the age of 11 must have an adult with them who is not the patient and must remain in the main waiting area with an adult.  If the patient needs to stay at the hospital during part of their recovery, the visitor guidelines for inpatient rooms apply.  Please refer to the [facility] website for the visitor guidelines for any additional information.   If you received a COVID test during your pre-op visit  it is requested that you wear a mask when out in public, stay away from anyone that may not be feeling well and notify your surgeon if you develop symptoms. If you have been in contact with anyone that has tested positive in the last 10 days please notify you surgeon.      Pre-operative 4 CHG Bathing Instructions   You can play a key role in reducing the risk of infection after surgery. Your skin needs to be as free of germs as possible. You can reduce the number of germs on your skin by  washing with CHG (chlorhexidine  gluconate) soap before surgery. CHG is an antiseptic soap that kills germs and continues to kill germs even after washing.   DO NOT use if you have an allergy to chlorhexidine /CHG or antibacterial soaps. If your skin becomes reddened or irritated, stop using the CHG and notify one of our RNs at [phone].   Please shower with the CHG soap starting 4 days before surgery using the following schedule:     Please keep in mind the following:  DO NOT shave, including legs and underarms, starting the day of your first shower.   You may shave your face at any point before/day of surgery.  Place clean sheets on your bed the day you start using CHG soap. Use a clean washcloth (not used since being washed) for each shower. DO NOT sleep with pets once you start using the CHG.   CHG Shower Instructions:  Wash your face and private area with normal soap. If you choose to wash your hair, wash first with your normal shampoo.  After you use shampoo/soap, rinse your hair and body thoroughly to remove shampoo/soap residue.  Turn the water OFF and apply  bottle of CHG soap to a CLEAN washcloth.  Apply CHG soap ONLY FROM YOUR NECK DOWN TO YOUR TOES (washing for 3-5 minutes)  DO NOT use CHG soap on face, private areas, open wounds, or sores.  Pay special attention to the area where your surgery is being performed.  If you are having back surgery, having someone wash your back for you may be helpful. Wait 2 minutes after CHG soap is applied, then you may rinse off the CHG soap.  Pat dry with a clean towel  Put on clean clothes/pajamas   If you choose to wear lotion, please use ONLY the CHG-compatible lotions that are listed below.  Additional instructions for the day of surgery:  If you choose, you may shower the morning of surgery with an antibacterial soap.  DO NOT APPLY any lotions, deodorants, cologne, or perfumes.   Do not bring valuables to the hospital. [facility]  is not responsible for any belongings/valuables. Do not wear nail polish, gel polish, artificial nails, or any other type of covering on natural nails (fingers and toes) Do not wear jewelry or makeup Put on clean/comfortable clothes.  Please brush your teeth.  Ask your nurse before applying any prescription medications to the skin.     CHG Compatible Lotions   Aveeno Moisturizing lotion  Cetaphil Moisturizing Cream  Cetaphil Moisturizing Lotion  Clairol Herbal Essence Moisturizing Lotion, Dry Skin  Clairol Herbal Essence Moisturizing Lotion, Extra Dry Skin  Clairol Herbal Essence Moisturizing Lotion, Normal Skin  Curel Age Defying Therapeutic Moisturizing Lotion with Alpha Hydroxy  Curel Extreme Care Body Lotion  Curel Soothing Hands Moisturizing Hand Lotion  Curel Therapeutic Moisturizing Cream, Fragrance-Free  Curel Therapeutic Moisturizing Lotion, Fragrance-Free  Curel Therapeutic Moisturizing Lotion, Original Formula  Eucerin Daily Replenishing Lotion  Eucerin Dry Skin Therapy Plus Alpha Hydroxy Crme  Eucerin Dry Skin Therapy Plus Alpha Hydroxy Lotion  Eucerin Original Crme  Eucerin Original Lotion  Eucerin Plus Crme Eucerin Plus Lotion  Eucerin TriLipid Replenishing Lotion  Keri Anti-Bacterial Hand Lotion  Keri Deep Conditioning Original Lotion Dry Skin Formula Softly Scented  Keri Deep Conditioning Original Lotion, Fragrance Free Sensitive Skin Formula  Keri Lotion Fast Absorbing Fragrance Free Sensitive Skin Formula  Keri Lotion Fast Absorbing Softly Scented Dry Skin Formula  Keri Original Lotion  Keri Skin Renewal Lotion Keri Silky Smooth Lotion  Keri Silky Smooth Sensitive Skin Lotion  Nivea Body Creamy Conditioning Oil  Nivea Body Extra Enriched Lotion  Nivea Body Original Lotion  Nivea Body Sheer Moisturizing Lotion Nivea Crme  Nivea Skin Firming Lotion  NutraDerm 30 Skin Lotion  NutraDerm Skin Lotion  NutraDerm Therapeutic Skin Cream  NutraDerm  Therapeutic Skin Lotion  ProShield Protective Hand Cream  Provon moisturizing lotion  Please read over the following fact sheets that you were given.

## 2024-01-13 ENCOUNTER — Encounter (HOSPITAL_COMMUNITY): Payer: Self-pay

## 2024-01-13 ENCOUNTER — Other Ambulatory Visit: Payer: Self-pay

## 2024-01-13 ENCOUNTER — Encounter (HOSPITAL_COMMUNITY)
Admission: RE | Admit: 2024-01-13 | Discharge: 2024-01-13 | Disposition: A | Discharge status: Home / Self Care | Source: Ambulatory Visit | Attending: Orthopedic Surgery | Admitting: Orthopedic Surgery

## 2024-01-13 VITALS — BP 199/70 | HR 62 | Temp 98.2°F | Resp 16 | Ht 65.0 in | Wt 166.5 lb

## 2024-01-13 DIAGNOSIS — I10 Essential (primary) hypertension: Secondary | ICD-10-CM | POA: Insufficient documentation

## 2024-01-13 DIAGNOSIS — Z86711 Personal history of pulmonary embolism: Secondary | ICD-10-CM | POA: Diagnosis not present

## 2024-01-13 DIAGNOSIS — E7800 Pure hypercholesterolemia, unspecified: Secondary | ICD-10-CM | POA: Diagnosis not present

## 2024-01-13 DIAGNOSIS — F17210 Nicotine dependence, cigarettes, uncomplicated: Secondary | ICD-10-CM | POA: Insufficient documentation

## 2024-01-13 DIAGNOSIS — Z79899 Other long term (current) drug therapy: Secondary | ICD-10-CM | POA: Diagnosis not present

## 2024-01-13 DIAGNOSIS — D649 Anemia, unspecified: Secondary | ICD-10-CM | POA: Insufficient documentation

## 2024-01-13 DIAGNOSIS — Z01812 Encounter for preprocedural laboratory examination: Secondary | ICD-10-CM | POA: Diagnosis present

## 2024-01-13 DIAGNOSIS — M1711 Unilateral primary osteoarthritis, right knee: Secondary | ICD-10-CM | POA: Diagnosis not present

## 2024-01-13 DIAGNOSIS — E0590 Thyrotoxicosis, unspecified without thyrotoxic crisis or storm: Secondary | ICD-10-CM | POA: Diagnosis not present

## 2024-01-13 DIAGNOSIS — E119 Type 2 diabetes mellitus without complications: Secondary | ICD-10-CM | POA: Insufficient documentation

## 2024-01-13 DIAGNOSIS — Z01818 Encounter for other preprocedural examination: Secondary | ICD-10-CM

## 2024-01-13 HISTORY — DX: Anemia, unspecified: D64.9

## 2024-01-13 HISTORY — DX: Thyrotoxicosis, unspecified without thyrotoxic crisis or storm: E05.90

## 2024-01-13 LAB — BASIC METABOLIC PANEL WITH GFR
Anion gap: 11 (ref 5–15)
BUN: 25 mg/dL — ABNORMAL HIGH (ref 8–23)
CO2: 25 mmol/L (ref 22–32)
Calcium: 9.9 mg/dL (ref 8.9–10.3)
Chloride: 102 mmol/L (ref 98–111)
Creatinine, Ser: 0.93 mg/dL (ref 0.44–1.00)
GFR, Estimated: >60 mL/min (ref >60)
Glucose, Bld: 133 mg/dL — ABNORMAL HIGH (ref 70–99)
Potassium: 5.2 mmol/L — ABNORMAL HIGH (ref 3.5–5.1)
Sodium: 138 mmol/L (ref 135–145)

## 2024-01-13 LAB — CBC
HCT: 46.2 % — ABNORMAL HIGH (ref 36.0–46.0)
Hemoglobin: 15.2 g/dL — ABNORMAL HIGH (ref 12.0–15.0)
MCH: 30.6 pg (ref 26.0–34.0)
MCHC: 32.9 g/dL (ref 30.0–36.0)
MCV: 93.1 fL (ref 80.0–100.0)
Platelets: 309 K/uL (ref 150–400)
RBC: 4.96 MIL/uL (ref 3.87–5.11)
RDW: 13.6 % (ref 11.5–15.5)
WBC: 10.6 K/uL — ABNORMAL HIGH (ref 4.0–10.5)
nRBC: 0 % (ref 0.0–0.2)

## 2024-01-13 LAB — SURGICAL PCR SCREEN
MRSA, PCR: NEGATIVE
Staphylococcus aureus: POSITIVE — AB

## 2024-01-13 LAB — HEMOGLOBIN A1C
Hgb A1c MFr Bld: 6.9 % — ABNORMAL HIGH (ref 4.8–5.6)
Mean Plasma Glucose: 151.33 mg/dL

## 2024-01-13 LAB — GLUCOSE, CAPILLARY: Glucose-Capillary: 153 mg/dL — ABNORMAL HIGH (ref 70–99)

## 2024-01-13 NOTE — Pre-Procedure Instructions (Signed)
 Surgical Instructions   Your procedure is scheduled on Tuesday, October 14.  Report to [facility] Main Entrance A at 0900 A.M., then check in with the Admitting office. Any questions or running late day of surgery: call [phone]  Questions prior to your surgery date: call [phone], Monday-Friday, 8am-4pm. If you experience any cold or flu symptoms such as cough, fever, chills, shortness of breath, etc. between now and your scheduled surgery, please notify us  at the above number.     Remember:  Do not eat after midnight the night before your surgery  You may drink clear liquids until 0800 the morning of your surgery.   Clear liquids allowed are: Water, Non-Citrus Juices (without pulp), Carbonated Beverages, Clear Tea (no milk, honey, etc.), Black Coffee Only (NO MILK, CREAM OR POWDERED CREAMER of any kind), and Gatorade.    Take these medicines the morning of surgery with A SIP OF WATER: atorvastatin  (LIPITOR )  methimazole  (TAPAZOLE )  metoprolol  tartrate (LOPRESSOR )  pantoprazole  (PROTONIX )  timolol (TIMOPTIC) eye drops  May take these medicines IF NEEDED: acetaminophen  (TYLENOL )  albuterol  (VENTOLIN  HFA)  inhaler please bring it with you to the hospital gabapentin  (NEURONTIN )  ondansetron  (ZOFRAN -ODT)   Please follow your surgeon's instructions on when to stop Aspirin ; if no instructions were given- call dr. Brion office.  One week prior to surgery, STOP taking any Aleve, Naproxen, Ibuprofen, Motrin, Advil, Goody's, BC's, all herbal medications, fish oil, and non-prescription vitamins.  WHAT DO I DO ABOUT MY DIABETES MEDICATION?         Please hold empagliflozin (JARDIANCE) 72 hours prior to your surgery. Last dose should be no later than October 10th.    Do not take oral diabetes medicines (pills) the morning of surgery. This includes metFORMIN  (GLUCOPHAGE ); last dose should be on October 13th.  THE MORNING OF SURGERY, take 4 units of Lantus  insulin .  The day  of surgery, do not take other diabetes injectables, including Byetta (exenatide), Bydureon (exenatide ER), Victoza  (liraglutide ), or Trulicity  (dulaglutide ).  If your CBG is greater than 220 mg/dL, you may take  of your sliding scale (correction) dose of insulin .   HOW TO MANAGE YOUR DIABETES BEFORE AND AFTER SURGERY  Why is it important to control my blood sugar before and after surgery? Improving blood sugar levels before and after surgery helps healing and can limit problems. A way of improving blood sugar control is eating a healthy diet by:  Eating less sugar and carbohydrates  Increasing activity/exercise  Talking with your doctor about reaching your blood sugar goals High blood sugars (greater than 180 mg/dL) can raise your risk of infections and slow your recovery, so you will need to focus on controlling your diabetes during the weeks before surgery. Make sure that the doctor who takes care of your diabetes knows about your planned surgery including the date and location.  How do I manage my blood sugar before surgery? Check your blood sugar at least 4 times a day, starting 2 days before surgery, to make sure that the level is not too high or low.  Check your blood sugar the morning of your surgery when you wake up and every 2 hours until you get to the Short Stay unit.  If your blood sugar is less than 70 mg/dL, you will need to treat for low blood sugar: Do not take insulin . Treat a low blood sugar (less than 70 mg/dL) with  cup of clear juice (cranberry or apple), 4 glucose tablets, OR glucose gel. Recheck  blood sugar in 15 minutes after treatment (to make sure it is greater than 70 mg/dL). If your blood sugar is not greater than 70 mg/dL on recheck, call [phone] for further instructions. Report your blood sugar to the short stay nurse when you get to Short Stay.  If you are admitted to the hospital after surgery: Your blood sugar will be checked by the staff and you  will probably be given insulin  after surgery (instead of oral diabetes medicines) to make sure you have good blood sugar levels. The goal for blood sugar control after surgery is 80-180 mg/dL.                      Do NOT Smoke (Tobacco/Vaping) for 24 hours prior to your procedure.  If you use a CPAP at night, you may bring your mask/headgear for your overnight stay.   You will be asked to remove any contacts, glasses, piercing's, hearing aid's, dentures/partials prior to surgery. Please bring cases for these items if needed.    Patients discharged the day of surgery will not be allowed to drive home, and someone needs to stay with them for 24 hours.  SURGICAL WAITING ROOM VISITATION Patients may have no more than 2 support people in the waiting area - these visitors may rotate.   Pre-op nurse will coordinate an appropriate time for 1 ADULT support person, who may not rotate, to accompany patient in pre-op.  Children under the age of 47 must have an adult with them who is not the patient and must remain in the main waiting area with an adult.  If the patient needs to stay at the hospital during part of their recovery, the visitor guidelines for inpatient rooms apply.  Please refer to the [facility] website for the visitor guidelines for any additional information.   If you received a COVID test during your pre-op visit  it is requested that you wear a mask when out in public, stay away from anyone that may not be feeling well and notify your surgeon if you develop symptoms. If you have been in contact with anyone that has tested positive in the last 10 days please notify you surgeon.      Pre-operative 4 CHG Bathing Instructions   You can play a key role in reducing the risk of infection after surgery. Your skin needs to be as free of germs as possible. You can reduce the number of germs on your skin by washing with CHG (chlorhexidine  gluconate) soap before surgery. CHG is an antiseptic  soap that kills germs and continues to kill germs even after washing.   DO NOT use if you have an allergy to chlorhexidine /CHG or antibacterial soaps. If your skin becomes reddened or irritated, stop using the CHG and notify one of our RNs at [phone].   Please shower with the CHG soap starting 4 days before surgery using the following schedule:     Please keep in mind the following:  DO NOT shave, including legs and underarms, starting the day of your first shower.   You may shave your face at any point before/day of surgery.  Place clean sheets on your bed the day you start using CHG soap. Use a clean washcloth (not used since being washed) for each shower. DO NOT sleep with pets once you start using the CHG.   CHG Shower Instructions:  Wash your face and private area with normal soap. If you choose to wash your  hair, wash first with your normal shampoo.  After you use shampoo/soap, rinse your hair and body thoroughly to remove shampoo/soap residue.  Turn the water OFF and apply  bottle of CHG soap to a CLEAN washcloth.  Apply CHG soap ONLY FROM YOUR NECK DOWN TO YOUR TOES (washing for 3-5 minutes)  DO NOT use CHG soap on face, private areas, open wounds, or sores.  Pay special attention to the area where your surgery is being performed.  If you are having back surgery, having someone wash your back for you may be helpful. Wait 2 minutes after CHG soap is applied, then you may rinse off the CHG soap.  Pat dry with a clean towel  Put on clean clothes/pajamas   If you choose to wear lotion, please use ONLY the CHG-compatible lotions that are listed below.  Additional instructions for the day of surgery:  If you choose, you may shower the morning of surgery with an antibacterial soap.  DO NOT APPLY any lotions, deodorants, cologne, or perfumes.   Do not bring valuables to the hospital. [facility] is not responsible for any belongings/valuables. Do not wear nail polish, gel  polish, artificial nails, or any other type of covering on natural nails (fingers and toes) Do not wear jewelry or makeup Put on clean/comfortable clothes.  Please brush your teeth.  Ask your nurse before applying any prescription medications to the skin.     CHG Compatible Lotions   Aveeno Moisturizing lotion  Cetaphil Moisturizing Cream  Cetaphil Moisturizing Lotion  Clairol Herbal Essence Moisturizing Lotion, Dry Skin  Clairol Herbal Essence Moisturizing Lotion, Extra Dry Skin  Clairol Herbal Essence Moisturizing Lotion, Normal Skin  Curel Age Defying Therapeutic Moisturizing Lotion with Alpha Hydroxy  Curel Extreme Care Body Lotion  Curel Soothing Hands Moisturizing Hand Lotion  Curel Therapeutic Moisturizing Cream, Fragrance-Free  Curel Therapeutic Moisturizing Lotion, Fragrance-Free  Curel Therapeutic Moisturizing Lotion, Original Formula  Eucerin Daily Replenishing Lotion  Eucerin Dry Skin Therapy Plus Alpha Hydroxy Crme  Eucerin Dry Skin Therapy Plus Alpha Hydroxy Lotion  Eucerin Original Crme  Eucerin Original Lotion  Eucerin Plus Crme Eucerin Plus Lotion  Eucerin TriLipid Replenishing Lotion  Keri Anti-Bacterial Hand Lotion  Keri Deep Conditioning Original Lotion Dry Skin Formula Softly Scented  Keri Deep Conditioning Original Lotion, Fragrance Free Sensitive Skin Formula  Keri Lotion Fast Absorbing Fragrance Free Sensitive Skin Formula  Keri Lotion Fast Absorbing Softly Scented Dry Skin Formula  Keri Original Lotion  Keri Skin Renewal Lotion Keri Silky Smooth Lotion  Keri Silky Smooth Sensitive Skin Lotion  Nivea Body Creamy Conditioning Oil  Nivea Body Extra Enriched Lotion  Nivea Body Original Lotion  Nivea Body Sheer Moisturizing Lotion Nivea Crme  Nivea Skin Firming Lotion  NutraDerm 30 Skin Lotion  NutraDerm Skin Lotion  NutraDerm Therapeutic Skin Cream  NutraDerm Therapeutic Skin Lotion  ProShield Protective Hand Cream  Provon moisturizing  lotion  Please read over the following fact sheets that you were given.

## 2024-01-14 ENCOUNTER — Encounter: Payer: Self-pay | Admitting: Orthopedic Surgery

## 2024-01-14 NOTE — Progress Notes (Signed)
 Anesthesia Chart Review:  Case: [id number] Date/Time: 01/18/24 1045   Procedure: ARTHROPLASTY, KNEE, TOTAL (Right: Knee)   Anesthesia type: General   Diagnosis: Primary osteoarthritis of right knee [M17.11]   Pre-op diagnosis: right knee osteoarthritis   [facility] ROOM 04 / [facility]   Surgeons: Addie Cordella Hamilton, MD       DISCUSSION: Patient is a 68 year old female scheduled for the above procedure.  History includes smoking, HTN, hypercholesterolemia, DM2, PE (postpartum after hemorrhage post c-section 09/1992), anemia, hyperthyroidism (on Tapzaole, metoprolol ), osteoarthritis (left TKA 07/14/2011), reduction mammaplasty (2005).   EKG from July 2025 (brief admission for hypokalemia K 2.9, transient SOB, suspected URI symptoms) appears overall stable from 10/2019, although with new single PVC and QT interval increased (consider related to hypokalemia). In July 2021 she had a non-ischemic stress test, and echocardiogram showed normal LV function with an EF of 65 to 70%, normal PASP, trivial MR.   TSH normal in August 2025 [facility]).  Anesthesia team to evaluate on the day of surgery.  Case is posted for general anesthesia (per preference per Ortho notes). Of note, her BP was elevated at BP, but had not taken metoprolol . BP 104/60 11/10/2023 [facility]).   VS: BP (!) 199/70 Comment: Patient not taking metoprolol  as rx- advised pt to take both BP meds as drescribed during PAT appt  Pulse 62   Temp 36.8 C   Resp 16   Ht 5' 5 (1.651 m)   Wt 75.5 kg   SpO2 100%   BMI 27.71 kg/m  BP Readings from Last 3 Encounters:  01/13/24 (!) 199/70  11/01/23 (!) 167/74  08/31/21 138/90    PROVIDERS: Vernon Velna SAUNDERS, MD is PCP  Faythe Purchase, MD is endocrinologist - She is not routinely followed by cardiology.  Had cardiology consult by Pietro Rogue, MD in July 2021 during admission for dysepnea, tachycardia in setting of anemia (HGB 9.8) and new low TSH (0.01). Glucose 455. Troponins  peaked at 21. GI work-up revealed gastric and colonic AVMs which were treated. B-blocker as added with out-patient endocrinology follow-up for hyperthyroidism. Echocardiogram shows normal LV function with an EF of 65 to 70%, normal PASP, trivial MR. Out-patient stress test in 10/2019 was non-ischemic. Elevated troponins suspected related to demand ischemia in setting of anemia and hyperthyroidism. Last visit 11/07/2019 with APP   LABS: Preoperative labs noted. K 5.2 (no mention of hemolysis), Cr 0.94. H/H 15.,2/46.2. A1c 6.9%.  (all labs ordered are listed, but only abnormal results are displayed)  Labs Reviewed  SURGICAL PCR SCREEN - Abnormal; Notable for the following components:      Result Value   Staphylococcus aureus POSITIVE (*)    All other components within normal limits  GLUCOSE, CAPILLARY - Abnormal; Notable for the following components:   Glucose-Capillary 153 (*)    All other components within normal limits  CBC - Abnormal; Notable for the following components:   WBC 10.6 (*)    Hemoglobin 15.2 (*)    HCT 46.2 (*)    All other components within normal limits  BASIC METABOLIC PANEL WITH GFR - Abnormal; Notable for the following components:   Potassium 5.2 (*)    Glucose, Bld 133 (*)    BUN 25 (*)    All other components within normal limits  HEMOGLOBIN A1C - Abnormal; Notable for the following components:   Hgb A1c MFr Bld 6.9 (*)    All other components within normal limits   TSH 2.63, Free T4 0.55,  on 11/16/2023 [facility]). HFP normal 10/14/2023 ([facility]).   IMAGES: Xray right knee 12/31/2023: AP lateral radiographs right knee reviewed. Mild valgus alignment.   End-stage arthritis present in the lateral compartment.  No acute fracture   MRI Right Knee 11/03/2023: IMPRESSION: - Moderate tricompartmental osteoarthrosis most notably the lateral compartment with significant chondromalacia and subchondral reactive edema - Complex tear of the anterior horn and body of the  lateral meniscus. - Moderate joint effusion. - See above for more detail.  CTA Chest 11/01/2023: IMPRESSION: 1. No acute chest CT or CTA findings. No evidence of pulmonary embolus. 2. Emphysema and diffuse bronchitis. 3. Numerous tiny nodules in the upper lobes, occasional nodules in the lower and right middle lobes, most of which are in the periphery although some were randomly distributed. At least some of this is probably due to smoking related respiratory bronchiolitis. 4. The more randomly distributed referenced nodules as well is others are all unchanged. No new or interval enlarged nodules. Continue low-dose screening annual CT with next study recommended in 12 months. 5. Aortic and coronary artery atherosclerosis. 6. Small hiatal hernia. 7. Hepatic steatosis. 8. Stable nodular thickening in the adrenal glands. - Aortic Atherosclerosis (ICD10-I70.0) and Emphysema (ICD10-J43.9).  CT Chest LCS 10/05/2023: MPRESSION: Lung-RADS 2, benign appearance or behavior. Continue annual screening with low-dose chest CT without contrast in 12 months. - Aortic Atherosclerosis (ICD10-I70.0) and Emphysema (ICD10-J43.9).  NM Gastric Emptying Study 04/30/2023: IMPRESSION: No scintigraphic evidence of delayed gastric emptying.    EKG: EKG 11/01/2023: Sinus rhythm with occasional Premature ventricular complexes ST & T wave abnormality, consider inferior ischemia Prolonged QT (QT/QTcB 446/474 ms) Abnormal ECG When compared with ECG of 07-Oct-2019 08:41, Premature ventricular complexes are now present Confirmed by Raford Lenis ([id number]) on 10/31/2023 11:53:44 PM - Previously QT/QTcB 336/428 ms, similar inferior ST/T wave abnormality on 73/2021 tracing with non-ischemic stress test around that time.   CV: Nuclear stress test 10/19/2019: Nuclear stress EF: 75%. The left ventricular ejection fraction is hyperdynamic (>65%). This is a low risk study. There is no evidence of ischemia. There is no  evidence of previous infarction. The study is normal.    Echo 10/07/2019: IMPRESSIONS   1. Left ventricular ejection fraction, by estimation, is 65 to 70%. The  left ventricle has normal function. The left ventricle has no regional  wall motion abnormalities. Left ventricular diastolic parameters are  indeterminate.   2. Right ventricular systolic function is normal. The right ventricular  size is normal. There is normal pulmonary artery systolic pressure. The  estimated right ventricular systolic pressure is 24.5 mmHg.   3. The mitral valve is grossly normal. Trivial mitral valve  regurgitation.   4. The aortic valve is tricuspid. Aortic valve regurgitation is not  visualized.   5. The inferior vena cava is normal in size with greater than 50%  respiratory variability, suggesting right atrial pressure of 3 mmHg.   Past Medical History:  Diagnosis Date   Anemia    tx w/iron   Blood transfusion    d/t hemmorrhage post c- section   DEPRESSION 01/24/2009   DM 01/24/2009   type 2   H/O blood transfusion reaction 09/1992   cause PE and hemolytic type reaction per pt   History of one miscarriage    HYPERCHOLESTEROLEMIA 01/24/2009   HYPERTENSION 01/24/2009   Hyperthyroidism    per pt on 01/13/24   Osteoarthritis    Pulmonary embolism (HCC)    result of blood transfusion reaction 18 years ago  after childbirth    Past Surgical History:  Procedure Laterality Date   BIOPSY  10/09/2019   Procedure: BIOPSY;  Surgeon: Saintclair Jasper, MD;  [facility] ENDOSCOPY;  Service: Gastroenterology;;   BREAST CYST EXCISION     BREAST REDUCTION SURGERY     CERVICAL CERCLAGE     CESAREAN SECTION     COLONOSCOPY WITH PROPOFOL  N/A 10/09/2019   Procedure: COLONOSCOPY WITH PROPOFOL ;  Surgeon: Saintclair Jasper, MD;  Location: [facility] ENDOSCOPY;  Service: Gastroenterology;  Laterality: N/A;   ESOPHAGOGASTRODUODENOSCOPY (EGD) WITH PROPOFOL  N/A 10/09/2019   Procedure: ESOPHAGOGASTRODUODENOSCOPY (EGD) WITH  PROPOFOL ;  Surgeon: Saintclair Jasper, MD;  Location: [facility] ENDOSCOPY;  Service: Gastroenterology;  Laterality: N/A;   HEMOSTASIS CLIP PLACEMENT  10/09/2019   Procedure: HEMOSTASIS CLIP PLACEMENT;  Surgeon: Saintclair Jasper, MD;  Location[facility] ENDOSCOPY;  Service: Gastroenterology;;   HOT HEMOSTASIS N/A 10/09/2019   Procedure: HOT HEMOSTASIS (ARGON PLASMA COAGULATION/BICAP);  Surgeon: Saintclair Jasper, MD;  Location: [facility] ENDOSCOPY;  Service: Gastroenterology;  Laterality: N/A;   JOINT REPLACEMENT     KNEE ARTHROPLASTY  07/14/2011   Procedure: COMPUTER ASSISTED TOTAL LEFT  KNEE ARTHROPLASTY;  Surgeon: Cordella Glendia Hutchinson, MD;  Location: [facility] OR;  Service: Orthopedics;  Laterality: Left;  Left total knee arthroplasty   POLYPECTOMY  10/09/2019   Procedure: POLYPECTOMY;  Surgeon: Saintclair Jasper, MD;  Location: [facility] ENDOSCOPY;  Service: Gastroenterology;;   REDUCTION MAMMAPLASTY Bilateral     MEDICATIONS:  acetaminophen  (TYLENOL ) 500 MG tablet   albuterol  (VENTOLIN  HFA) 108 (90 Base) MCG/ACT inhaler   amoxicillin  (AMOXIL ) 500 MG capsule   aspirin  81 MG chewable tablet   atorvastatin  (LIPITOR ) 40 MG tablet   Cholecalciferol 250 MCG (10000 UT) CAPS   empagliflozin (JARDIANCE) 10 MG TABS tablet   ferrous sulfate  325 (65 FE) MG tablet   gabapentin  (NEURONTIN ) 100 MG capsule   LANTUS  SOLOSTAR 100 UNIT/ML Solostar Pen   metFORMIN  (GLUCOPHAGE ) 500 MG tablet   methimazole  (TAPAZOLE ) 10 MG tablet   metoprolol  tartrate (LOPRESSOR ) 50 MG tablet   nitroGLYCERIN  (NITROSTAT ) 0.4 MG SL tablet   olmesartan  (BENICAR ) 40 MG tablet   ondansetron  (ZOFRAN -ODT) 4 MG disintegrating tablet   pantoprazole  (PROTONIX ) 40 MG tablet   potassium chloride  SA (KLOR-CON  M) 20 MEQ tablet   timolol (TIMOPTIC) 0.5 % ophthalmic solution   zolpidem  (AMBIEN ) 10 MG tablet   No current facility-administered medications for this encounter.  Amoxicillin  is predental.  To hold Jardiance 72 hours prior to surgery, last dose planned for 01/14/2024.      Isaiah Ruder, PA-C Surgical Short Stay/Anesthesiology [facility] Phone [phone] [facility] Phone [phone] 01/14/2024 10:01 PM

## 2024-01-14 NOTE — Anesthesia Preprocedure Evaluation (Addendum)
 Anesthesia Evaluation  Patient identified by MRN, date of birth, ID band Patient awake    Reviewed: Allergy & Precautions, NPO status , Patient's Chart, lab work & pertinent test results  History of Anesthesia Complications Negative for: history of anesthetic complications  Airway Mallampati: I  TM Distance: >3 FB     Dental  (+) Dental Advisory Given, Teeth Intact   Pulmonary neg shortness of breath, neg COPD, neg recent URI, Current Smoker and Patient abstained from smoking.   breath sounds clear to auscultation       Cardiovascular hypertension, Pt. on medications (-) angina (-) Past MI and (-) CHF  Rhythm:Regular  1. Left ventricular ejection fraction, by estimation, is 65 to 70%. The  left ventricle has normal function. The left ventricle has no regional  wall motion abnormalities. Left ventricular diastolic parameters are  indeterminate.   2. Right ventricular systolic function is normal. The right ventricular  size is normal. There is normal pulmonary artery systolic pressure. The  estimated right ventricular systolic pressure is 24.5 mmHg.   3. The mitral valve is grossly normal. Trivial mitral valve  regurgitation.   4. The aortic valve is tricuspid. Aortic valve regurgitation is not  visualized.   5. The inferior vena cava is normal in size with greater than 50%  respiratory variability, suggesting right atrial pressure of 3 mmHg.   FINDINGS     Neuro/Psych  PSYCHIATRIC DISORDERS  Depression    negative neurological ROS     GI/Hepatic negative GI ROS, Neg liver ROS,,,  Endo/Other  diabetes, Type 2, Insulin  DependentHypothyroidism    Renal/GU negative Renal ROSLab Results      Component                Value               Date                      NA                       138                 01/13/2024                K                        5.2 (H)             01/13/2024                CO2                       25                  01/13/2024                GLUCOSE                  133 (H)             01/13/2024                BUN                      25 (H)              01/13/2024  CREATININE               0.93                01/13/2024                CALCIUM                   9.9                 01/13/2024                GFR                      104.52              11/15/2014                GFRNONAA                 >60                 01/13/2024                Musculoskeletal  (+) Arthritis ,    Abdominal   Peds  Hematology negative hematology ROS (+) Lab Results      Component                Value               Date                      WBC                      10.6 (H)            01/13/2024                HGB                      15.2 (H)            01/13/2024                HCT                      46.2 (H)            01/13/2024                MCV                      93.1                01/13/2024                PLT                      309                 01/13/2024              Anesthesia Other Findings   Reproductive/Obstetrics                              Anesthesia Physical Anesthesia Plan  ASA: 2  Anesthesia Plan: MAC, Regional and Spinal   Post-op Pain Management: Regional block*   Induction:   PONV  Risk Score and Plan: 1 and Propofol  infusion and Treatment may vary due to age or medical condition  Airway Management Planned: Nasal Cannula, Natural Airway and Simple Face Mask  Additional Equipment: None  Intra-op Plan:   Post-operative Plan:   Informed Consent: I have reviewed the patients History and Physical, chart, labs and discussed the procedure including the risks, benefits and alternatives for the proposed anesthesia with the patient or authorized representative who has indicated his/her understanding and acceptance.     Dental advisory given  Plan Discussed with: CRNA  Anesthesia Plan Comments: (PAT note  written 01/14/2024 by Allison Zelenak, PA-C.  )         Anesthesia Quick Evaluation

## 2024-01-18 ENCOUNTER — Encounter (HOSPITAL_COMMUNITY): Payer: Self-pay | Admitting: Orthopedic Surgery

## 2024-01-18 ENCOUNTER — Ambulatory Visit (HOSPITAL_COMMUNITY): Payer: Self-pay | Admitting: Vascular Surgery

## 2024-01-18 ENCOUNTER — Ambulatory Visit (HOSPITAL_COMMUNITY)

## 2024-01-18 ENCOUNTER — Encounter (HOSPITAL_COMMUNITY): Admission: RE | Disposition: A | Payer: Self-pay | Source: Home / Self Care | Attending: Orthopedic Surgery

## 2024-01-18 ENCOUNTER — Observation Stay (HOSPITAL_COMMUNITY)
Admission: RE | Admit: 2024-01-18 | Discharge: 2024-01-19 | Disposition: A | Discharge status: Home / Self Care | Attending: Orthopedic Surgery | Admitting: Orthopedic Surgery

## 2024-01-18 DIAGNOSIS — Z96651 Presence of right artificial knee joint: Principal | ICD-10-CM | POA: Insufficient documentation

## 2024-01-18 DIAGNOSIS — Z794 Long term (current) use of insulin: Secondary | ICD-10-CM | POA: Insufficient documentation

## 2024-01-18 DIAGNOSIS — Z7982 Long term (current) use of aspirin: Secondary | ICD-10-CM | POA: Diagnosis not present

## 2024-01-18 DIAGNOSIS — I10 Essential (primary) hypertension: Secondary | ICD-10-CM | POA: Diagnosis not present

## 2024-01-18 DIAGNOSIS — F1090 Alcohol use, unspecified, uncomplicated: Secondary | ICD-10-CM | POA: Insufficient documentation

## 2024-01-18 DIAGNOSIS — M1711 Unilateral primary osteoarthritis, right knee: Principal | ICD-10-CM

## 2024-01-18 DIAGNOSIS — F17210 Nicotine dependence, cigarettes, uncomplicated: Secondary | ICD-10-CM | POA: Diagnosis not present

## 2024-01-18 DIAGNOSIS — E119 Type 2 diabetes mellitus without complications: Secondary | ICD-10-CM | POA: Diagnosis not present

## 2024-01-18 DIAGNOSIS — Z79899 Other long term (current) drug therapy: Secondary | ICD-10-CM | POA: Insufficient documentation

## 2024-01-18 HISTORY — PX: TOTAL KNEE ARTHROPLASTY: SHX125

## 2024-01-18 LAB — GLUCOSE, CAPILLARY
Glucose-Capillary: 204 mg/dL — ABNORMAL HIGH (ref 70–99)
Glucose-Capillary: 191 mg/dL — ABNORMAL HIGH (ref 70–99)
Glucose-Capillary: 182 mg/dL — ABNORMAL HIGH (ref 70–99)

## 2024-01-18 SURGERY — ARTHROPLASTY, KNEE, TOTAL
Anesthesia: Monitor Anesthesia Care | Site: Knee | Laterality: Right

## 2024-01-18 MED ORDER — METOCLOPRAMIDE HCL 5 MG PO TABS: 5.0000 mg | ORAL_TABLET | Freq: Three times a day (TID) | ORAL | Status: DC | PRN

## 2024-01-18 MED ORDER — LACTATED RINGERS IV SOLN: INTRAVENOUS | Status: DC

## 2024-01-18 MED ORDER — LIDOCAINE-EPINEPHRINE (PF) 1.5 %-1:200000 IJ SOLN
INTRAMUSCULAR | Status: DC | PRN
  Administered 2024-01-18: 5 mL via PERINEURAL

## 2024-01-18 MED ORDER — LIDOCAINE 2% (20 MG/ML) 5 ML SYRINGE
INTRAMUSCULAR | Status: DC | PRN
  Administered 2024-01-18: 60 mg via INTRAVENOUS

## 2024-01-18 MED ORDER — TRANEXAMIC ACID 1000 MG/10ML IV SOLN
INTRAVENOUS | Status: DC | PRN
  Administered 2024-01-18: 2000 mg via TOPICAL

## 2024-01-18 MED ORDER — DOCUSATE SODIUM 100 MG PO CAPS
100.0000 mg | ORAL_CAPSULE | Freq: Two times a day (BID) | ORAL | Status: DC
  Administered 2024-01-18 – 2024-01-19 (×2): 100 mg via ORAL
  Filled 2024-01-18 (×2): qty 1

## 2024-01-18 MED ORDER — PROPOFOL 10 MG/ML IV BOLUS
INTRAVENOUS | Status: DC | PRN
  Administered 2024-01-18: 80 mg via INTRAVENOUS
  Administered 2024-01-18: 100 ug/kg/min via INTRAVENOUS
  Administered 2024-01-18: 150 ug/kg/min via INTRAVENOUS

## 2024-01-18 MED ORDER — MIDAZOLAM HCL 2 MG/2ML IJ SOLN
INTRAMUSCULAR | Status: AC
  Filled 2024-01-18: qty 2

## 2024-01-18 MED ORDER — CEFAZOLIN SODIUM-DEXTROSE 2-4 GM/100ML-% IV SOLN
2.0000 g | INTRAVENOUS | Status: AC
  Administered 2024-01-18: 2 g via INTRAVENOUS
  Filled 2024-01-18: qty 100

## 2024-01-18 MED ORDER — METHOCARBAMOL 1000 MG/10ML IJ SOLN: 500.0000 mg | Freq: Four times a day (QID) | INTRAMUSCULAR | Status: DC | PRN

## 2024-01-18 MED ORDER — AMISULPRIDE (ANTIEMETIC) 5 MG/2ML IV SOLN
INTRAVENOUS | Status: AC
  Filled 2024-01-18: qty 4

## 2024-01-18 MED ORDER — SODIUM CHLORIDE 0.9 % IV SOLN: Freq: Once | INTRAVENOUS | Status: AC

## 2024-01-18 MED ORDER — BUPIVACAINE LIPOSOME 1.3 % IJ SUSP
INTRAMUSCULAR | Status: AC
  Filled 2024-01-18: qty 20

## 2024-01-18 MED ORDER — PHENYLEPHRINE HCL-NACL 20-0.9 MG/250ML-% IV SOLN
INTRAVENOUS | Status: DC | PRN
  Administered 2024-01-18: 20 ug/min via INTRAVENOUS

## 2024-01-18 MED ORDER — METHOCARBAMOL 500 MG PO TABS
500.0000 mg | ORAL_TABLET | Freq: Four times a day (QID) | ORAL | Status: DC | PRN
  Administered 2024-01-18 – 2024-01-19 (×2): 500 mg via ORAL
  Filled 2024-01-18 (×2): qty 1

## 2024-01-18 MED ORDER — MORPHINE SULFATE (PF) 4 MG/ML IV SOLN
INTRAVENOUS | Status: AC
  Filled 2024-01-18: qty 2

## 2024-01-18 MED ORDER — PANTOPRAZOLE SODIUM 40 MG PO TBEC
40.0000 mg | DELAYED_RELEASE_TABLET | Freq: Every day | ORAL | Status: DC
  Administered 2024-01-18 – 2024-01-19 (×2): 40 mg via ORAL
  Filled 2024-01-18 (×2): qty 1

## 2024-01-18 MED ORDER — VANCOMYCIN HCL 1000 MG IV SOLR
INTRAVENOUS | Status: AC
Start: 2024-01-18 — End: 2024-01-18
  Filled 2024-01-18: qty 20

## 2024-01-18 MED ORDER — METHIMAZOLE 10 MG PO TABS
20.0000 mg | ORAL_TABLET | Freq: Every day | ORAL | Status: DC
  Administered 2024-01-18 – 2024-01-19 (×2): 20 mg via ORAL
  Filled 2024-01-18 (×2): qty 2

## 2024-01-18 MED ORDER — SODIUM CHLORIDE 0.9 % IR SOLN
Status: DC | PRN
  Administered 2024-01-18: 3000 mL

## 2024-01-18 MED ORDER — VANCOMYCIN HCL 1000 MG IV SOLR
INTRAVENOUS | Status: DC | PRN
  Administered 2024-01-18: 1000 mg

## 2024-01-18 MED ORDER — FENTANYL CITRATE (PF) 100 MCG/2ML IJ SOLN
25.0000 ug | INTRAMUSCULAR | Status: DC | PRN
  Administered 2024-01-18: 50 ug via INTRAVENOUS

## 2024-01-18 MED ORDER — ONDANSETRON HCL 4 MG/2ML IJ SOLN
INTRAMUSCULAR | Status: AC
  Filled 2024-01-18: qty 2

## 2024-01-18 MED ORDER — PHENYLEPHRINE 80 MCG/ML (10ML) SYRINGE FOR IV PUSH (FOR BLOOD PRESSURE SUPPORT)
PREFILLED_SYRINGE | INTRAVENOUS | Status: DC | PRN
  Administered 2024-01-18 (×2): 80 ug via INTRAVENOUS

## 2024-01-18 MED ORDER — MIDAZOLAM HCL 2 MG/2ML IJ SOLN
INTRAMUSCULAR | Status: DC | PRN
  Administered 2024-01-18: 2 mg via INTRAVENOUS

## 2024-01-18 MED ORDER — GABAPENTIN 100 MG PO CAPS
100.0000 mg | ORAL_CAPSULE | Freq: Three times a day (TID) | ORAL | Status: DC
  Administered 2024-01-18 – 2024-01-19 (×3): 100 mg via ORAL
  Filled 2024-01-18 (×3): qty 1

## 2024-01-18 MED ORDER — ACETAMINOPHEN 10 MG/ML IV SOLN
INTRAVENOUS | Status: AC
Start: 2024-01-18 — End: 2024-01-18
  Filled 2024-01-18: qty 100

## 2024-01-18 MED ORDER — ONDANSETRON HCL 4 MG/2ML IJ SOLN
4.0000 mg | Freq: Once | INTRAMUSCULAR | Status: AC
  Administered 2024-01-18: 4 mg via INTRAVENOUS
  Filled 2024-01-18: qty 2

## 2024-01-18 MED ORDER — METOCLOPRAMIDE HCL 5 MG/ML IJ SOLN: 5.0000 mg | Freq: Three times a day (TID) | INTRAMUSCULAR | Status: DC | PRN

## 2024-01-18 MED ORDER — PHENOL 1.4 % MT LIQD: 1.0000 | OROMUCOSAL | Status: DC | PRN

## 2024-01-18 MED ORDER — METOPROLOL TARTRATE 50 MG PO TABS
50.0000 mg | ORAL_TABLET | Freq: Two times a day (BID) | ORAL | Status: DC
  Administered 2024-01-18 – 2024-01-19 (×2): 50 mg via ORAL
  Filled 2024-01-18 (×2): qty 1

## 2024-01-18 MED ORDER — CHLORHEXIDINE GLUCONATE 0.12 % MT SOLN
15.0000 mL | Freq: Once | OROMUCOSAL | Status: AC
  Administered 2024-01-18: 15 mL via OROMUCOSAL
  Filled 2024-01-18: qty 15

## 2024-01-18 MED ORDER — CHLORHEXIDINE GLUCONATE 4 % EX SOLN: 1.0000 | CUTANEOUS | 1 refills | Status: DC

## 2024-01-18 MED ORDER — BUPIVACAINE-EPINEPHRINE (PF) 0.5% -1:200000 IJ SOLN
INTRAMUSCULAR | Status: DC | PRN
  Administered 2024-01-18: 15 mL via PERINEURAL

## 2024-01-18 MED ORDER — ACETAMINOPHEN 500 MG PO TABS
1000.0000 mg | ORAL_TABLET | Freq: Four times a day (QID) | ORAL | Status: DC
  Administered 2024-01-18 – 2024-01-19 (×3): 1000 mg via ORAL
  Filled 2024-01-18 (×3): qty 2

## 2024-01-18 MED ORDER — MENTHOL 3 MG MT LOZG: 1.0000 | LOZENGE | OROMUCOSAL | Status: DC | PRN

## 2024-01-18 MED ORDER — ACETAMINOPHEN 10 MG/ML IV SOLN
1000.0000 mg | Freq: Once | INTRAVENOUS | Status: DC | PRN
  Administered 2024-01-18: 1000 mg via INTRAVENOUS

## 2024-01-18 MED ORDER — MORPHINE SULFATE 4 MG/ML IJ SOLN
INTRAMUSCULAR | Status: DC | PRN
  Administered 2024-01-18: 15 mL

## 2024-01-18 MED ORDER — POVIDONE-IODINE 7.5 % EX SOLN: Freq: Once | CUTANEOUS | Status: AC

## 2024-01-18 MED ORDER — IRBESARTAN 300 MG PO TABS
300.0000 mg | ORAL_TABLET | Freq: Every day | ORAL | Status: DC
  Administered 2024-01-19: 300 mg via ORAL
  Filled 2024-01-18: qty 1

## 2024-01-18 MED ORDER — OXYCODONE HCL 5 MG/5ML PO SOLN: 5.0000 mg | Freq: Once | ORAL | Status: DC | PRN

## 2024-01-18 MED ORDER — FENTANYL CITRATE (PF) 100 MCG/2ML IJ SOLN
INTRAMUSCULAR | Status: AC
  Filled 2024-01-18: qty 2

## 2024-01-18 MED ORDER — VITAMIN D 25 MCG (1000 UNIT) PO TABS
10000.0000 [IU] | ORAL_TABLET | Freq: Every morning | ORAL | Status: DC
  Filled 2024-01-18: qty 10

## 2024-01-18 MED ORDER — PHENYLEPHRINE 80 MCG/ML (10ML) SYRINGE FOR IV PUSH (FOR BLOOD PRESSURE SUPPORT)
PREFILLED_SYRINGE | INTRAVENOUS | Status: AC
Start: 2024-01-18 — End: 2024-01-18
  Filled 2024-01-18: qty 10

## 2024-01-18 MED ORDER — ACETAMINOPHEN 325 MG PO TABS: 325.0000 mg | ORAL_TABLET | Freq: Four times a day (QID) | ORAL | Status: DC | PRN

## 2024-01-18 MED ORDER — CLONIDINE HCL (ANALGESIA) 100 MCG/ML EP SOLN
EPIDURAL | Status: AC
  Filled 2024-01-18: qty 10

## 2024-01-18 MED ORDER — 0.9 % SODIUM CHLORIDE (POUR BTL) OPTIME
TOPICAL | Status: DC | PRN
  Administered 2024-01-18: 4000 mL

## 2024-01-18 MED ORDER — INSULIN ASPART 100 UNIT/ML IJ SOLN
0.0000 [IU] | INTRAMUSCULAR | Status: DC | PRN
  Administered 2024-01-18: 4 [IU] via SUBCUTANEOUS
  Filled 2024-01-18: qty 1

## 2024-01-18 MED ORDER — HYDROMORPHONE HCL 1 MG/ML IJ SOLN
0.5000 mg | INTRAMUSCULAR | Status: DC | PRN
  Administered 2024-01-18 – 2024-01-19 (×3): 0.5 mg via INTRAVENOUS
  Filled 2024-01-18 (×3): qty 0.5

## 2024-01-18 MED ORDER — RIVAROXABAN 10 MG PO TABS
10.0000 mg | ORAL_TABLET | Freq: Every day | ORAL | Status: DC
  Administered 2024-01-19: 10 mg via ORAL
  Filled 2024-01-18: qty 1

## 2024-01-18 MED ORDER — MUPIROCIN 2 % EX OINT: 1.0000 | TOPICAL_OINTMENT | Freq: Two times a day (BID) | CUTANEOUS | 0 refills | Status: DC

## 2024-01-18 MED ORDER — OXYCODONE HCL 5 MG PO TABS
5.0000 mg | ORAL_TABLET | ORAL | Status: DC | PRN
  Administered 2024-01-18 – 2024-01-19 (×3): 10 mg via ORAL
  Filled 2024-01-18 (×3): qty 2

## 2024-01-18 MED ORDER — METFORMIN HCL 500 MG PO TABS
500.0000 mg | ORAL_TABLET | Freq: Every day | ORAL | Status: DC
  Administered 2024-01-19: 500 mg via ORAL
  Filled 2024-01-18: qty 1

## 2024-01-18 MED ORDER — ORAL CARE MOUTH RINSE: 15.0000 mL | Freq: Once | OROMUCOSAL | Status: AC

## 2024-01-18 MED ORDER — OXYCODONE HCL 5 MG PO TABS: 5.0000 mg | ORAL_TABLET | Freq: Once | ORAL | Status: DC | PRN

## 2024-01-18 MED ORDER — ONDANSETRON HCL 4 MG PO TABS
4.0000 mg | ORAL_TABLET | Freq: Four times a day (QID) | ORAL | Status: DC | PRN
  Administered 2024-01-19 (×2): 4 mg via ORAL
  Filled 2024-01-18 (×2): qty 1

## 2024-01-18 MED ORDER — POVIDONE-IODINE 10 % EX SWAB
2.0000 | Freq: Once | CUTANEOUS | Status: AC
  Administered 2024-01-18: 2 via TOPICAL

## 2024-01-18 MED ORDER — CEFAZOLIN SODIUM-DEXTROSE 2-4 GM/100ML-% IV SOLN
2.0000 g | Freq: Three times a day (TID) | INTRAVENOUS | Status: AC
Start: 2024-01-18 — End: 2024-01-19
  Administered 2024-01-18 – 2024-01-19 (×2): 2 g via INTRAVENOUS
  Filled 2024-01-18 (×2): qty 100

## 2024-01-18 MED ORDER — TRANEXAMIC ACID 1000 MG/10ML IV SOLN
2000.0000 mg | INTRAVENOUS | Status: DC
  Filled 2024-01-18: qty 20

## 2024-01-18 MED ORDER — ALBUTEROL SULFATE (2.5 MG/3ML) 0.083% IN NEBU: 2.5000 mg | INHALATION_SOLUTION | Freq: Four times a day (QID) | RESPIRATORY_TRACT | Status: DC | PRN

## 2024-01-18 MED ORDER — IRRISEPT - 450ML BOTTLE WITH 0.05% CHG IN STERILE WATER, USP 99.95% OPTIME
TOPICAL | Status: DC | PRN
  Administered 2024-01-18: 450 mL

## 2024-01-18 MED ORDER — AMISULPRIDE (ANTIEMETIC) 5 MG/2ML IV SOLN
10.0000 mg | Freq: Once | INTRAVENOUS | Status: AC
  Administered 2024-01-18: 10 mg via INTRAVENOUS

## 2024-01-18 MED ORDER — LIDOCAINE 2% (20 MG/ML) 5 ML SYRINGE
INTRAMUSCULAR | Status: AC
Start: 2024-01-18 — End: 2024-01-18
  Filled 2024-01-18: qty 5

## 2024-01-18 MED ORDER — BUPIVACAINE HCL (PF) 0.25 % IJ SOLN
INTRAMUSCULAR | Status: AC
  Filled 2024-01-18: qty 30

## 2024-01-18 MED ORDER — SODIUM CHLORIDE (PF) 0.9 % IJ SOLN
INTRAMUSCULAR | Status: DC | PRN
  Administered 2024-01-18: 60 mL

## 2024-01-18 MED ORDER — DEXAMETHASONE SOD PHOSPHATE PF 10 MG/ML IJ SOLN
INTRAMUSCULAR | Status: DC | PRN
  Administered 2024-01-18: 8 mg via INTRAVENOUS

## 2024-01-18 SURGICAL SUPPLY — 67 items
BAG COUNTER SPONGE SURGICOUNT (BAG) ×1 IMPLANT
BAG DECANTER FOR FLEXI CONT (MISCELLANEOUS) ×1 IMPLANT
BLADE SAG 18X100X1.27 (BLADE) ×1 IMPLANT
BLADE SAW SAG 90X13X1.27 (BLADE) ×1 IMPLANT
BNDG COHESIVE 6X5 TAN ST LF (GAUZE/BANDAGES/DRESSINGS) ×1 IMPLANT
BNDG COMPR ESMARK 6X3 LF (GAUZE/BANDAGES/DRESSINGS) ×1 IMPLANT
BNDG ELASTIC 6INX 5YD STR LF (GAUZE/BANDAGES/DRESSINGS) IMPLANT
BNDG ELASTIC 6X15 VLCR STRL LF (GAUZE/BANDAGES/DRESSINGS) ×1 IMPLANT
BOWL SMART MIX CTS (DISPOSABLE) IMPLANT
CNTNR URN SCR LID CUP LEK RST (MISCELLANEOUS) ×1 IMPLANT
COMPONENT FEM CMTS PS 5 61.5RT (Joint) IMPLANT
COMPONENT PATELLA PEG 3 32 (Joint) IMPLANT
COOLER ICEMAN CLASSIC (MISCELLANEOUS) ×1 IMPLANT
COVER SURGICAL LIGHT HANDLE (MISCELLANEOUS) ×1 IMPLANT
CUFF TOURN SGL QUICK 42 (TOURNIQUET CUFF) IMPLANT
CUFF TRNQT CYL 34X4.125X (TOURNIQUET CUFF) IMPLANT
DRAPE INCISE IOBAN 66X45 STRL (DRAPES) IMPLANT
DRAPE SURG ORHT 6 SPLT 77X108 (DRAPES) ×3 IMPLANT
DRAPE U-SHAPE 47X51 STRL (DRAPES) ×1 IMPLANT
DRSG AQUACEL AG ADV 3.5X14 (GAUZE/BANDAGES/DRESSINGS) ×1 IMPLANT
DURAPREP 26ML APPLICATOR (WOUND CARE) ×2 IMPLANT
ELECT CAUTERY BLADE 6.4 (BLADE) ×1 IMPLANT
ELECTRODE REM PT RTRN 9FT ADLT (ELECTROSURGICAL) ×1 IMPLANT
GLOVE BIOGEL PI IND STRL 6.5 (GLOVE) ×1 IMPLANT
GLOVE BIOGEL PI IND STRL 8 (GLOVE) ×1 IMPLANT
GLOVE ECLIPSE 6.5 STRL STRAW (GLOVE) ×2 IMPLANT
GLOVE ECLIPSE 8.0 STRL XLNG CF (GLOVE) ×2 IMPLANT
GOWN STRL REUS W/ TWL XL LVL3 (GOWN DISPOSABLE) ×2 IMPLANT
GOWN TOGA ZIPPER T7+ PEEL AWAY (MISCELLANEOUS) IMPLANT
HOOD PEEL AWAY T7 (MISCELLANEOUS) ×4 IMPLANT
IMMOBILIZER KNEE 20 THIGH 36 (SOFTGOODS) IMPLANT
IMMOBILIZER KNEE 22 UNIV (SOFTGOODS) IMPLANT
IMMOBILIZER KNEE 24 THIGH 36 (SOFTGOODS) IMPLANT
INSERT TIB ASF 11 4-5/CD RT (Insert) IMPLANT
KIT BASIN OR (CUSTOM PROCEDURE TRAY) ×1 IMPLANT
KIT TURNOVER KIT B (KITS) ×1 IMPLANT
MANIFOLD NEPTUNE II (INSTRUMENTS) ×1 IMPLANT
NDL 22X1.5 STRL (OR ONLY) (MISCELLANEOUS) ×2 IMPLANT
NDL SPNL 18GX3.5 QUINCKE PK (NEEDLE) ×1 IMPLANT
NEEDLE 22X1.5 STRL (OR ONLY) (MISCELLANEOUS) ×2 IMPLANT
NEEDLE SPNL 18GX3.5 QUINCKE PK (NEEDLE) ×1 IMPLANT
PACK TOTAL JOINT (CUSTOM PROCEDURE TRAY) ×1 IMPLANT
PAD ARMBOARD POSITIONER FOAM (MISCELLANEOUS) ×2 IMPLANT
PAD COLD SHLDR WRAP-ON (PAD) ×1 IMPLANT
PENCIL BUTTON HOLSTER BLD 10FT (ELECTRODE) ×1 IMPLANT
PIN DRILL HDLS TROCAR 75 4PK (PIN) IMPLANT
SCREW FEMALE HEX FIX 25X2.5 (ORTHOPEDIC DISPOSABLE SUPPLIES) IMPLANT
SET HNDPC FAN SPRY TIP SCT (DISPOSABLE) ×1 IMPLANT
SOLN 0.9% NACL 1000 ML (IV SOLUTION) ×5 IMPLANT
SOLN 0.9% NACL POUR BTL 1000ML (IV SOLUTION) ×5 IMPLANT
SOLN STERILE WATER 1000 ML (IV SOLUTION) ×1 IMPLANT
SOLN STERILE WATER BTL 1000 ML (IV SOLUTION) ×1 IMPLANT
SPIKE FLUID TRANSFER (MISCELLANEOUS) ×2 IMPLANT
STEM TIB PS KNEE D 0D RT (Stem) IMPLANT
STRIP CLOSURE SKIN 1/2X4 (GAUZE/BANDAGES/DRESSINGS) ×2 IMPLANT
SUCTION TUBE FRAZIER 10FR DISP (SUCTIONS) IMPLANT
SUT MNCRL AB 3-0 PS2 18 (SUTURE) ×1 IMPLANT
SUT VIC AB 0 CT1 27XBRD ANBCTR (SUTURE) ×5 IMPLANT
SUT VIC AB 1 CT1 36 (SUTURE) ×6 IMPLANT
SUT VIC AB 2-0 CT2 27 (SUTURE) ×5 IMPLANT
SUT VICRYL 0 27 CT2 27 ABS (SUTURE) ×1 IMPLANT
SUT VICRYL 0 UR6 27IN ABS (SUTURE) IMPLANT
SYR 30ML LL (SYRINGE) ×3 IMPLANT
TOWEL GREEN STERILE (TOWEL DISPOSABLE) ×2 IMPLANT
TOWEL GREEN STERILE FF (TOWEL DISPOSABLE) ×2 IMPLANT
TRAY CATH INTERMITTENT SS 16FR (CATHETERS) IMPLANT
YANKAUER SUCT BULB TIP NO VENT (SUCTIONS) ×1 IMPLANT

## 2024-01-18 NOTE — Transfer of Care (Signed)
 Immediate Anesthesia Transfer of Care Note  Patient: Chelsea Gray  Procedure(s) Performed: RIGHT TOTAL KNEE ARTHROPLASTY (Right: Knee)  Patient Location: PACU  Anesthesia Type:MAC, Regional, and Spinal  Level of Consciousness: awake, alert , and sedated  Airway & Oxygen Therapy: Patient Spontanous Breathing and Patient connected to face mask oxygen  Post-op Assessment: Report given to RN and Post -op Vital signs reviewed and stable  Post vital signs: Reviewed and stable  Last Vitals:  Vitals Value Taken Time  BP 101/77 01/18/24 13:49  Temp 36.3 C 01/18/24 13:49  Pulse 45 01/18/24 14:00  Resp 13 01/18/24 14:00  SpO2 98 % 01/18/24 14:00  Vitals shown include unfiled device data.  Last Pain:  Vitals:   01/18/24 1349  TempSrc:   PainSc: Asleep      Patients Stated Pain Goal: 3 (01/18/24 9047)  Complications: No notable events documented.

## 2024-01-18 NOTE — Progress Notes (Signed)
 Orthopedic Tech Progress Note Patient Details:  Chelsea Gray 04/13/1955 [id number]  CPM Right Knee CPM Right Knee: On Right Knee Flexion (Degrees): 10 Right Knee Extension (Degrees): 40  Post Interventions Patient Tolerated: Well  [name] [name] 01/18/2024, 2:50 PM

## 2024-01-18 NOTE — H&P (Signed)
 TOTAL KNEE ADMISSION H&P  Patient is being admitted for right total knee arthroplasty.  Subjective:  Chief Complaint:right knee pain.  HPI: Chelsea Gray, 68 y.o. female, has a history of pain and functional disability in the right knee due to arthritis and has failed non-surgical conservative treatments for greater than 12 weeks to includeNSAID's and/or analgesics, corticosteriod injections, flexibility and strengthening excercises, and activity modification.  Onset of symptoms was gradual, starting >10 years ago with gradually worsening course since that time. The patient noted prior procedures on the knee to include  arthroscopy and menisectomy on the right knee(s).  Patient currently rates pain in the right knee(s) at 8 out of 10 with activity. Patient has night pain, worsening of pain with activity and weight bearing, pain that interferes with activities of daily living, pain with passive range of motion, crepitus, and joint swelling.  Patient has evidence of subchondral sclerosis and joint space narrowing by imaging studies. This patient has had a good result with left total knee replacement done over 10 years ago.  No  family history of DVT or pulmonary embolism.  But the patient does report having a pulmonary embolism associated with childbirth approximately 31 years ago associated with transfusion.. There is no active infection.  Chelsea Gray is a 68 y.o. female who presents to the office reporting right knee pain.  Patient states I am ready to get it fixed.  She has severe lateral compartment arthritis as well as medial and patellofemoral arthritis.  She has done well with left total knee replacement.  Her pain in the right knee is worsening and throbbing.  Describes constant pain which wakes her from sleep at night.  Also describes giving way.  Swelling stiffness with some mechanical symptoms as well.  She does smoke 1 to 2 cigarettes a day.  Has history of diabetes with recent A1c 7.6.   Uses a compression sleeve which she is moderately helpful along with Mobic  and ice.  She does report having a pulmonary embolism with the birth of her daughter 31 years ago.  Has 3 steps at home.  Husband and daughter do live in the same house.  She does not currently see a cardiologist..    Patient Active Problem List   Diagnosis Date Noted   Hypokalemia 11/01/2023   High anion gap metabolic acidosis 11/01/2023   Pulmonary nodules 11/01/2023   History of COPD 11/01/2023   Hyperthyroidism 11/07/2019   Hyperlipidemia 11/07/2019   Chest pain 10/08/2019   Chest pain with moderate risk for cardiac etiology 10/07/2019   Abdominal pain 12/24/2010   MYALGIA 06/19/2009   NUMBNESS 06/19/2009   DM2 (diabetes mellitus, type 2) (HCC) 01/24/2009   HYPERCHOLESTEROLEMIA 01/24/2009   SMOKER 01/24/2009   DEPRESSION 01/24/2009   Essential hypertension 01/24/2009   CHEST PAIN 01/24/2009   Neuropathy 01/24/2009   Iron deficiency anemia due to chronic blood loss 01/24/2009   Past Medical History:  Diagnosis Date   Anemia    tx w/iron   Blood transfusion    d/t hemmorrhage post c- section   DEPRESSION 01/24/2009   DM 01/24/2009   type 2   H/O blood transfusion reaction 09/1992   cause PE and hemolytic type reaction per pt   History of one miscarriage    HYPERCHOLESTEROLEMIA 01/24/2009   HYPERTENSION 01/24/2009   Hyperthyroidism    per pt on 01/13/24   Osteoarthritis    Pulmonary embolism (HCC)    result of blood transfusion reaction 18 years ago after  childbirth    Past Surgical History:  Procedure Laterality Date   BIOPSY  10/09/2019   Procedure: BIOPSY;  Surgeon: Saintclair Jasper, MD;  Location: [facility] ENDOSCOPY;  Service: Gastroenterology;;   BREAST CYST EXCISION     BREAST REDUCTION SURGERY     CERVICAL CERCLAGE     CESAREAN SECTION     COLONOSCOPY WITH PROPOFOL  N/A 10/09/2019   Procedure: COLONOSCOPY WITH PROPOFOL ;  Surgeon: Saintclair Jasper, MD;  Location: [facility] ENDOSCOPY;  Service:  Gastroenterology;  Laterality: N/A;   ESOPHAGOGASTRODUODENOSCOPY (EGD) WITH PROPOFOL  N/A 10/09/2019   Procedure: ESOPHAGOGASTRODUODENOSCOPY (EGD) WITH PROPOFOL ;  Surgeon: Saintclair Jasper, MD;  Location: [facility] ENDOSCOPY;  Service: Gastroenterology;  Laterality: N/A;   HEMOSTASIS CLIP PLACEMENT  10/09/2019   Procedure: HEMOSTASIS CLIP PLACEMENT;  Surgeon: Saintclair Jasper, MD;  Location: [facility] ENDOSCOPY;  Service: Gastroenterology;;   HOT HEMOSTASIS N/A 10/09/2019   Procedure: HOT HEMOSTASIS (ARGON PLASMA COAGULATION/BICAP);  Surgeon: Saintclair Jasper, MD;  Location: [facility] ENDOSCOPY;  Service: Gastroenterology;  Laterality: N/A;   JOINT REPLACEMENT     KNEE ARTHROPLASTY  07/14/2011   Procedure: COMPUTER ASSISTED TOTAL LEFT  KNEE ARTHROPLASTY;  Surgeon: Cordella Glendia Hutchinson, MD;  Location: [facility] OR;  Service: Orthopedics;  Laterality: Left;  Left total knee arthroplasty   POLYPECTOMY  10/09/2019   Procedure: POLYPECTOMY;  Surgeon: Saintclair Jasper, MD;  Location: [facility] ENDOSCOPY;  Service: Gastroenterology;;   REDUCTION MAMMAPLASTY Bilateral     Current Facility-Administered Medications  Medication Dose Route Frequency Provider Last Rate Last Admin   ceFAZolin  (ANCEF ) IVPB 2g/100 mL premix  2 g Intravenous On Call to OR Magnant, Carlin CROME, PA-C       insulin  aspart (novoLOG ) injection 0-14 Units  0-14 Units Subcutaneous Q2H PRN Leopoldo Bruckner, MD   4 Units at 01/18/24 9060   lactated ringers  infusion   Intravenous Continuous Leopoldo Bruckner, MD 10 mL/hr at 01/18/24 0942 New Bag at 01/18/24 0942   tranexamic acid (CYKLOKAPRON) 2,000 mg in sodium chloride  0.9 % 50 mL Topical Application  2,000 mg Topical To OR Magnant, Charles L, PA-C       Allergies  Allergen Reactions   Codeine Sulfate Nausea And Vomiting and Other (See Comments)    dizziness    Social History   Tobacco Use   Smoking status: Every Day    Current packs/day: 0.10    Average packs/day: 0.1 packs/day for 6.0 years (0.6 ttl pk-yrs)    Types: Cigarettes    Smokeless tobacco: Never  Substance Use Topics   Alcohol use: Yes    Comment: socially beer-wine-liquor    Family History  Problem Relation Age of Onset   Diabetes Mother    Hypertension Mother    Diabetes Father    Arrhythmia Father    Heart disease Father        CABG   Breast cancer Sister    Diabetes Sister    Anesthesia problems Neg Hx      Review of Systems  Musculoskeletal:  Positive for arthralgias.  All other systems reviewed and are negative.   Objective:  Physical Exam Vitals reviewed.  HENT:     Head: Normocephalic.     Nose: Nose normal.     Mouth/Throat:     Mouth: Mucous membranes are moist.  Eyes:     Pupils: Pupils are equal, round, and reactive to light.  Cardiovascular:     Rate and Rhythm: Normal rate.     Pulses: Normal pulses.  Pulmonary:     Effort: Pulmonary effort  is normal.  Abdominal:     General: Abdomen is flat.  Musculoskeletal:     Cervical back: Normal range of motion.  Skin:    General: Skin is warm.     Capillary Refill: Capillary refill takes less than 2 seconds.  Neurological:     General: No focal deficit present.     Mental Status: She is alert.  Psychiatric:        Mood and Affect: Mood normal.   Ortho exam demonstrates range of motion of the right knee of 3 degrees to full flexion. Collaterals are stable. Effusion is present. Extensor mechanism intact. No masses lymphadenopathy or skin changes noted in that right knee region. Patellofemoral crepitus is present. Slight valgus alignment noted. No groin pain with internal/external rotation of the leg. Pedal pulses palpable.  Vital signs in last 24 hours: Temp:  [98 F (36.7 C)] 98 F (36.7 C) (10/14 0857) Pulse Rate:  [60] 60 (10/14 0857) Resp:  [17] 17 (10/14 0857) BP: (168-189)/(60-69) 168/60 (10/14 0913) SpO2:  [97 %] 97 % (10/14 0857) Weight:  [75.3 kg] 75.3 kg (10/14 0857)  Labs:   Estimated body mass index is 27.62 kg/m as calculated from the following:    Height as of this encounter: 5' 5 (1.651 m).   Weight as of this encounter: 75.3 kg.   Imaging Review Plain radiographs demonstrate severe degenerative joint disease of the right knee(s). The overall alignment is mild valgus. The bone quality appears to be good for age and reported activity level.      Assessment/Plan:  End stage arthritis, right knee   The patient history, physical examination, clinical judgment of the provider and imaging studies are consistent with end stage degenerative joint disease of the right knee(s) and total knee arthroplasty is deemed medically necessary. The treatment options including medical management, injection therapy arthroscopy and arthroplasty were discussed at length. The risks and benefits of total knee arthroplasty were presented and reviewed. The risks due to aseptic loosening, infection, stiffness, patella tracking problems, thromboembolic complications and other imponderables were discussed. The patient acknowledged the explanation, agreed to proceed with the plan and consent was signed. Patient is being admitted for inpatient treatment for surgery, pain control, PT, OT, prophylactic antibiotics, VTE prophylaxis, progressive ambulation and ADL's and discharge planning. The patient is planning to be discharged home with home health services     Patient's anticipated LOS is less than 2 midnights, meeting these requirements: - Younger than 47 - Lives within 1 hour of care - Has a competent adult at home to recover with post-op recover - NO history of  - Chronic pain requiring opiods  - Diabetes  - Coronary Artery Disease  - Heart failure  - Heart attack  - Stroke  - DVT/VTE  - Cardiac arrhythmia  - Respiratory Failure/COPD  - Renal failure  - Anemia  - Advanced Liver disease

## 2024-01-18 NOTE — Op Note (Unsigned)
 NAME: Chelsea Gray, Chelsea Gray MEDICAL RECORD NO: [id number] ACCOUNT NO: [id number] DATE OF BIRTH: Jul 04, 1955 FACILITY: [facility] LOCATION: [facility] PHYSICIAN: Cordella RAMAN. Addie, MD  Operative Report   DATE OF PROCEDURE: 01/18/2024  PREOPERATIVE DIAGNOSIS: Right knee arthritis.  POSTOPERATIVE DIAGNOSIS: Right knee arthritis.  PROCEDURE: Right total knee replacement using Persona press-fit 5 cruciate retaining narrow femur, D spiked tibia, 32 mm press-fit patella, tibia also press-fit, 11-mm medial congruent bearing.  SURGEON: Cordella RAMAN. Addie, MD.  ASSISTANT:  Herlene Calix, PA.  INDICATIONS: The patient is a 68 year old patient with end-stage right knee arthritis. Did well with left total knee replacement. Presents for right total knee replacement after explanation of risks and benefits.  DESCRIPTION OF PROCEDURE: The patient was brought to the operating room where spinal anesthetic was induced.  Preoperative antibiotics were administered.  A timeout was called.  The right leg was prescrubbed with alcohol and Betadine and allowed to air  dry, prepped with DuraPrep solution and draped in a sterile manner.  An Ioban was used to cover the operative field. Timeout was called. The leg was elevated and exsanguinated with Esmarch wrap and the tourniquet was inflated. An anterior approach to the  knee was made. Irrisept solution was utilized. Median parapatellar arthrotomy was made and marked with #1 Vicryl suture. Fat pad was excised partially. Medial soft tissue dissection was performed only enough to get the Z retractor in to protect the  medial collateral ligament. We also did some dissection along the lateral tibial plateau with care being taken to avoid injury to surrounding neurovascular structures. The patient had significant patellofemoral arthritis as well as lateral compartment  arthritis. The knee was flexed. The patella was everted. Soft tissue was removed from the anterior distal femur and the  lateral patellofemoral ligament was released. At this time, with the collateral ligaments protected, the PCL was partially released.  Intramedullary alignment was then used to make a cut of 10 mm off the least affected medial tibial plateau. Bone quality was excellent. Collaterals were protected. A generally symmetric cut was made. Next, intramedullary alignment was used to cut the  distal femur in 4 degrees of valgus due to the patient's very mild preop valgus deformity. The initial cut was 10 mm, but it had to be revised to 12 mm in order to get enough lateral femoral condyle to make a meaningful contact surface for the implant.  With those cuts made, the patient had good stability in extension with a 10 and 12-mm spacer. Next, the femur was sized to a size 5. The rotational guide was placed in 3 degrees of external rotation, which gave symmetric flexion and extension gaps.  Anterior, posterior, and chamfer cuts were made. Tibial tray was then placed in line with the medial third of the tibial tubercle. Then the trial components were placed, which started with both a 10, 11, and 12 mm spacer. Patella was then cut down from  21.5 mm down to 11 mm. A 3 peg patella trial was placed. With trial components in position, the patient had full extension, full flexion, excellent stability to varus and valgus stress at 0, 30, and 90 degrees as well as excellent patellar tracking. The  11-mm spacer gave optimal stability, but it was not too tight. At this time, final preparation was made on both the femur and the tibia. Trial components were removed. Irrigation was performed with 3 liters of irrigating solution. Next, the capsule was  anesthetized using Marcaine , Exparel , and saline. We then irrigated the  knee out with 3 liters of pulsatile irrigating solution followed by letting TXA sit in the knee for 3 minutes along with Irrisept solution. These were removed. Irrisept solution was  used to irrigate the tibial canal  and then vancomycin powder placed. The tibial component was placed with very good press-fit obtained. The femoral component was placed. The 11-mm spacer placed and then the patella was placed with very good press-fit  obtained. Same stability parameters were maintained. Full extension, full flexion, no lift-off, and excellent stability to varus and valgus stress at 0, 30, and 90 degrees with excellent patellar tracking using no thumbs technique. Tourniquet was  released at this time. Bleeding points encountered were closed with electrocautery. Thorough irrigation x3 liters was utilized. Then, the arthrotomy was closed over a bolster using #1 Vicryl suture. Prior to final closure, we did irrigate the knee out  thoroughly with Irrisept solution. Then, we placed in vancomycin powder and the arthrotomy was closed. Next, the knee was injected with Marcaine , morphine , and clonidine  and the closure was performed using 0 Vicryl suture, 2-0 Vicryl suture, and 3-0  Monocryl with Steri-Strips. Aquacel dressing was placed along with an Ace wrap. The patient tolerated the procedure well without immediate complications, transferred to the recovery room in stable condition.  Luke's assistance was required at all times for retraction, opening, closing, and mobilization of tissue. His assistance was of medical necessity.   CHR D: 01/18/2024 2:13:37 pm T: 01/18/2024 2:36:00 pm  JOB: [id number]/ [id number]

## 2024-01-18 NOTE — Anesthesia Procedure Notes (Signed)
 Anesthesia Regional Block: Adductor canal block   Pre-Anesthetic Checklist: , timeout performed,  Correct Patient, Correct Site, Correct Laterality,  Correct Procedure, Correct Position, site marked,  Risks and benefits discussed,  Surgical consent,  Pre-op evaluation,  At surgeon's request and post-op pain management  Laterality: Right and Lower  Prep: chloraprep       Needles:  Injection technique: Single-shot      Needle Length: 9cm  Needle Gauge: 22     Additional Needles: Arrow StimuQuik ECHO Echogenic Stimulating PNB Needle  Procedures:,,,, ultrasound used (permanent image in chart),,    Narrative:  Start time: 01/18/2024 10:44 AM End time: 01/18/2024 10:49 AM Injection made incrementally with aspirations every 5 mL.  Performed by: Personally  Anesthesiologist: Leopoldo Bruckner, MD

## 2024-01-18 NOTE — Discharge Instructions (Signed)
 Information on my medicine - XARELTO (Rivaroxaban)  This medication education was reviewed with me or my healthcare representative as part of my discharge preparation.    Why was Xarelto prescribed for you? Xarelto was prescribed for you to reduce the risk of blood clots forming after orthopedic surgery. The medical term for these abnormal blood clots is venous thromboembolism (VTE).  Treatment is usually intended for 2-4 weeks post procedure.  What do you need to know about xarelto ? Take your Xarelto ONCE DAILY at the same time every day.  You may take it either with or without food.  If you have difficulty swallowing the tablet whole, you may crush it and mix in applesauce just prior to taking your dose.  Take Xarelto exactly as prescribed by your doctor and DO NOT stop taking Xarelto without talking to the doctor who prescribed the medication.  Stopping without other VTE prevention medication to take the place of Xarelto may increase your risk of developing a clot.  After discharge, you should have regular check-up appointments with your healthcare provider that is prescribing your Xarelto.    What do you do if you miss a dose? If you miss a dose, take it as soon as you remember on the same day then continue your regularly scheduled once daily regimen the next day. Do not take two doses of Xarelto on the same day.   Important Safety Information A possible side effect of Xarelto is bleeding. You should call your healthcare provider right away if you experience any of the following: Bleeding from an injury or your nose that does not stop. Unusual colored urine (red or dark brown) or unusual colored stools (red or black). Unusual bruising for unknown reasons. A serious fall or if you hit your head (even if there is no bleeding).  Some medicines may interact with Xarelto and might increase your risk of bleeding while on Xarelto. To help avoid this, consult your healthcare  provider or pharmacist prior to using any new prescription or non-prescription medications, including herbals, vitamins, non-steroidal anti-inflammatory drugs (NSAIDs) and supplements.  This website has more information on Xarelto: VisitDestination.com.br.

## 2024-01-18 NOTE — Anesthesia Procedure Notes (Signed)
 Spinal  Patient location during procedure: OR Start time: 01/18/2024 11:07 AM End time: 01/18/2024 11:17 AM Reason for block: surgical anesthesia Staffing Performed: anesthesiologist  Anesthesiologist: Leopoldo Bruckner, MD Performed by: Leopoldo Bruckner, MD Authorized by: Leopoldo Bruckner, MD   Preanesthetic Checklist Completed: patient identified, IV checked, risks and benefits discussed, surgical consent, monitors and equipment checked, pre-op evaluation and timeout performed Spinal Block Patient position: sitting Prep: ChloraPrep Patient monitoring: heart rate, cardiac monitor, continuous pulse ox and blood pressure Approach: midline Location: L4-5 Injection technique: single-shot Needle Needle type: Pencan  Needle gauge: 24 G Needle length: 9 cm Assessment Events: CSF return

## 2024-01-18 NOTE — Brief Op Note (Signed)
   01/18/2024  2:05 PM  PATIENT:  Chelsea Gray  68 y.o. female  PRE-OPERATIVE DIAGNOSIS:  right knee osteoarthritis  POST-OPERATIVE DIAGNOSIS:  right knee osteoarthritis  PROCEDURE:  Procedure(s): RIGHT TOTAL KNEE ARTHROPLASTY  SURGEON:  Surgeon(s): Addie Cordella Hamilton, MD  ASSISTANT: magnant pa  ANESTHESIA:   general  EBL: 25 ml    Total I/O In: 982 [I.V.:882; IV Piggyback:100] Out: 440 [Urine:400; Blood:40]  BLOOD ADMINISTERED: none  DRAINS: none   LOCAL MEDICATIONS USED: Marcaine  morphine  clonidine  Exparel  vancomycin powder  SPECIMEN:  No Specimen  COUNTS:  YES  TOURNIQUET:   Total Tourniquet Time Documented: Thigh (Right) - 77 minutes Total: Thigh (Right) - 77 minutes   DICTATION: .Other Dictation: Dictation [id number]  PLAN OF CARE: Admit for overnight observation  PATIENT DISPOSITION:  PACU - hemodynamically stable

## 2024-01-18 NOTE — Evaluation (Signed)
 Physical Therapy Evaluation Patient Details Name: Chelsea Gray MRN: [id number] DOB: 1955-06-09 Today's Date: 01/18/2024  History of Present Illness  Pt is 68 yo presenting to [facility] on 10/14 for schedule R TKA. PMH: Anemia, depression, DM II, hypercholesterolemia, HTN, hyperhyroidism, OA, PE.  Clinical Impression  Pt is presenting at Mod I to CGA for all functional activities including bed mobility, sit to stand and gait. Deferred gait today due to pt stating she feels fatigued and slightly dizzy from medication. Spouse is able to assist at home. Recommending to follow physician recommendation for physical therapy once discharged from acute care hospital setting.   Pt will benefit from continued skilled physical therapy services in acute care hospital setting at this time.            If plan is discharge home, recommend the following: Help with stairs or ramp for entrance;Assist for transportation;Assistance with cooking/housework     Equipment Recommendations Rolling walker (2 wheels)     Functional Status Assessment Patient has had a recent decline in their functional status and demonstrates the ability to make significant improvements in function in a reasonable and predictable amount of time.     Precautions / Restrictions Precautions Precautions: Fall;Knee Precaution Booklet Issued: Yes (comment) Recall of Precautions/Restrictions: Intact Restrictions Weight Bearing Restrictions Per Provider Order: No      Mobility  Bed Mobility Overal bed mobility: Modified Independent     General bed mobility comments: HOB slightly elevated minimal use of bed rails.    Transfers Overall transfer level: Needs assistance Equipment used: Rolling walker (2 wheels) Transfers: Sit to/from Stand Sit to Stand: Contact guard assist           General transfer comment: verbal cues for safe hand placement, CGA for safety    Ambulation/Gait Ambulation/Gait assistance: Contact guard assist,  Supervision Gait Distance (Feet): 150 Feet Assistive device: Rolling walker (2 wheels) Gait Pattern/deviations: Step-through pattern, Decreased stance time - left Gait velocity: decreased Gait velocity interpretation: 1.31 - 2.62 ft/sec, indicative of limited community ambulator   General Gait Details: very mild decrease in stance time on the L; no antalgic gait noted. pt with minimal UE support on RW. CGA to supervision for safety     Balance Overall balance assessment: No apparent balance deficits (not formally assessed)           Pertinent Vitals/Pain Pain Assessment Pain Assessment: Faces Faces Pain Scale: No hurt Pain Intervention(s): Monitored during session    Home Living Family/patient expects to be discharged to:: Private residence Living Arrangements: Spouse/significant other Available Help at Discharge: Family;Available 24 hours/day Type of Home: House Home Access: Stairs to enter Entrance Stairs-Rails: Left Entrance Stairs-Number of Steps: 3-4 depending on if she does car port or front. Car port has no rails, Front has 4 stairs and rail   Home Layout: One level Home Equipment: BSC/3in1;Crutches      Prior Function Prior Level of Function : Independent/Modified Independent;Driving;Working/employed             Mobility Comments: Ind with mobility, denies falls. ADLs Comments: works part time, ind with ADL's and IADLs.     Extremity/Trunk Assessment   Upper Extremity Assessment Upper Extremity Assessment: Overall WFL for tasks assessed    Lower Extremity Assessment Lower Extremity Assessment: Overall WFL for tasks assessed;RLE deficits/detail RLE Deficits / Details: recent TKA    Cervical / Trunk Assessment Cervical / Trunk Assessment: Normal  Communication   Communication Communication: No apparent difficulties    Cognition  Arousal: Alert Behavior During Therapy: WFL for tasks assessed/performed   PT - Cognitive impairments: No apparent  impairments     Following commands: Intact       Cueing Cueing Techniques: Verbal cues     General Comments General comments (skin integrity, edema, etc.): Pt RLE wrapped no drainage noted. No signs/symptoms of cardiac/respiratory distress.    Exercises Total Joint Exercises Goniometric ROM: 95 degrees R knee flexion and (-10) degrees R knee extension at this time with compression wrap in place.   Assessment/Plan    PT Assessment Patient needs continued PT services  PT Problem List Decreased mobility;Decreased balance;Decreased safety awareness       PT Treatment Interventions DME instruction;Balance training;Gait training;Stair training;Functional mobility training;Therapeutic activities;Therapeutic exercise;Patient/family education    PT Goals (Current goals can be found in the Care Plan section)  Acute Rehab PT Goals Patient Stated Goal: to return home and get back to moving. PT Goal Formulation: With patient Time For Goal Achievement: 02/01/24 Potential to Achieve Goals: Good    Frequency 7X/week        AM-PAC PT 6 Clicks Mobility  Outcome Measure Help needed turning from your back to your side while in a flat bed without using bedrails?: None Help needed moving from lying on your back to sitting on the side of a flat bed without using bedrails?: None Help needed moving to and from a bed to a chair (including a wheelchair)?: A Little Help needed standing up from a chair using your arms (e.g., wheelchair or bedside chair)?: A Little Help needed to walk in hospital room?: A Little Help needed climbing 3-5 steps with a railing? : A Little 6 Click Score: 20    End of Session Equipment Utilized During Treatment: Gait belt Activity Tolerance: Patient tolerated treatment well Patient left: in bed;with call bell/phone within reach;with bed alarm set Nurse Communication: Mobility status PT Visit Diagnosis: Other abnormalities of gait and mobility (R26.89)    Time:  8341-8278 PT Time Calculation (min) (ACUTE ONLY): 23 min   Charges:   PT Evaluation $PT Eval Low Complexity: 1 Low PT Treatments $Therapeutic Activity: 8-22 mins PT General Charges $$ ACUTE PT VISIT: 1 Visit        Dorothyann Maier, DPT, CLT  Acute Rehabilitation Services Office: [phone] (Secure chat preferred)   Dorothyann VEAR Maier 01/18/2024, 5:41 PM

## 2024-01-19 ENCOUNTER — Encounter (HOSPITAL_COMMUNITY): Payer: Self-pay | Admitting: Orthopedic Surgery

## 2024-01-19 ENCOUNTER — Other Ambulatory Visit (HOSPITAL_COMMUNITY): Payer: Self-pay

## 2024-01-19 ENCOUNTER — Telehealth (HOSPITAL_COMMUNITY): Payer: Self-pay | Admitting: Pharmacy Technician

## 2024-01-19 DIAGNOSIS — M1711 Unilateral primary osteoarthritis, right knee: Secondary | ICD-10-CM | POA: Diagnosis not present

## 2024-01-19 MED ORDER — RIVAROXABAN 10 MG PO TABS
10.0000 mg | ORAL_TABLET | Freq: Every day | ORAL | 0 refills | Status: AC
  Filled 2024-01-19: qty 15, 15d supply, fill #0

## 2024-01-19 MED ORDER — METHOCARBAMOL 500 MG PO TABS
500.0000 mg | ORAL_TABLET | Freq: Three times a day (TID) | ORAL | 0 refills | Status: DC | PRN
  Filled 2024-01-19: qty 30, 10d supply, fill #0

## 2024-01-19 MED ORDER — DOCUSATE SODIUM 100 MG PO CAPS
100.0000 mg | ORAL_CAPSULE | Freq: Two times a day (BID) | ORAL | 0 refills | Status: AC
  Filled 2024-01-19: qty 10, 5d supply, fill #0

## 2024-01-19 MED ORDER — OXYCODONE HCL 5 MG PO TABS
5.0000 mg | ORAL_TABLET | ORAL | 0 refills | Status: DC | PRN
  Filled 2024-01-19: qty 30, 5d supply, fill #0

## 2024-01-19 MED ORDER — CHLORHEXIDINE GLUCONATE 4 % EX SOLN
1.0000 | CUTANEOUS | 1 refills | Status: AC
  Filled 2024-01-19: qty 946, 30d supply, fill #0

## 2024-01-19 MED ORDER — MUPIROCIN 2 % EX OINT
1.0000 | TOPICAL_OINTMENT | Freq: Two times a day (BID) | CUTANEOUS | 0 refills | Status: AC
  Filled 2024-01-19: qty 60, 30d supply, fill #0

## 2024-01-19 NOTE — Progress Notes (Signed)
 Physical Therapy Treatment Patient Details Name: Chelsea Gray MRN: [id number] DOB: 11/21/1955 Today's Date: 01/19/2024   History of Present Illness Pt is 68 yo presenting to [facility] on 10/14 for schedule R TKA. PMH: Anemia, depression, DM II, hypercholesterolemia, HTN, hyperhyroidism, OA, PE.    PT Comments  Pt resting in bed on arrival, pleasant and agreeable to session and demonstrating good progress towards acute goals. Pt demonstrating bed mobility, transfers and gait with RW for support with grossly supervision for safety. Pt able to ascend/descend x5 steps with and without rail support to simulate home entry. Pt performing seated and supine LE exercises for increased ROM and strength. Pt was educated on continued walker use to maximize functional independence, safety, and decrease risk for falls as well as HEP and compliance, ice, appropriate activity progression, safe car entry/exit and importance of continued mobility with pt verbalizing understanding. Anticipate safe discharge, with assist level outlined below, once medically cleared, will continue to follow acutely.      If plan is discharge home, recommend the following: Help with stairs or ramp for entrance;Assist for transportation;Assistance with cooking/housework   Can travel by private vehicle        Equipment Recommendations  Rolling walker (2 wheels)    Recommendations for Other Services       Precautions / Restrictions Precautions Precautions: Fall;Knee Precaution Booklet Issued: Yes (comment) Recall of Precautions/Restrictions: Intact Restrictions Weight Bearing Restrictions Per Provider Order: No RLE Weight Bearing Per Provider Order: Weight bearing as tolerated     Mobility  Bed Mobility Overal bed mobility: Modified Independent             General bed mobility comments: HOB slightly elevated minimal use of bed rails.    Transfers Overall transfer level: Needs assistance Equipment used: Rolling  walker (2 wheels) Transfers: Sit to/from Stand Sit to Stand: Supervision           General transfer comment: verbal cues for hand placement    Ambulation/Gait Ambulation/Gait assistance: Supervision Gait Distance (Feet): 250 Feet Assistive device: Rolling walker (2 wheels) Gait Pattern/deviations: Step-through pattern, Decreased stance time - left Gait velocity: decreased     General Gait Details: very mild decrease in stance time on the L; no antalgic gait noted. pt with minimal UE support on RW. supervision for safety   Stairs Stairs: Yes Stairs assistance: Contact guard assist Stair Management: No rails, One rail Left, Step to pattern, Forwards Number of Stairs: 5 General stair comments: close CGA for safety, able to ascend without rail support to simulate home entry, pt then utilizing unilateral rail support for x2 steps. cues for LE sequencing   Wheelchair Mobility     Tilt Bed    Modified Rankin (Stroke Patients Only)       Balance Overall balance assessment: No apparent balance deficits (not formally assessed)                                          Communication Communication Communication: No apparent difficulties  Cognition Arousal: Alert Behavior During Therapy: WFL for tasks assessed/performed   PT - Cognitive impairments: No apparent impairments                         Following commands: Intact      Cueing Cueing Techniques: Verbal cues  Exercises Total Joint Exercises Quad Sets: Right, 5  reps Heel Slides: Right, 5 reps Hip ABduction/ADduction: Right, 5 reps Straight Leg Raises: Right, 5 reps Long Arc Quad: Right, 5 reps Knee Flexion: Right, 5 reps Goniometric ROM: 0-110    General Comments        Pertinent Vitals/Pain Pain Assessment Pain Assessment: Faces Faces Pain Scale: Hurts a little bit Pain Location: R knee with flexion Pain Descriptors / Indicators: Grimacing, Guarding Pain  Intervention(s): Monitored during session, Limited activity within patient's tolerance    Home Living                          Prior Function            PT Goals (current goals can now be found in the care plan section) Acute Rehab PT Goals Patient Stated Goal: to return home and get back to moving. PT Goal Formulation: With patient Time For Goal Achievement: 02/01/24 Progress towards PT goals: Progressing toward goals    Frequency    7X/week      PT Plan      Co-evaluation              AM-PAC PT 6 Clicks Mobility   Outcome Measure  Help needed turning from your back to your side while in a flat bed without using bedrails?: None Help needed moving from lying on your back to sitting on the side of a flat bed without using bedrails?: None Help needed moving to and from a bed to a chair (including a wheelchair)?: A Little Help needed standing up from a chair using your arms (e.g., wheelchair or bedside chair)?: A Little Help needed to walk in hospital room?: A Little Help needed climbing 3-5 steps with a railing? : A Little 6 Click Score: 20    End of Session Equipment Utilized During Treatment: Right knee immobilizer Activity Tolerance: Patient tolerated treatment well Patient left: in bed;with call bell/phone within reach Nurse Communication: Mobility status;Other (comment) (pt without need for PM session) PT Visit Diagnosis: Other abnormalities of gait and mobility (R26.89)     Time: 9146-9084 PT Time Calculation (min) (ACUTE ONLY): 22 min  Charges:    $Gait Training: 8-22 mins PT General Charges $$ ACUTE PT VISIT: 1 Visit                     Chelsea Gray R. PTA Acute Rehabilitation Services Office: [phone]   Chelsea Gray 01/19/2024, 9:31 AM

## 2024-01-19 NOTE — TOC Transition Note (Incomplete)
 Transition of Care [facility]) - Discharge Note   Patient Details  Name: Chelsea Gray MRN: [id number] Date of Birth: 1955-11-21  Transition of Care [facility]) CM/SW Contact:  Rosalva Jon Bloch, RN Phone Number: 01/19/2024, 1:51 PM   Clinical Narrative:       Final next level of care: Home w Home Health Services Barriers to Discharge: No Barriers Identified   Patient Goals and CMS Choice            Discharge Placement                       Discharge Plan and Services Additional resources added to the After Visit Summary for                  DME Arranged: Walker rolling DME Agency: [employer] Date DME Agency Contacted: 01/19/24 Time DME Agency Contacted: 1025 Representative spoke with at DME Agency: [place] HH Arranged: PT HH Agency: [facility] Date HH Agency Contacted: 01/19/24 Time HH Agency Contacted: 1026 Representative spoke with at [facility] Agency: [place]  Social Drivers of Health (SDOH) Interventions SDOH Screenings   Food Insecurity: No Food Insecurity (01/18/2024)  Housing: Low Risk  (01/18/2024)  Transportation Needs: No Transportation Needs (01/18/2024)  Utilities: Not At Risk (01/18/2024)  Social Connections: Socially Integrated (01/18/2024)  Tobacco Use: High Risk (01/13/2024)     Readmission Risk Interventions     No data to display

## 2024-01-19 NOTE — Telephone Encounter (Signed)
 Patient Product/process development scientist completed.    The patient is insured through [facility]. Patient has [employer], may use a copay card, and/or apply for patient assistance if available.    Ran test claim for Xarelto 10 mg and the current 30 day co-pay is $173.99.   This test claim was processed through  [facility]- copay amounts may vary at other pharmacies due to pharmacy/plan contracts, or as the patient moves through the different stages of their insurance plan.     Reyes Sharps, CPHT Pharmacy Technician Patient Advocate Specialist Lead [facility] Patient Advocate Team Direct Number: [phone]  Fax: [phone]

## 2024-01-19 NOTE — Care Management Obs Status (Signed)
 MEDICARE OBSERVATION STATUS NOTIFICATION   Patient Details  Name: SHERRIA RIEMANN MRN: [id number] Date of Birth: 27-Jul-1955   Medicare Observation Status Notification Given:  Yes    Bridget Cordella Simmonds, LCSW 01/19/2024, 2:15 PM

## 2024-01-19 NOTE — Progress Notes (Signed)
  Subjective: Chelsea Gray is a 68 y.o. female s/p right TKA.  They are POD 1.  Pt's pain is controlled.  Pt denies any complain of chest pain, shortness of breath, abdominal pain, calf pain.  Patient denies any fevers or chills.  Was able to ambulate well with physical therapy yesterday.  Did not sleep much last night.  Objective: Vital signs in last 24 hours: Temp:  [97.4 F (36.3 C)-98.7 F (37.1 C)] 98.7 F (37.1 C) (10/15 0600) Pulse Rate:  [42-60] 51 (10/14 2354) Resp:  [9-17] 17 (10/15 0600) BP: (101-190)/(54-87) 143/75 (10/15 0600) SpO2:  [97 %-100 %] 98 % (10/15 0600) Weight:  [75.3 kg] 75.3 kg (10/14 0857)  Intake/Output from previous day: 10/14 0701 - 10/15 0700 In: 1462 [P.O.:480; I.V.:882; IV Piggyback:100] Out: 440 [Urine:400; Blood:40] Intake/Output this shift: No intake/output data recorded.  Exam:  No gross blood or drainage overlying the dressing 2+ DP pulse Sensation intact distally in the operative foot Able to dorsiflex and plantarflex the operative foot No calf tenderness.  Negative Homans' sign. Able to perform straight leg raise and maintain straight leg raise for at least 10 seconds without extensor lag   Labs: No results for input(s): HGB in the last 72 hours. No results for input(s): WBC, RBC, HCT, PLT in the last 72 hours. No results for input(s): NA, K, CL, CO2, BUN, CREATININE, GLUCOSE, CALCIUM  in the last 72 hours. No results for input(s): LABPT, INR in the last 72 hours.  Assessment/Plan: Pt is POD 1 s/p TKA.    -Plan to discharge to home today or tomorrow pending patient's pain and PT eval  - Xarelto for DVT prophylaxis to start at 10 AM this morning  -WBAT with a walker  -Follow-up with Dr. Addie in clinic 2 weeks postoperatively    [facility] 01/19/2024, 7:47 AM

## 2024-01-20 ENCOUNTER — Encounter (HOSPITAL_COMMUNITY): Payer: Self-pay | Admitting: Orthopedic Surgery

## 2024-01-20 NOTE — Anesthesia Postprocedure Evaluation (Signed)
 Anesthesia Post Note  Patient: Chelsea Gray  Procedure(s) Performed: RIGHT TOTAL KNEE ARTHROPLASTY (Right: Knee)     Patient location during evaluation: PACU Anesthesia Type: Regional, MAC and Spinal Level of consciousness: awake and alert Pain management: pain level controlled Vital Signs Assessment: post-procedure vital signs reviewed and stable Respiratory status: spontaneous breathing, nonlabored ventilation and respiratory function stable Cardiovascular status: stable and blood pressure returned to baseline Postop Assessment: no apparent nausea or vomiting Anesthetic complications: no   No notable events documented.    Zadyn Yardley

## 2024-01-21 ENCOUNTER — Encounter: Payer: Self-pay | Admitting: Orthopedic Surgery

## 2024-01-23 NOTE — Discharge Summary (Signed)
 Physician Discharge Summary      Patient ID: Chelsea Gray MRN: [id number] DOB/AGE: [birth year] 68 y.o.  Admit date: 01/18/2024 Discharge date: 01/19/2024  Admission Diagnoses:  Principal Problem:   S/P total knee replacement, right   Discharge Diagnoses:  Same  Surgeries: Procedure(s): RIGHT TOTAL KNEE ARTHROPLASTY on 01/18/2024   Consultants:   Discharged Condition: Stable  Hospital Course: Chelsea Gray is an 68 y.o. female who was admitted 01/18/2024 with a chief complaint of right knee pain, and found to have a diagnosis of right knee arthritis.  They were brought to the operating room on 01/18/2024 and underwent the above named procedures.  Pt awoke from anesthesia without complication and was transferred to the floor. On POD1, patient's pain was overall controlled.  She was doing very well in terms of her quadricep strength and ability to mobilize with physical therapy.  She had no red flag signs or symptoms throughout her stay.  Discharged home on POD 1..  Pt will f/u with Dr. Addie in clinic in ~2 weeks.   Antibiotics given:  Anti-infectives (From admission, onward)    Start     Dose/Rate Route Frequency Ordered Stop   01/18/24 2000  ceFAZolin  (ANCEF ) IVPB 2g/100 mL premix        2 g 200 mL/hr over 30 Minutes Intravenous Every 8 hours 01/18/24 1623 01/19/24 1422   01/18/24 1059  vancomycin (VANCOCIN) powder  Status:  Discontinued          As needed 01/18/24 1100 01/18/24 1343   01/18/24 0915  ceFAZolin  (ANCEF ) IVPB 2g/100 mL premix        2 g 200 mL/hr over 30 Minutes Intravenous On call to O.R. 01/18/24 9094 01/18/24 1124     .  Recent vital signs:  Vitals:   01/19/24 0600 01/19/24 1400  BP: (!) 143/75 (!) 142/81  Pulse:  (!) 50  Resp: 17 17  Temp: 98.7 F (37.1 C) 97.9 F (36.6 C)  SpO2: 98% 99%    Recent laboratory studies:  Results for orders placed or performed during the hospital encounter of 01/18/24  Glucose, capillary   Collection Time:  01/18/24  9:02 AM  Result Value Ref Range   Glucose-Capillary 204 (H) 70 - 99 mg/dL  Glucose, capillary   Collection Time: 01/18/24 11:58 AM  Result Value Ref Range   Glucose-Capillary 191 (H) 70 - 99 mg/dL  Glucose, capillary   Collection Time: 01/18/24  2:12 PM  Result Value Ref Range   Glucose-Capillary 182 (H) 70 - 99 mg/dL    Discharge Medications:   Allergies as of 01/19/2024       Reactions   Codeine Sulfate Nausea And Vomiting, Other (See Comments)   dizziness        Medication List     STOP taking these medications    aspirin  81 MG chewable tablet       TAKE these medications    acetaminophen  500 MG tablet Commonly known as: TYLENOL  Take 1,000 mg by mouth every 6 (six) hours as needed for mild pain (pain score 1-3) or headache.   albuterol  108 (90 Base) MCG/ACT inhaler Commonly known as: VENTOLIN  HFA Inhale 2 puffs into the lungs every 6 (six) hours as needed for wheezing or shortness of breath.   amoxicillin  500 MG capsule Commonly known as: AMOXIL  Take 4 capsules (2000 mg total) by mouth one hour prior to dental procedure.   atorvastatin  40 MG tablet Commonly known as: LIPITOR  Take 40 mg by  mouth daily.   chlorhexidine  4 % external liquid Commonly known as: HIBICLENS  Apply 15 mLs (1 Application total) topically as directed for 30 doses. Use as directed daily for 5 days every other week for 6 weeks.   Cholecalciferol 250 MCG (10000 UT) Caps Take 10,000 Units by mouth in the morning.   docusate sodium 100 MG capsule Commonly known as: COLACE Take 1 capsule (100 mg total) by mouth 2 (two) times daily.   ferrous sulfate  325 (65 FE) MG tablet Take 1 tablet (325 mg total) by mouth daily.   gabapentin  100 MG capsule Commonly known as: NEURONTIN  Take 100 mg by mouth daily as needed (Knee pain).   Jardiance 10 MG Tabs tablet Generic drug: empagliflozin TAKE 1 TABLET BY MOUTH EVERY DAY FOR 30 DAYS   Lantus  SoloStar 100 UNIT/ML Solostar  Pen Generic drug: insulin  glargine Inject 8 Units into the skin in the morning.   metFORMIN  500 MG tablet Commonly known as: GLUCOPHAGE  Take 1 tablet (500 mg total) by mouth 2 (two) times daily with a meal. What changed: when to take this   methimazole  10 MG tablet Commonly known as: TAPAZOLE  Take 2 tablets (20 mg total) by mouth daily.   methocarbamol  500 MG tablet Commonly known as: ROBAXIN  Take 1 tablet (500 mg total) by mouth every 8 (eight) hours as needed for muscle spasms.   metoprolol  tartrate 50 MG tablet Commonly known as: LOPRESSOR  Take 50 mg by mouth 2 (two) times daily.   mupirocin ointment 2 % Commonly known as: BACTROBAN Place 1 Application into the nose 2 (two) times daily for 60 doses. Use as directed 2 times daily for 5 days every other week for 6 weeks.   nitroGLYCERIN  0.4 MG SL tablet Commonly known as: Nitrostat  Place 1 tablet (0.4 mg total) under the tongue every 5 (five) minutes as needed for chest pain.   olmesartan  40 MG tablet Commonly known as: BENICAR  Take 40 mg by mouth daily.   ondansetron  4 MG disintegrating tablet Commonly known as: ZOFRAN -ODT Take 4 mg by mouth every 8 (eight) hours as needed for vomiting or nausea.   oxyCODONE  5 MG immediate release tablet Commonly known as: Oxy IR/ROXICODONE  Take 1-2 tablets (5-10 mg total) by mouth every 4 (four) hours as needed for moderate pain (pain score 4-6) (pain score 4-6). Do not exceed 6 tablets per day   pantoprazole  40 MG tablet Commonly known as: PROTONIX  Take 1 tablet (40 mg total) by mouth daily.   potassium chloride  SA 20 MEQ tablet Commonly known as: KLOR-CON  M Take 1 tablet (20 mEq total) by mouth daily for 3 days.   timolol 0.5 % ophthalmic solution Commonly known as: TIMOPTIC Place 1 drop into both eyes daily.   Xarelto 10 MG Tabs tablet Generic drug: rivaroxaban Take 1 tablet (10 mg total) by mouth daily.   zolpidem  10 MG tablet Commonly known as: AMBIEN  Take 10 mg by  mouth at bedtime as needed for sleep.        Diagnostic Studies: XR Knee 1-2 Views Right Result Date: 12/31/2023 AP lateral radiographs right knee reviewed.  Mild valgus alignment.  End-stage arthritis present in the lateral compartment.  No acute fracture   Disposition: Discharge disposition: 01-Home or Self Care       Discharge Instructions     Call MD / Call 911   Complete by: As directed    If you experience chest pain or shortness of breath, CALL 911 and be transported to the hospital  emergency room.  If you develope a fever above 101 F, pus (white drainage) or increased drainage or redness at the wound, or calf pain, call your surgeon's office.   Constipation Prevention   Complete by: As directed    Drink plenty of fluids.  Prune juice may be helpful.  You may use a stool softener, such as Colace (over the counter) 100 mg twice a day.  Use MiraLax  (over the counter) for constipation as needed.   Diet - low sodium heart healthy   Complete by: As directed    Discharge instructions   Complete by: As directed    Take Xarelto to prevent blood clot formation.  You may shower, dressing is waterproof.  Do not remove the dressing, we will remove it at your first post-op appointment.  Do not take a bath or soak the knee in a tub or pool.  You may weightbear as you can tolerate on the operative leg with a walker.  Continue using the CPM machine 3 times per day for one hour each time, increasing the degrees of range of motion daily.  Use the blue cradle boot under your heel to work on getting your leg straight.  Do NOT put a pillow under your knee.  You will follow-up with Dr. Addie in the clinic in 2 weeks at your given appointment date.    INSTRUCTIONS AFTER JOINT REPLACEMENT   Remove items at home which could result in a fall. This includes throw rugs or furniture in walking pathways ICE to the affected joint every three hours while awake for 30 minutes at a time, for at least the first  3-5 days, and then as needed for pain and swelling.  Continue to use ice for pain and swelling. You may notice swelling that will progress down to the foot and ankle.  This is normal after surgery.  Elevate your leg when you are not up walking on it.   Continue to use the breathing machine you got in the hospital (incentive spirometer) which will help keep your temperature down.  It is common for your temperature to cycle up and down following surgery, especially at night when you are not up moving around and exerting yourself.  The breathing machine keeps your lungs expanded and your temperature down.   DIET:  As you were doing prior to hospitalization, we recommend a well-balanced diet.  DRESSING / WOUND CARE / SHOWERING  Keep the surgical dressing until follow up.  The dressing is water proof, so you can shower without any extra covering.  IF THE DRESSING FALLS OFF or the wound gets wet inside, change the dressing with sterile gauze.  Please use good hand washing techniques before changing the dressing.  Do not use any lotions or creams on the incision until instructed by your surgeon.    ACTIVITY  Increase activity slowly as tolerated, but follow the weight bearing instructions below.   No driving for 6 weeks or until further direction given by your physician.  You cannot drive while taking narcotics.  No lifting or carrying greater than 10 lbs. until further directed by your surgeon. Avoid periods of inactivity such as sitting longer than an hour when not asleep. This helps prevent blood clots.  You may return to work once you are authorized by your doctor.     WEIGHT BEARING   Weight bearing as tolerated with assist device (walker, cane, etc) as directed, use it as long as suggested by your surgeon or  therapist, typically at least 4-6 weeks.   EXERCISES  Results after joint replacement surgery are often greatly improved when you follow the exercise, range of motion and muscle  strengthening exercises prescribed by your doctor. Safety measures are also important to protect the joint from further injury. Any time any of these exercises cause you to have increased pain or swelling, decrease what you are doing until you are comfortable again and then slowly increase them. If you have problems or questions, call your caregiver or physical therapist for advice.   Rehabilitation is important following a joint replacement. After just a few days of immobilization, the muscles of the leg can become weakened and shrink (atrophy).  These exercises are designed to build up the tone and strength of the thigh and leg muscles and to improve motion. Often times heat used for twenty to thirty minutes before working out will loosen up your tissues and help with improving the range of motion but do not use heat for the first two weeks following surgery (sometimes heat can increase post-operative swelling).   These exercises can be done on a training (exercise) mat, on the floor, on a table or on a bed. Use whatever works the best and is most comfortable for you.    Use music or television while you are exercising so that the exercises are a pleasant break in your day. This will make your life better with the exercises acting as a break in your routine that you can look forward to.   Perform all exercises about fifteen times, three times per day or as directed.  You should exercise both the operative leg and the other leg as well.  Exercises include:   Quad Sets - Tighten up the muscle on the front of the thigh (Quad) and hold for 5-10 seconds.   Straight Leg Raises - With your knee straight (if you were given a brace, keep it on), lift the leg to 60 degrees, hold for 3 seconds, and slowly lower the leg.  Perform this exercise against resistance later as your leg gets stronger.  Leg Slides: Lying on your back, slowly slide your foot toward your buttocks, bending your knee up off the floor (only go  as far as is comfortable). Then slowly slide your foot back down until your leg is flat on the floor again.  Angel Wings: Lying on your back spread your legs to the side as far apart as you can without causing discomfort.  Hamstring Strength:  Lying on your back, push your heel against the floor with your leg straight by tightening up the muscles of your buttocks.  Repeat, but this time bend your knee to a comfortable angle, and push your heel against the floor.  You may put a pillow under the heel to make it more comfortable if necessary.   A rehabilitation program following joint replacement surgery can speed recovery and prevent re-injury in the future due to weakened muscles. Contact your doctor or a physical therapist for more information on knee rehabilitation.    CONSTIPATION  Constipation is defined medically as fewer than three stools per week and severe constipation as less than one stool per week.  Even if you have a regular bowel pattern at home, your normal regimen is likely to be disrupted due to multiple reasons following surgery.  Combination of anesthesia, postoperative narcotics, change in appetite and fluid intake all can affect your bowels.   YOU MUST use at least one of the  following options; they are listed in order of increasing strength to get the job done.  They are all available over the counter, and you may need to use some, POSSIBLY even all of these options:    Drink plenty of fluids (prune juice may be helpful) and high fiber foods Colace 100 mg by mouth twice a day  Senokot for constipation as directed and as needed Dulcolax (bisacodyl ), take with full glass of water  Miralax  (polyethylene glycol) once or twice a day as needed.  If you have tried all these things and are unable to have a bowel movement in the first 3-4 days after surgery call either your surgeon or your primary doctor.    If you experience loose stools or diarrhea, hold the medications until you  stool forms back up.  If your symptoms do not get better within 1 week or if they get worse, check with your doctor.  If you experience the worst abdominal pain ever or develop nausea or vomiting, please contact the office immediately for further recommendations for treatment.   ITCHING:  If you experience itching with your medications, try taking only a single pain pill, or even half a pain pill at a time.  You can also use Benadryl  over the counter for itching or also to help with sleep.   TED HOSE STOCKINGS:  Use stockings on both legs until for at least 2 weeks or as directed by physician office. They may be removed at night for sleeping.  MEDICATIONS:  See your medication summary on the After Visit Summary that nursing will review with you.  You may have some home medications which will be placed on hold until you complete the course of blood thinner medication.  It is important for you to complete the blood thinner medication as prescribed.  PRECAUTIONS:  If you experience chest pain or shortness of breath - call 911 immediately for transfer to the hospital emergency department.   If you develop a fever greater that 101 F, purulent drainage from wound, increased redness or drainage from wound, foul odor from the wound/dressing, or calf pain - CONTACT YOUR SURGEON.                                                   FOLLOW-UP APPOINTMENTS:  If you do not already have a post-op appointment, please call the office for an appointment to be seen by your surgeon.  Guidelines for how soon to be seen are listed in your After Visit Summary, but are typically between 1-4 weeks after surgery.  OTHER INSTRUCTIONS:   Knee Replacement:  Do not place pillow under knee, focus on keeping the knee straight while resting. CPM instructions: 0-90 degrees, 2 hours in the morning, 2 hours in the afternoon, and 2 hours in the evening. Place foam block, curve side up under heel at all times except when in CPM or  when walking.  DO NOT modify, tear, cut, or change the foam block in any way.  POST-OPERATIVE OPIOID TAPER INSTRUCTIONS: It is important to wean off of your opioid medication as soon as possible. If you do not need pain medication after your surgery it is ok to stop day one. Opioids include: Codeine, Hydrocodone (Norco, Vicodin), Oxycodone (Percocet, oxycontin ) and hydromorphone  amongst others.  Long term and even short term use of opiods can cause:  Increased pain response Dependence Constipation Depression Respiratory depression And more.  Withdrawal symptoms can include Flu like symptoms Nausea, vomiting And more Techniques to manage these symptoms Hydrate well Eat regular healthy meals Stay active Use relaxation techniques(deep breathing, meditating, yoga) Do Not substitute Alcohol to help with tapering If you have been on opioids for less than two weeks and do not have pain than it is ok to stop all together.  Plan to wean off of opioids This plan should start within one week post op of your joint replacement. Maintain the same interval or time between taking each dose and first decrease the dose.  Cut the total daily intake of opioids by one tablet each day Next start to increase the time between doses. The last dose that should be eliminated is the evening dose.   MAKE SURE YOU:  Understand these instructions.  Get help right away if you are not doing well or get worse.    Thank you for letting us  be a part of your medical care team.  It is a privilege we respect greatly.  We hope these instructions will help you stay on track for a fast and full recovery!    Dental Antibiotics:  In most cases prophylactic antibiotics for Dental procdeures after total joint surgery are not necessary.  Exceptions are as follows:  1. History of prior total joint infection  2. Severely immunocompromised (Organ Transplant, cancer chemotherapy, Rheumatoid biologic meds such as  Humera)  3. Poorly controlled diabetes (A1C &gt; 8.0, blood glucose over 200)  If you have one of these conditions, contact your surgeon for an antibiotic prescription, prior to your dental procedure.   Increase activity slowly as tolerated   Complete by: As directed    Post-operative opioid taper instructions:   Complete by: As directed    POST-OPERATIVE OPIOID TAPER INSTRUCTIONS: It is important to wean off of your opioid medication as soon as possible. If you do not need pain medication after your surgery it is ok to stop day one. Opioids include: Codeine, Hydrocodone (Norco, Vicodin), Oxycodone (Percocet, oxycontin ) and hydromorphone  amongst others.  Long term and even short term use of opiods can cause: Increased pain response Dependence Constipation Depression Respiratory depression And more.  Withdrawal symptoms can include Flu like symptoms Nausea, vomiting And more Techniques to manage these symptoms Hydrate well Eat regular healthy meals Stay active Use relaxation techniques(deep breathing, meditating, yoga) Do Not substitute Alcohol to help with tapering If you have been on opioids for less than two weeks and do not have pain than it is ok to stop all together.  Plan to wean off of opioids This plan should start within one week post op of your joint replacement. Maintain the same interval or time between taking each dose and first decrease the dose.  Cut the total daily intake of opioids by one tablet each day Next start to increase the time between doses. The last dose that should be eliminated is the evening dose.           Follow-up Information     [facility] Follow up.   Why: home health services will be provided by [facility] information: [address], [place], [place] [address]        Vernon Velna SAUNDERS, MD Follow up.   Specialty: Internal Medicine Contact information: [address] [place] [address] [phone]  SignedBETHA Herlene Calix 01/23/2024, 11:28 AM

## 2024-01-24 ENCOUNTER — Other Ambulatory Visit: Payer: Self-pay | Admitting: Orthopedic Surgery

## 2024-01-24 ENCOUNTER — Encounter: Payer: Self-pay | Admitting: Orthopedic Surgery

## 2024-01-24 MED ORDER — OXYCODONE HCL 5 MG PO TABS: 5.0000 mg | ORAL_TABLET | Freq: Four times a day (QID) | ORAL | 0 refills | Status: DC | PRN

## 2024-01-24 NOTE — Telephone Encounter (Signed)
 Med refilled.

## 2024-01-29 DIAGNOSIS — M1711 Unilateral primary osteoarthritis, right knee: Secondary | ICD-10-CM

## 2024-01-30 ENCOUNTER — Encounter: Payer: Self-pay | Admitting: Orthopedic Surgery

## 2024-01-31 ENCOUNTER — Other Ambulatory Visit: Payer: Self-pay | Admitting: Surgical

## 2024-01-31 MED ORDER — METHOCARBAMOL 500 MG PO TABS: 500.0000 mg | ORAL_TABLET | Freq: Three times a day (TID) | ORAL | 2 refills | Status: DC | PRN

## 2024-02-02 ENCOUNTER — Ambulatory Visit

## 2024-02-02 ENCOUNTER — Encounter: Payer: Self-pay | Admitting: Orthopedic Surgery

## 2024-02-02 ENCOUNTER — Ambulatory Visit: Admitting: Orthopedic Surgery

## 2024-02-02 ENCOUNTER — Ambulatory Visit (HOSPITAL_COMMUNITY)
Admission: RE | Admit: 2024-02-02 | Discharge: 2024-02-02 | Disposition: A | Discharge status: Home / Self Care | Source: Ambulatory Visit | Attending: Orthopedic Surgery | Admitting: Orthopedic Surgery

## 2024-02-02 DIAGNOSIS — Z96651 Presence of right artificial knee joint: Secondary | ICD-10-CM

## 2024-02-02 MED ORDER — HYDROCODONE-ACETAMINOPHEN 10-325 MG PO TABS: ORAL_TABLET | ORAL | 0 refills | Status: DC

## 2024-02-02 NOTE — Therapy (Signed)
 OUTPATIENT PHYSICAL THERAPY LOWER EXTREMITY EVALUATION  Date of referral: 02/02/2024 Referring provider: Cordella Glendia Hutchinson, MD Referring diagnosis?  Z96.651 (ICD-10-CM) - S/P total knee arthroplasty, right   Treatment diagnosis? (if different than referring diagnosis) R26.2   M62.81   R60.0   M25.561   M25.661  What was this (referring dx) caused by? Surgery (Type: TKA)  Nature of Condition: Initial Onset (within last 3 months)   Laterality: Rt  Current Functional Measure Score: Patient Specific Functional Scale 0.33  Objective measurements identify impairments when they are compared to normal values, the uninvolved extremity, and prior level of function.  [x]  Yes  []  No  Objective assessment of functional ability: Severe functional limitations   Briefly describe symptoms: Typical post-surgical knee replacement with pain affecting sleep and all functional activities  How did symptoms start: Osteoarthritis leading to a total knee replacement  Average pain intensity:  Last 24 hours: As high as 10/10  Past week: 7-10/10  How often does the pt experience symptoms? Constantly  How much have the symptoms interfered with usual daily activities? Extremely  How has condition changed since care began at this facility? NA - initial visit  In general, how is the patients overall health? Good   BACK PAIN (STarT Back Screening Tool) No   Patient Name: Chelsea Gray MRN: [id number] DOB:12-Oct-1955, 68 y.o., female Today's Date: 02/04/2024  END OF SESSION:  PT End of Session - 02/04/24 1858     Visit Number 1    Number of Visits 24    Date for Recertification  04/28/24    Authorization Type [employer] Medicare    Progress Note Due on Visit 10    PT Start Time 1015    PT Stop Time 1100    PT Time Calculation (min) 45 min    Activity Tolerance Patient tolerated treatment well;No increased pain;Patient limited by pain    Behavior During Therapy [facility] for tasks assessed/performed           Past Medical History:  Diagnosis Date   Anemia    tx w/iron   Blood transfusion    d/t hemmorrhage post c- section   DEPRESSION 01/24/2009   DM 01/24/2009   type 2   H/O blood transfusion reaction 09/1992   cause PE and hemolytic type reaction per pt   History of one miscarriage    HYPERCHOLESTEROLEMIA 01/24/2009   HYPERTENSION 01/24/2009   Hyperthyroidism    per pt on 01/13/24   Osteoarthritis    Pulmonary embolism (HCC)    result of blood transfusion reaction 18 years ago after childbirth   Past Surgical History:  Procedure Laterality Date   BIOPSY  10/09/2019   Procedure: BIOPSY;  Surgeon: Saintclair Jasper, MD;  Location: [facility] ENDOSCOPY;  Service: Gastroenterology;;   BREAST CYST EXCISION     BREAST REDUCTION SURGERY     CERVICAL CERCLAGE     CESAREAN SECTION     COLONOSCOPY WITH PROPOFOL  N/A 10/09/2019   Procedure: COLONOSCOPY WITH PROPOFOL ;  Surgeon: Saintclair Jasper, MD;  Location: [facility] ENDOSCOPY;  Service: Gastroenterology;  Laterality: N/A;   ESOPHAGOGASTRODUODENOSCOPY (EGD) WITH PROPOFOL  N/A 10/09/2019   Procedure: ESOPHAGOGASTRODUODENOSCOPY (EGD) WITH PROPOFOL ;  Surgeon: Saintclair Jasper, MD;  Location: [facility] ENDOSCOPY;  Service: Gastroenterology;  Laterality: N/A;   HEMOSTASIS CLIP PLACEMENT  10/09/2019   Procedure: HEMOSTASIS CLIP PLACEMENT;  Surgeon: Saintclair Jasper, MD;  Location: [facility] ENDOSCOPY;  Service: Gastroenterology;;   HOT HEMOSTASIS N/A 10/09/2019   Procedure: HOT HEMOSTASIS (ARGON PLASMA COAGULATION/BICAP);  Surgeon:  Saintclair Jasper, MD;  Location: [facility] ENDOSCOPY;  Service: Gastroenterology;  Laterality: N/A;   JOINT REPLACEMENT     KNEE ARTHROPLASTY  07/14/2011   Procedure: COMPUTER ASSISTED TOTAL LEFT  KNEE ARTHROPLASTY;  Surgeon: Cordella Glendia Hutchinson, MD;  Location: [facility] OR;  Service: Orthopedics;  Laterality: Left;  Left total knee arthroplasty   POLYPECTOMY  10/09/2019   Procedure: POLYPECTOMY;  Surgeon: Saintclair Jasper, MD;  Location: [facility] ENDOSCOPY;  Service:  Gastroenterology;;   REDUCTION MAMMAPLASTY Bilateral    TOTAL KNEE ARTHROPLASTY Right 01/18/2024   Procedure: RIGHT TOTAL KNEE ARTHROPLASTY;  Surgeon: Hutchinson Cordella Glendia, MD;  Location: [facility] OR;  Service: Orthopedics;  Laterality: Right;   Patient Active Problem List   Diagnosis Date Noted   Arthritis of right knee 01/29/2024   S/P total knee replacement, right 01/18/2024   Hypokalemia 11/01/2023   High anion gap metabolic acidosis 11/01/2023   Pulmonary nodules 11/01/2023   History of COPD 11/01/2023   Hyperthyroidism 11/07/2019   Hyperlipidemia 11/07/2019   Chest pain 10/08/2019   Chest pain with moderate risk for cardiac etiology 10/07/2019   Abdominal pain 12/24/2010   MYALGIA 06/19/2009   NUMBNESS 06/19/2009   DM2 (diabetes mellitus, type 2) (HCC) 01/24/2009   HYPERCHOLESTEROLEMIA 01/24/2009   SMOKER 01/24/2009   DEPRESSION 01/24/2009   Essential hypertension 01/24/2009   CHEST PAIN 01/24/2009   Neuropathy 01/24/2009   Iron deficiency anemia due to chronic blood loss 01/24/2009    PCP: Rinka R. Vernon, MD  REFERRING PROVIDER: Cordella Glendia Hutchinson, MD  REFERRING DIAG:  [phone] (ICD-10-CM) - S/P total knee arthroplasty, right    THERAPY DIAG:  Difficulty in walking, not elsewhere classified - Plan: PT plan of care cert/re-cert  Muscle weakness (generalized) - Plan: PT plan of care cert/re-cert  Localized edema - Plan: PT plan of care cert/re-cert  Acute pain of right knee - Plan: PT plan of care cert/re-cert  Stiffness of right knee, not elsewhere classified - Plan: PT plan of care cert/re-cert  Rationale for Evaluation and Treatment: Rehabilitation  ONSET DATE: 01/18/2024  SUBJECTIVE:   SUBJECTIVE STATEMENT: Chelsea Gray reports good compliance with her current home health exercises.  She would like to get back into walking for exercise in her normal house activities without being limited by her right knee.  PERTINENT HISTORY: Type 2 DM, HTN, HLD, hyperthyroid,  pulmonary embolism, C-section, Bilateral TKA, COPD, neuropathy   PAIN:  Are you having pain? Yes: NPRS scale: 7-10/10 this week Pain location: Rt knee Pain description: Throbbing like a tooth ache Aggravating factors: Prolonged postures and over weight-bearing Relieving factors: Hydrocodone  and muscle relaxers  PRECAUTIONS: None  RED FLAGS: None   WEIGHT BEARING RESTRICTIONS: No  FALLS:  Has patient fallen in last 6 months? No  LIVING ENVIRONMENT: Lives with: lives with their family and lives with their spouse Lives in: House/apartment Stairs: Manages but not easy Has following equipment at home: Environmental Consultant - 2 wheeled  OCCUPATION: Retired  PLOF: Independent  PATIENT GOALS: Return to daily walks of 45-60 minutes and normal ADLs  NEXT MD VISIT: 03/01/2024  OBJECTIVE:  Note: Objective measures were completed at Evaluation unless otherwise noted.  DIAGNOSTIC FINDINGS: See chart  PATIENT SURVEYS:  PSFS: THE PATIENT SPECIFIC FUNCTIONAL SCALE  Place score of 0-10 (0 = unable to perform activity and 10 = able to perform activity at the same level as before injury or problem)  Activity Date: 02/04/2024    Walking for exercise 0    2.  Cleaning 0  3.  Sleeping 1    4.      Total Score 0.33      Total Score = Sum of activity scores/number of activities  Minimally Detectable Change: 3 points (for single activity); 2 points (for average score)  [facility] (nd). The Patient Specific Functional Scale . Retrieved from [email or web address]   COGNITION: Overall cognitive status: Within functional limits for tasks assessed     SENSATION: Chelsea Gray has no complaints of new peripheral pain or paresthesias post surgery  EDEMA:  Noted and not objectively assessed   LOWER EXTREMITY ROM:  Active ROM Left/Right 02/04/2024   Hip flexion    Hip extension    Hip abduction    Hip adduction    Hip internal  rotation    Hip external rotation    Knee flexion 121/119   Knee extension 0/-5   Ankle dorsiflexion    Ankle plantarflexion    Ankle inversion    Ankle eversion     (Blank rows = not tested)  STRENGTH:  In pounds with hand-held dynamometer Left/Right   Hip flexion    Hip extension    Hip abduction    Hip adduction    Hip internal rotation    Hip external rotation    Knee flexion    Knee extension 57.4/28.3   Ankle dorsiflexion    Ankle plantarflexion    Ankle inversion    Ankle eversion     (Blank rows = not tested)  GAIT: Distance walked: 100 feet Assistive device utilized: Walker - 2 wheeled Level of assistance: Complete Independence Comments: Deal with walking frequently for exercise before her knee started limiting her.  She would like to return to this.                                                                                                                                TREATMENT DATE: 02/04/2024 Seated quadriceps sets with a right heel prop 2 sets of 10 for 5 seconds Seated knee extension stretch with 4 pounds hanging from a strap just above her right kneecap 3 minutes  97535: Discussed examination findings; expectations with physical therapy and her day 1 home exercises   PATIENT EDUCATION:  Education details: See above Person educated: Patient Education method: Explanation, Demonstration, Tactile cues, Verbal cues, and Handouts Education comprehension: verbalized understanding, returned demonstration, verbal cues required, tactile cues required, and needs further education  HOME EXERCISE PROGRAM: Access Code: [email or web address] URL: [email or web address] Date: 02/04/2024 Prepared by: Lamar Ivory  Exercises - Supine Quadricep Sets  - 5 x daily - 7 x weekly - 2 sets - 10 reps - 5 second hold - Seated Passive Knee Extension with Weight  - 2 x daily - 7 x weekly - 1 sets - 1 reps - 3-5 minutes hold  ASSESSMENT:  CLINICAL  IMPRESSION: Patient is a 68 y.o. female who was seen today for physical therapy evaluation and treatment for  Z96.651 (ICD-10-CM) - S/P total knee arthroplasty, right  .  Chelsea Gray has very good active range of motion for this point postsurgery, although she is lacking 5 degrees of extension active range of motion.  Strength is also not bad for this point post-surgery, although she will certainly need work to get to where we expect her to be 3 months post-surgery and in the long-term.  Chelsea Gray will benefit from the recommended plan of care.  OBJECTIVE IMPAIRMENTS: Abnormal gait, cardiopulmonary status limiting activity, decreased activity tolerance, decreased balance, decreased endurance, decreased knowledge of condition, difficulty walking, decreased ROM, decreased strength, increased edema, impaired perceived functional ability, increased muscle spasms, and pain.   ACTIVITY LIMITATIONS: bending, sitting, standing, squatting, sleeping, stairs, and locomotion level  PARTICIPATION LIMITATIONS: meal prep, cleaning, laundry, driving, shopping, and community activity  PERSONAL FACTORS: Type 2 DM, HTN, HLD, hyperthyroid, pulmonary embolism, C-section, Bilateral TKA, COPD, neuropathy are also affecting patient's functional outcome.   REHAB POTENTIAL: Good  CLINICAL DECISION MAKING: Evolving/moderate complexity  EVALUATION COMPLEXITY: Moderate   GOALS: Goals reviewed with patient? Yes  SHORT TERM GOALS: Target date: 02/11/2024 Srinidhi will be independent with her day 1 home exercises Baseline: Started 02/04/2024 Goal status: INITIAL  2.  Improve right knee extension active range of motion to within 3 degrees of full extension Baseline: 5 degrees from full extension Goal status: INITIAL  3.  Improve right quadriceps strength to at least 40 pounds Baseline: 28.3 pounds Goal status: INITIAL   LONG TERM GOALS: Target date: 04/28/2024  Improve patient-specific functional score to at least 5 Baseline:  0.33 Goal status: INITIAL  2.  Chelsea Gray will report right knee pain consistently 0-3/10 on the numeric pain rating scale Baseline: 7-10/10 Goal status: INITIAL  3.  Improve right knee active range of motion to at least 0 - 2 - 120 degrees Baseline: 0 - 5 - 119 degrees Goal status: INITIAL  4.  Improve right quadriceps strength to 50+ pounds Baseline: 28.3 pounds Goal status: INITIAL  5.  Chelsea Gray will be independent with her walking without an assistive device and will be able to walk at least 1/4 mile without increasing right knee symptoms Baseline: Wheeled walker Goal status: INITIAL  6.  Chelsea Gray will be independent with her long-term maintenance home exercise program at discharge Baseline: Started 02/04/2024 Goal status: INITIAL   PLAN:  PT FREQUENCY: 2x/week  PT DURATION: 12 weeks  PLANNED INTERVENTIONS: 97750- Physical Performance Testing, 97110-Therapeutic exercises, 97530- Therapeutic activity, 97112- Neuromuscular re-education, 97535- Self Care, 02859- Manual therapy, [phone]- Gait training, [phone]- Vasopneumatic device, Patient/Family education, Balance training, Stair training, Joint mobilization, and Cryotherapy  PLAN FOR NEXT SESSION: Total knee rehabilitation protocol with emphasis on extension active range of motion, strength, balance and function   Myer LELON Ivory, PT, MPT 02/04/2024, 7:16 PM

## 2024-02-02 NOTE — Progress Notes (Signed)
 Post-Op Visit Note   Patient: Chelsea Gray           Date of Birth: 04-22-1955           MRN: [id number] Visit Date: 02/02/2024 PCP: Vernon Velna SAUNDERS, MD   Assessment & Plan:  Chief Complaint:  Chief Complaint  Patient presents with   Right Knee - Routine Post Op    RIGHT TKA (surgery date 01-18-24)   Visit Diagnoses:  1. S/P total knee arthroplasty, right     Plan: [name] is now 2 weeks out right total knee replacement.  On exam she has range of motion 5-1 10.  No calf tenderness negative Homans.  She does have a little bit of tenderness in the posterior aspect of the knee.  She is on Xarelto because of a history of DVT with pregnancy.  Plan at this time is to start physical therapy here 3 times a week for 6 weeks plus a home exercise program to focus on strengthening.  Her range of motion is excellent.  Norco prescribed.  Hold off on oxycodone .  Ultrasound right lower extremity because of some proximal posterior knee tenderness and spasm.  Unlikely that that would be a DVT but if negative then we will change her from Xarelto to aspirin .  She is ambulating with a walker.  Follow-Up Instructions: No follow-ups on file.   Orders:  Orders Placed This Encounter  Procedures   XR Knee 1-2 Views Right   Ambulatory referral to Physical Therapy   VAS US  LOWER EXTREMITY VENOUS (DVT)   Meds ordered this encounter  Medications   HYDROcodone -acetaminophen  (NORCO) 10-325 MG tablet    Sig: 1 po q 4-6 hrs prn pain    Dispense:  30 tablet    Refill:  0    Imaging: VAS US  LOWER EXTREMITY VENOUS (DVT) Result Date: 02/02/2024  Lower Venous DVT Study Patient Name:  Chelsea Gray  Date of Exam:   02/02/2024 Medical Rec #: [id number]         Accession #:    [id number] Date of Birth: 06-20-1955          Patient Gender: F Patient Age:   68 years Exam Location:  [address] Procedure:      VAS US  LOWER EXTREMITY VENOUS (DVT) Referring Phys: CORDELLA HUTCHINSON  --------------------------------------------------------------------------------  Indications: Post-op.  Comparison Study: 10/07/19                   bilateral lower extremity DVT exam was done, the exam was                   negative for DVT Performing Technologist: Dena Pane  Examination Guidelines: A complete evaluation includes B-mode imaging, spectral Doppler, color Doppler, and power Doppler as needed of all accessible portions of each vessel. Bilateral testing is considered an integral part of a complete examination. Limited examinations for reoccurring indications may be performed as noted. The reflux portion of the exam is performed with the patient in reverse Trendelenburg.  +---------+---------------+---------+-----------+----------+--------------+ RIGHT    CompressibilityPhasicitySpontaneityPropertiesThrombus Aging +---------+---------------+---------+-----------+----------+--------------+ CFV      Full           Yes      Yes                                 +---------+---------------+---------+-----------+----------+--------------+ SFJ      Full                                                        +---------+---------------+---------+-----------+----------+--------------+  FV Prox  Full           Yes      Yes                                 +---------+---------------+---------+-----------+----------+--------------+ FV Mid   Full                                                        +---------+---------------+---------+-----------+----------+--------------+ FV DistalFull                                                        +---------+---------------+---------+-----------+----------+--------------+ PFV      Full                                                        +---------+---------------+---------+-----------+----------+--------------+ POP      Full           Yes      Yes                                  +---------+---------------+---------+-----------+----------+--------------+ PTV      Full                                                        +---------+---------------+---------+-----------+----------+--------------+ PERO     Full                                                        +---------+---------------+---------+-----------+----------+--------------+ GSV      Full                                                        +---------+---------------+---------+-----------+----------+--------------+  +----+---------------+---------+-----------+----------+--------------+ LEFTCompressibilityPhasicitySpontaneityPropertiesThrombus Aging +----+---------------+---------+-----------+----------+--------------+ CFV Full           Yes      Yes                                 +----+---------------+---------+-----------+----------+--------------+  Summary: RIGHT: - No evidence of deep vein thrombosis in the lower extremity. No indirect evidence of obstruction proximal to the inguinal ligament. - No evidence of common femoral vein obstruction. - No cystic structure found in the popliteal fossa. - All other veins visualized appear fully compressible and demonstrate appropriate Doppler characteristics.  LEFT: - No evidence of common  femoral vein obstruction.   *See table(s) above for measurements and observations. Electronically signed by Penne Colorado MD on 02/02/2024 at 11:38:04 AM.    Final     PMFS History: Patient Active Problem List   Diagnosis Date Noted   Arthritis of right knee 01/29/2024   S/P total knee replacement, right 01/18/2024   Hypokalemia 11/01/2023   High anion gap metabolic acidosis 11/01/2023   Pulmonary nodules 11/01/2023   History of COPD 11/01/2023   Hyperthyroidism 11/07/2019   Hyperlipidemia 11/07/2019   Chest pain 10/08/2019   Chest pain with moderate risk for cardiac etiology 10/07/2019   Abdominal pain 12/24/2010   MYALGIA 06/19/2009   NUMBNESS  06/19/2009   DM2 (diabetes mellitus, type 2) (HCC) 01/24/2009   HYPERCHOLESTEROLEMIA 01/24/2009   SMOKER 01/24/2009   DEPRESSION 01/24/2009   Essential hypertension 01/24/2009   CHEST PAIN 01/24/2009   Neuropathy 01/24/2009   Iron deficiency anemia due to chronic blood loss 01/24/2009   Past Medical History:  Diagnosis Date   Anemia    tx w/iron   Blood transfusion    d/t hemmorrhage post c- section   DEPRESSION 01/24/2009   DM 01/24/2009   type 2   H/O blood transfusion reaction 09/1992   cause PE and hemolytic type reaction per pt   History of one miscarriage    HYPERCHOLESTEROLEMIA 01/24/2009   HYPERTENSION 01/24/2009   Hyperthyroidism    per pt on 01/13/24   Osteoarthritis    Pulmonary embolism (HCC)    result of blood transfusion reaction 18 years ago after childbirth    Family History  Problem Relation Age of Onset   Diabetes Mother    Hypertension Mother    Diabetes Father    Arrhythmia Father    Heart disease Father        CABG   Breast cancer Sister    Diabetes Sister    Anesthesia problems Neg Hx     Past Surgical History:  Procedure Laterality Date   BIOPSY  10/09/2019   Procedure: BIOPSY;  Surgeon: Saintclair Jasper, MD;  Location: [facility] ENDOSCOPY;  Service: Gastroenterology;;   BREAST CYST EXCISION     BREAST REDUCTION SURGERY     CERVICAL CERCLAGE     CESAREAN SECTION     COLONOSCOPY WITH PROPOFOL  N/A 10/09/2019   Procedure: COLONOSCOPY WITH PROPOFOL ;  Surgeon: Saintclair Jasper, MD;  Location: [facility] ENDOSCOPY;  Service: Gastroenterology;  Laterality: N/A;   ESOPHAGOGASTRODUODENOSCOPY (EGD) WITH PROPOFOL  N/A 10/09/2019   Procedure: ESOPHAGOGASTRODUODENOSCOPY (EGD) WITH PROPOFOL ;  Surgeon: Saintclair Jasper, MD;  Location: [facility] ENDOSCOPY;  Service: Gastroenterology;  Laterality: N/A;   HEMOSTASIS CLIP PLACEMENT  10/09/2019   Procedure: HEMOSTASIS CLIP PLACEMENT;  Surgeon: Saintclair Jasper, MD;  Location: [facility] ENDOSCOPY;  Service: Gastroenterology;;   HOT HEMOSTASIS N/A 10/09/2019    Procedure: HOT HEMOSTASIS (ARGON PLASMA COAGULATION/BICAP);  Surgeon: Saintclair Jasper, MD;  Location: [facility] ENDOSCOPY;  Service: Gastroenterology;  Laterality: N/A;   JOINT REPLACEMENT     KNEE ARTHROPLASTY  07/14/2011   Procedure: COMPUTER ASSISTED TOTAL LEFT  KNEE ARTHROPLASTY;  Surgeon: Cordella Glendia Hutchinson, MD;  Location: [facility] OR;  Service: Orthopedics;  Laterality: Left;  Left total knee arthroplasty   POLYPECTOMY  10/09/2019   Procedure: POLYPECTOMY;  Surgeon: Saintclair Jasper, MD;  Location: [facility] ENDOSCOPY;  Service: Gastroenterology;;   REDUCTION MAMMAPLASTY Bilateral    TOTAL KNEE ARTHROPLASTY Right 01/18/2024   Procedure: RIGHT TOTAL KNEE ARTHROPLASTY;  Surgeon: Hutchinson Cordella Glendia, MD;  Location: [facility] OR;  Service: Orthopedics;  Laterality: Right;  Social History   Occupational History   Occupation: Neurosurgeon: [employer]  Tobacco Use   Smoking status: Every Day    Current packs/day: 0.10    Average packs/day: 0.1 packs/day for 6.0 years (0.6 ttl pk-yrs)    Types: Cigarettes   Smokeless tobacco: Never  Vaping Use   Vaping status: Never Used  Substance and Sexual Activity   Alcohol use: Yes    Comment: socially beer-wine-liquor   Drug use: No   Sexual activity: Yes    Birth control/protection: Post-menopausal

## 2024-02-04 ENCOUNTER — Encounter: Payer: Self-pay | Admitting: Rehabilitative and Restorative Service Providers"

## 2024-02-04 ENCOUNTER — Ambulatory Visit: Admitting: Rehabilitative and Restorative Service Providers"

## 2024-02-04 DIAGNOSIS — R262 Difficulty in walking, not elsewhere classified: Secondary | ICD-10-CM

## 2024-02-04 DIAGNOSIS — R600 Localized edema: Secondary | ICD-10-CM | POA: Diagnosis not present

## 2024-02-04 DIAGNOSIS — M25561 Pain in right knee: Secondary | ICD-10-CM | POA: Diagnosis not present

## 2024-02-04 DIAGNOSIS — M6281 Muscle weakness (generalized): Secondary | ICD-10-CM | POA: Diagnosis not present

## 2024-02-04 DIAGNOSIS — M25661 Stiffness of right knee, not elsewhere classified: Secondary | ICD-10-CM

## 2024-02-06 NOTE — Therapy (Signed)
 OUTPATIENT PHYSICAL THERAPY LOWER EXTREMITY TREATMENT Date of referral: 02/02/2024 Referring provider: Cordella Glendia Hutchinson, MD Referring diagnosis?  Z96.651 (ICD-10-CM) - S/P total knee arthroplasty, right   Treatment diagnosis? (if different than referring diagnosis) R26.2   M62.81   R60.0   M25.561   M25.661  What was this (referring dx) caused by? Surgery (Type: TKA)  Nature of Condition: Initial Onset (within last 3 months)   Laterality: Rt  Current Functional Measure Score: Patient Specific Functional Scale 0.33  Objective measurements identify impairments when they are compared to normal values, the uninvolved extremity, and prior level of function.  [x]  Yes  []  No  Objective assessment of functional ability: Severe functional limitations   Briefly describe symptoms: Typical post-surgical knee replacement with pain affecting sleep and all functional activities  How did symptoms start: Osteoarthritis leading to a total knee replacement  Average pain intensity:  Last 24 hours: As high as 10/10  Past week: 7-10/10  How often does the pt experience symptoms? Constantly  How much have the symptoms interfered with usual daily activities? Extremely  How has condition changed since care began at this facility? NA - initial visit  In general, how is the patients overall health? Good   BACK PAIN (STarT Back Screening Tool) No   Patient Name: Chelsea Gray MRN: [id number] DOB:1955/05/13, 69 y.o., female Today's Date: 02/07/2024  END OF SESSION:  PT End of Session - 02/07/24 1223     Visit Number 2    Number of Visits 24    Date for Recertification  04/28/24    Authorization Type [employer] Medicare    PT Start Time 1142    PT Stop Time 1230    PT Time Calculation (min) 48 min    Activity Tolerance Patient tolerated treatment well    Behavior During Therapy [facility] for tasks assessed/performed           Past Medical History:  Diagnosis Date   Anemia    tx w/iron    Blood transfusion    d/t hemmorrhage post c- section   DEPRESSION 01/24/2009   DM 01/24/2009   type 2   H/O blood transfusion reaction 09/1992   cause PE and hemolytic type reaction per pt   History of one miscarriage    HYPERCHOLESTEROLEMIA 01/24/2009   HYPERTENSION 01/24/2009   Hyperthyroidism    per pt on 01/13/24   Osteoarthritis    Pulmonary embolism (HCC)    result of blood transfusion reaction 18 years ago after childbirth   Past Surgical History:  Procedure Laterality Date   BIOPSY  10/09/2019   Procedure: BIOPSY;  Surgeon: Saintclair Jasper, MD;  Location: [facility] ENDOSCOPY;  Service: Gastroenterology;;   BREAST CYST EXCISION     BREAST REDUCTION SURGERY     CERVICAL CERCLAGE     CESAREAN SECTION     COLONOSCOPY WITH PROPOFOL  N/A 10/09/2019   Procedure: COLONOSCOPY WITH PROPOFOL ;  Surgeon: Saintclair Jasper, MD;  Location: [facility] ENDOSCOPY;  Service: Gastroenterology;  Laterality: N/A;   ESOPHAGOGASTRODUODENOSCOPY (EGD) WITH PROPOFOL  N/A 10/09/2019   Procedure: ESOPHAGOGASTRODUODENOSCOPY (EGD) WITH PROPOFOL ;  Surgeon: Saintclair Jasper, MD;  Location: [facility] ENDOSCOPY;  Service: Gastroenterology;  Laterality: N/A;   HEMOSTASIS CLIP PLACEMENT  10/09/2019   Procedure: HEMOSTASIS CLIP PLACEMENT;  Surgeon: Saintclair Jasper, MD;  Location: [facility] ENDOSCOPY;  Service: Gastroenterology;;   HOT HEMOSTASIS N/A 10/09/2019   Procedure: HOT HEMOSTASIS (ARGON PLASMA COAGULATION/BICAP);  Surgeon: Saintclair Jasper, MD;  Location: [facility] ENDOSCOPY;  Service: Gastroenterology;  Laterality: N/A;  JOINT REPLACEMENT     KNEE ARTHROPLASTY  07/14/2011   Procedure: COMPUTER ASSISTED TOTAL LEFT  KNEE ARTHROPLASTY;  Surgeon: Cordella Glendia Hutchinson, MD;  Location: [facility] OR;  Service: Orthopedics;  Laterality: Left;  Left total knee arthroplasty   POLYPECTOMY  10/09/2019   Procedure: POLYPECTOMY;  Surgeon: Saintclair Jasper, MD;  Location: [facility] ENDOSCOPY;  Service: Gastroenterology;;   REDUCTION MAMMAPLASTY Bilateral    TOTAL KNEE ARTHROPLASTY Right  01/18/2024   Procedure: RIGHT TOTAL KNEE ARTHROPLASTY;  Surgeon: Hutchinson Cordella Glendia, MD;  Location: [facility] OR;  Service: Orthopedics;  Laterality: Right;   Patient Active Problem List   Diagnosis Date Noted   Arthritis of right knee 01/29/2024   S/P total knee replacement, right 01/18/2024   Hypokalemia 11/01/2023   High anion gap metabolic acidosis 11/01/2023   Pulmonary nodules 11/01/2023   History of COPD 11/01/2023   Hyperthyroidism 11/07/2019   Hyperlipidemia 11/07/2019   Chest pain 10/08/2019   Chest pain with moderate risk for cardiac etiology 10/07/2019   Abdominal pain 12/24/2010   MYALGIA 06/19/2009   NUMBNESS 06/19/2009   DM2 (diabetes mellitus, type 2) (HCC) 01/24/2009   HYPERCHOLESTEROLEMIA 01/24/2009   SMOKER 01/24/2009   DEPRESSION 01/24/2009   Essential hypertension 01/24/2009   CHEST PAIN 01/24/2009   Neuropathy 01/24/2009   Iron deficiency anemia due to chronic blood loss 01/24/2009    PCP: Rinka R. Vernon, MD  REFERRING PROVIDER: Cordella Glendia Hutchinson, MD  REFERRING DIAG:  [phone] (ICD-10-CM) - S/P total knee arthroplasty, right    THERAPY DIAG:  Difficulty in walking, not elsewhere classified  Muscle weakness (generalized)  Localized edema  Acute pain of right knee  Stiffness of right knee, not elsewhere classified  Rationale for Evaluation and Treatment: Rehabilitation  ONSET DATE: 01/18/2024  SUBJECTIVE:   SUBJECTIVE STATEMENT: [name] reports good compliance with her current HEP except prone knee flexion. Knee pain is less than last week.  PERTINENT HISTORY: Type 2 DM, HTN, HLD, hyperthyroid, pulmonary embolism, C-section, Bilateral TKA, COPD, neuropathy   PAIN:  Are you having pain? Yes: NPRS scale: 5/10  Pain location: Rt knee Pain description: Throbbing like a tooth ache Aggravating factors: Prolonged postures and over weight-bearing Relieving factors: Hydrocodone  and muscle relaxers  PRECAUTIONS: None  RED  FLAGS: None   WEIGHT BEARING RESTRICTIONS: No  FALLS:  Has patient fallen in last 6 months? No  LIVING ENVIRONMENT: Lives with: lives with their family and lives with their spouse Lives in: House/apartment Stairs: Manages but not easy Has following equipment at home: Environmental Consultant - 2 wheeled  OCCUPATION: Retired  PLOF: Independent  PATIENT GOALS: Return to daily walks of 45-60 minutes and normal ADLs  NEXT MD VISIT: 03/01/2024  OBJECTIVE:  Note: Objective measures were completed at Evaluation unless otherwise noted.  DIAGNOSTIC FINDINGS: See chart  PATIENT SURVEYS:  PSFS: THE PATIENT SPECIFIC FUNCTIONAL SCALE  Place score of 0-10 (0 = unable to perform activity and 10 = able to perform activity at the same level as before injury or problem)  Activity Date: 02/04/2024    Walking for exercise 0    2.  Cleaning 0    3.  Sleeping 1    4.      Total Score 0.33      Total Score = Sum of activity scores/number of activities  Minimally Detectable Change: 3 points (for single activity); 2 points (for average score)  [facility] (nd). The Patient Specific Functional Scale . Retrieved from [email or web address]   COGNITION: Overall cognitive  status: Within functional limits for tasks assessed     SENSATION: Cheryl has no complaints of new peripheral pain or paresthesias post surgery  EDEMA:  Noted and not objectively assessed   LOWER EXTREMITY ROM:  Active ROM Left/Right 02/04/2024   Hip flexion    Hip extension    Hip abduction    Hip adduction    Hip internal rotation    Hip external rotation    Knee flexion 121/119   Knee extension 0/-5   Ankle dorsiflexion    Ankle plantarflexion    Ankle inversion    Ankle eversion     (Blank rows = not tested)  STRENGTH:  In pounds with hand-held dynamometer Left/Right   Hip flexion    Hip extension    Hip abduction    Hip adduction    Hip internal  rotation    Hip external rotation    Knee flexion    Knee extension 57.4/28.3   Ankle dorsiflexion    Ankle plantarflexion    Ankle inversion    Ankle eversion     (Blank rows = not tested)  GAIT: Distance walked: 100 feet Assistive device utilized: Walker - 2 wheeled Level of assistance: Complete Independence Comments: Deal with walking frequently for exercise before her knee started limiting her.  She would like to return to this.                                                                                                                                TREATMENT DATE: R TKA 02/07/24 Nustep 6 min level 3 Quad sets with towel roll under ankle 3x10 SLR 3x10 Heel slides active 2x10 Seated LAQ 2x10  #2 2x10 Standing mini squats 2x10 Standing heel raises 2x10 Manual PROM and hamstring stretching Vaso at 36 degrees medium compression x10 min for edema   02/04/2024 Seated quadriceps sets with a right heel prop 2 sets of 10 for 5 seconds Seated knee extension stretch with 4 pounds hanging from a strap just above her right kneecap 3 minutes  [id number]: Discussed examination findings; expectations with physical therapy and her day 1 home exercises   PATIENT EDUCATION:  Education details: See above Person educated: Patient Education method: Explanation, Demonstration, Tactile cues, Verbal cues, and Handouts Education comprehension: verbalized understanding, returned demonstration, verbal cues required, tactile cues required, and needs further education  HOME EXERCISE PROGRAM: Access Code: [email or web address] URL: [email or web address] Date: 02/04/2024 Prepared by: Lamar Ivory  Exercises - Supine Quadricep Sets  - 5 x daily - 7 x weekly - 2 sets - 10 reps - 5 second hold - Seated Passive Knee Extension with Weight  - 2 x daily - 7 x weekly - 1 sets - 1 reps - 3-5 minutes hold  ASSESSMENT:  CLINICAL IMPRESSION: Patient needed VC and HHA to initiate SLR due to strength  deficits. OBJECTIVE IMPAIRMENTS: Abnormal gait, cardiopulmonary status limiting activity, decreased activity tolerance, decreased balance, decreased endurance, decreased  knowledge of condition, difficulty walking, decreased ROM, decreased strength, increased edema, impaired perceived functional ability, increased muscle spasms, and pain.   ACTIVITY LIMITATIONS: bending, sitting, standing, squatting, sleeping, stairs, and locomotion level  PARTICIPATION LIMITATIONS: meal prep, cleaning, laundry, driving, shopping, and community activity  PERSONAL FACTORS: Type 2 DM, HTN, HLD, hyperthyroid, pulmonary embolism, C-section, Bilateral TKA, COPD, neuropathy are also affecting patient's functional outcome.   REHAB POTENTIAL: Good  CLINICAL DECISION MAKING: Evolving/moderate complexity  EVALUATION COMPLEXITY: Moderate   GOALS: Goals reviewed with patient? Yes  SHORT TERM GOALS: Target date: 02/11/2024 Adaleah will be independent with her day 1 home exercises Baseline: Started 02/04/2024 Goal status: MET 02/07/24  2.  Improve right knee extension active range of motion to within 3 degrees of full extension Baseline: 5 degrees from full extension Goal status: Ongoing 02/07/24  3.  Improve right quadriceps strength to at least 40 pounds Baseline: 28.3 pounds Goal status: Ongoing 02/07/24   LONG TERM GOALS: Target date: 04/28/2024  Improve patient-specific functional score to at least 5 Baseline: 0.33 Goal status: INITIAL  2.  Amarii will report right knee pain consistently 0-3/10 on the numeric pain rating scale Baseline: 7-10/10 Goal status: INITIAL  3.  Improve right knee active range of motion to at least 0 - 2 - 120 degrees Baseline: 0 - 5 - 119 degrees Goal status: INITIAL  4.  Improve right quadriceps strength to 50+ pounds Baseline: 28.3 pounds Goal status: INITIAL  5.  Lonnette will be independent with her walking without an assistive device and will be able to walk at least 1/4 mile  without increasing right knee symptoms Baseline: Wheeled walker Goal status: INITIAL  6.  Kadance will be independent with her long-term maintenance home exercise program at discharge Baseline: Started 02/04/2024 Goal status: INITIAL   PLAN:  PT FREQUENCY: 2x/week  PT DURATION: 12 weeks  PLANNED INTERVENTIONS: 97750- Physical Performance Testing, 97110-Therapeutic exercises, 97530- Therapeutic activity, 97112- Neuromuscular re-education, 97535- Self Care, 02859- Manual therapy, [phone]- Gait training, [phone]- Vasopneumatic device, Patient/Family education, Balance training, Stair training, Joint mobilization, and Cryotherapy  PLAN FOR NEXT SESSION: Total knee rehabilitation protocol with emphasis on extension active range of motion, strength, balance and function   Burnard CHRISTELLA Meth, PT, MPT 02/07/2024, 12:25 PM

## 2024-02-07 ENCOUNTER — Ambulatory Visit

## 2024-02-07 ENCOUNTER — Encounter: Payer: Self-pay | Admitting: Radiology

## 2024-02-07 DIAGNOSIS — R262 Difficulty in walking, not elsewhere classified: Secondary | ICD-10-CM

## 2024-02-07 DIAGNOSIS — M6281 Muscle weakness (generalized): Secondary | ICD-10-CM

## 2024-02-07 DIAGNOSIS — R600 Localized edema: Secondary | ICD-10-CM

## 2024-02-07 DIAGNOSIS — M25561 Pain in right knee: Secondary | ICD-10-CM

## 2024-02-07 DIAGNOSIS — M25661 Stiffness of right knee, not elsewhere classified: Secondary | ICD-10-CM

## 2024-02-10 ENCOUNTER — Encounter: Payer: Self-pay | Admitting: Orthopedic Surgery

## 2024-02-11 ENCOUNTER — Encounter: Payer: Self-pay | Admitting: Physical Therapy

## 2024-02-11 ENCOUNTER — Other Ambulatory Visit: Payer: Self-pay | Admitting: Surgical

## 2024-02-11 ENCOUNTER — Ambulatory Visit: Admitting: Physical Therapy

## 2024-02-11 DIAGNOSIS — R600 Localized edema: Secondary | ICD-10-CM | POA: Diagnosis not present

## 2024-02-11 DIAGNOSIS — M25561 Pain in right knee: Secondary | ICD-10-CM | POA: Diagnosis not present

## 2024-02-11 DIAGNOSIS — R262 Difficulty in walking, not elsewhere classified: Secondary | ICD-10-CM | POA: Diagnosis not present

## 2024-02-11 DIAGNOSIS — M25661 Stiffness of right knee, not elsewhere classified: Secondary | ICD-10-CM

## 2024-02-11 DIAGNOSIS — M6281 Muscle weakness (generalized): Secondary | ICD-10-CM

## 2024-02-11 MED ORDER — HYDROCODONE-ACETAMINOPHEN 10-325 MG PO TABS: 1.0000 | ORAL_TABLET | Freq: Three times a day (TID) | ORAL | 0 refills | Status: AC | PRN

## 2024-02-11 MED ORDER — METHOCARBAMOL 500 MG PO TABS: 500.0000 mg | ORAL_TABLET | Freq: Three times a day (TID) | ORAL | 2 refills | Status: AC | PRN

## 2024-02-11 NOTE — Therapy (Signed)
 OUTPATIENT PHYSICAL THERAPY LOWER EXTREMITY TREATMENT   Patient Name: Chelsea Gray MRN: [id number] DOB:06-28-1955, 68 y.o., female Today's Date: 02/11/2024  END OF SESSION:  PT End of Session - 02/11/24 0927     Visit Number 3    Number of Visits 24    Date for Recertification  04/28/24    Authorization Type [employer] Medicare    PT Start Time [phone]    PT Stop Time 1012    PT Time Calculation (min) 44 min    Activity Tolerance Patient tolerated treatment well    Behavior During Therapy [facility] for tasks assessed/performed            Past Medical History:  Diagnosis Date   Anemia    tx w/iron   Blood transfusion    d/t hemmorrhage post c- section   DEPRESSION 01/24/2009   DM 01/24/2009   type 2   H/O blood transfusion reaction 09/1992   cause PE and hemolytic type reaction per pt   History of one miscarriage    HYPERCHOLESTEROLEMIA 01/24/2009   HYPERTENSION 01/24/2009   Hyperthyroidism    per pt on 01/13/24   Osteoarthritis    Pulmonary embolism (HCC)    result of blood transfusion reaction 18 years ago after childbirth   Past Surgical History:  Procedure Laterality Date   BIOPSY  10/09/2019   Procedure: BIOPSY;  Surgeon: Saintclair Jasper, MD;  Location[facility] ENDOSCOPY;  Service: Gastroenterology;;   BREAST CYST EXCISION     BREAST REDUCTION SURGERY     CERVICAL CERCLAGE     CESAREAN SECTION     COLONOSCOPY WITH PROPOFOL  N/A 10/09/2019   Procedure: COLONOSCOPY WITH PROPOFOL ;  Surgeon: Saintclair Jasper, MD;  Location: [facility] ENDOSCOPY;  Service: Gastroenterology;  Laterality: N/A;   ESOPHAGOGASTRODUODENOSCOPY (EGD) WITH PROPOFOL  N/A 10/09/2019   Procedure: ESOPHAGOGASTRODUODENOSCOPY (EGD) WITH PROPOFOL ;  Surgeon: Saintclair Jasper, MD;  Location: [facility] ENDOSCOPY;  Service: Gastroenterology;  Laterality: N/A;   HEMOSTASIS CLIP PLACEMENT  10/09/2019   Procedure: HEMOSTASIS CLIP PLACEMENT;  Surgeon: Saintclair Jasper, MD;  Location: [facility] ENDOSCOPY;  Service: Gastroenterology;;   HOT HEMOSTASIS N/A  10/09/2019   Procedure: HOT HEMOSTASIS (ARGON PLASMA COAGULATION/BICAP);  Surgeon: Saintclair Jasper, MD;  Location: [facility] ENDOSCOPY;  Service: Gastroenterology;  Laterality: N/A;   JOINT REPLACEMENT     KNEE ARTHROPLASTY  07/14/2011   Procedure: COMPUTER ASSISTED TOTAL LEFT  KNEE ARTHROPLASTY;  Surgeon: Cordella Glendia Hutchinson, MD;  Location: [facility] OR;  Service: Orthopedics;  Laterality: Left;  Left total knee arthroplasty   POLYPECTOMY  10/09/2019   Procedure: POLYPECTOMY;  Surgeon: Saintclair Jasper, MD;  Location: [facility] ENDOSCOPY;  Service: Gastroenterology;;   REDUCTION MAMMAPLASTY Bilateral    TOTAL KNEE ARTHROPLASTY Right 01/18/2024   Procedure: RIGHT TOTAL KNEE ARTHROPLASTY;  Surgeon: Hutchinson Cordella Glendia, MD;  Location: [facility] OR;  Service: Orthopedics;  Laterality: Right;   Patient Active Problem List   Diagnosis Date Noted   Arthritis of right knee 01/29/2024   S/P total knee replacement, right 01/18/2024   Hypokalemia 11/01/2023   High anion gap metabolic acidosis 11/01/2023   Pulmonary nodules 11/01/2023   History of COPD 11/01/2023   Hyperthyroidism 11/07/2019   Hyperlipidemia 11/07/2019   Chest pain 10/08/2019   Chest pain with moderate risk for cardiac etiology 10/07/2019   Abdominal pain 12/24/2010   MYALGIA 06/19/2009   NUMBNESS 06/19/2009   DM2 (diabetes mellitus, type 2) (HCC) 01/24/2009   HYPERCHOLESTEROLEMIA 01/24/2009   SMOKER 01/24/2009   DEPRESSION 01/24/2009   Essential hypertension 01/24/2009  CHEST PAIN 01/24/2009   Neuropathy 01/24/2009   Iron deficiency anemia due to chronic blood loss 01/24/2009    PCP: Rinka R. Vernon, MD  REFERRING PROVIDER: Cordella Glendia Hutchinson, MD  REFERRING DIAG:  [phone] (ICD-10-CM) - S/P total knee arthroplasty, right    THERAPY DIAG:  Difficulty in walking, not elsewhere classified  Muscle weakness (generalized)  Localized edema  Acute pain of right knee  Stiffness of right knee, not elsewhere classified  Rationale for Evaluation and  Treatment: Rehabilitation  ONSET DATE: 01/18/2024  SUBJECTIVE:   SUBJECTIVE STATEMENT: Pain on average is around a 4/10, but today has been a little higher at 5/10.   PERTINENT HISTORY: Type 2 DM, HTN, HLD, hyperthyroid, pulmonary embolism, C-section, Bilateral TKA, COPD, neuropathy   PAIN:  Are you having pain? Yes: NPRS scale: 5/10  Pain location: Rt knee Pain description: Throbbing like a tooth ache Aggravating factors: Prolonged postures and over weight-bearing Relieving factors: Hydrocodone  and muscle relaxers  PRECAUTIONS: None  RED FLAGS: None   WEIGHT BEARING RESTRICTIONS: No  FALLS:  Has patient fallen in last 6 months? No  LIVING ENVIRONMENT: Lives with: lives with their family and lives with their spouse Lives in: House/apartment Stairs: Manages but not easy Has following equipment at home: Environmental Consultant - 2 wheeled  OCCUPATION: Retired  PLOF: Independent  PATIENT GOALS: Return to daily walks of 45-60 minutes and normal ADLs  NEXT MD VISIT: 03/01/2024  OBJECTIVE:  Note: Objective measures were completed at Evaluation unless otherwise noted.  DIAGNOSTIC FINDINGS: See chart  PATIENT SURVEYS:  PSFS: THE PATIENT SPECIFIC FUNCTIONAL SCALE  Place score of 0-10 (0 = unable to perform activity and 10 = able to perform activity at the same level as before injury or problem)  Activity Date: 02/04/2024    Walking for exercise 0    2.  Cleaning 0    3.  Sleeping 1    4.      Total Score 0.33      Total Score = Sum of activity scores/number of activities  Minimally Detectable Change: 3 points (for single activity); 2 points (for average score)  [employer] [facility] (nd). The Patient Specific Functional Scale . Retrieved from [email or web address].[email or web address]   COGNITION: Overall cognitive status: Within functional limits for tasks assessed     SENSATION: Cheryl has no complaints of new peripheral pain or  paresthesias post surgery  EDEMA:  Noted and not objectively assessed   LOWER EXTREMITY ROM:  Active ROM Left/Right 02/04/2024 Right 02/11/24  Knee flexion 121/119 123  Knee extension 0/-5 -2   (Blank rows = not tested)  STRENGTH:  In pounds with hand-held dynamometer Left/Right   Hip flexion    Hip extension    Hip abduction    Hip adduction    Hip internal rotation    Hip external rotation    Knee flexion    Knee extension 57.4/28.3   Ankle dorsiflexion    Ankle plantarflexion    Ankle inversion    Ankle eversion     (Blank rows = not tested)  GAIT: Distance walked: 100 feet Assistive device utilized: Walker - 2 wheeled Level of assistance: Complete Independence Comments: Deal with walking frequently for exercise before her knee started limiting her.  She would like to return to this.  TREATMENT DATE: R TKA 02/11/24 TherAct NuStep L4 x 8 min; LEs only Leg press bil 75# 3x10 RLE only 31# x 10, then 25# 2x10 Seated LAQ with 4# on Rt; 3 sec hold; 2x10 Sit to/from stand 2x10 from elevated mat table (20)    Vaso at 34 degrees medium compression x10 min for edema; Rt knee   02/07/24 Nustep 6 min level 3 Quad sets with towel roll under ankle 3x10 SLR 3x10 Heel slides active 2x10 Seated LAQ 2x10  #2 2x10 Standing mini squats 2x10 Standing heel raises 2x10 Manual PROM and hamstring stretching Vaso at 36 degrees medium compression x10 min for edema   02/04/2024 Seated quadriceps sets with a right heel prop 2 sets of 10 for 5 seconds Seated knee extension stretch with 4 pounds hanging from a strap just above her right kneecap 3 minutes  97535: Discussed examination findings; expectations with physical therapy and her day 1 home exercises   PATIENT EDUCATION:  Education details: See above Person educated: Patient Education  method: Explanation, Demonstration, Tactile cues, Verbal cues, and Handouts Education comprehension: verbalized understanding, returned demonstration, verbal cues required, tactile cues required, and needs further education  HOME EXERCISE PROGRAM: Access Code: [email or web address] URL: [email or web address] Date: 02/04/2024 Prepared by: Lamar Ivory  Exercises - Supine Quadricep Sets  - 5 x daily - 7 x weekly - 2 sets - 10 reps - 5 second hold - Seated Passive Knee Extension with Weight  - 2 x daily - 7 x weekly - 1 sets - 1 reps - 3-5 minutes hold  ASSESSMENT:  CLINICAL IMPRESSION: Able to progress strengthening exercises today with good tolerance.  Excellent ROM, will continue to work on balance and strength to maximize function.   OBJECTIVE IMPAIRMENTS: Abnormal gait, cardiopulmonary status limiting activity, decreased activity tolerance, decreased balance, decreased endurance, decreased knowledge of condition, difficulty walking, decreased ROM, decreased strength, increased edema, impaired perceived functional ability, increased muscle spasms, and pain.   ACTIVITY LIMITATIONS: bending, sitting, standing, squatting, sleeping, stairs, and locomotion level  PARTICIPATION LIMITATIONS: meal prep, cleaning, laundry, driving, shopping, and community activity  PERSONAL FACTORS: Type 2 DM, HTN, HLD, hyperthyroid, pulmonary embolism, C-section, Bilateral TKA, COPD, neuropathy are also affecting patient's functional outcome.   REHAB POTENTIAL: Good  CLINICAL DECISION MAKING: Evolving/moderate complexity  EVALUATION COMPLEXITY: Moderate   GOALS: Goals reviewed with patient? Yes  SHORT TERM GOALS: Target date: 02/11/2024 Shunta will be independent with her day 1 home exercises Baseline: Started 02/04/2024 Goal status: MET 02/07/24  2.  Improve right knee extension active range of motion to within 3 degrees of full extension Baseline: 5 degrees from full extension Goal status: MET  02/11/24  3.  Improve right quadriceps strength to at least 40 pounds Baseline: 28.3 pounds Goal status: Ongoing 02/07/24   LONG TERM GOALS: Target date: 04/28/2024  Improve patient-specific functional score to at least 5 Baseline: 0.33 Goal status: INITIAL  2.  Nare will report right knee pain consistently 0-3/10 on the numeric pain rating scale Baseline: 7-10/10 Goal status: INITIAL  3.  Improve right knee active range of motion to at least 0 - 2 - 120 degrees Baseline: 0 - 5 - 119 degrees Goal status: INITIAL  4.  Improve right quadriceps strength to 50+ pounds Baseline: 28.3 pounds Goal status: INITIAL  5.  Averly will be independent with her walking without an assistive device and will be able to walk at least 1/4 mile without increasing right knee symptoms Baseline: Wheeled walker Goal status: INITIAL  6.  Rosary will be independent with her long-term maintenance home exercise program at discharge Baseline: Started 02/04/2024 Goal status: INITIAL   PLAN:  PT FREQUENCY: 2x/week  PT DURATION: 12 weeks  PLANNED INTERVENTIONS: 97750- Physical Performance Testing, 97110-Therapeutic exercises, 97530- Therapeutic activity, 97112- Neuromuscular re-education, 97535- Self Care, 02859- Manual therapy, [phone]- Gait training, [phone]- Vasopneumatic device, Patient/Family education, Balance training, Stair training, Joint mobilization, and Cryotherapy  PLAN FOR NEXT SESSION: gait without cane, Total knee rehabilitation protocol with emphasis on extension active range of motion, strength, balance and function   Corean JULIANNA Ku, PT, DPT 02/11/24 10:06 AM   Date of referral: 02/02/2024 Referring provider: Cordella Glendia Hutchinson, MD Referring diagnosis?  Z96.651 (ICD-10-CM) - S/P total knee arthroplasty, right   Treatment diagnosis? (if different than referring diagnosis) R26.2   M62.81   R60.0   M25.561   M25.661  What was this (referring dx) caused by? Surgery (Type:  TKA)  Nature of Condition: Initial Onset (within last 3 months)   Laterality: Rt  Current Functional Measure Score: Patient Specific Functional Scale 0.33  Objective measurements identify impairments when they are compared to normal values, the uninvolved extremity, and prior level of function.  [x]  Yes  []  No  Objective assessment of functional ability: Severe functional limitations   Briefly describe symptoms: Typical post-surgical knee replacement with pain affecting sleep and all functional activities  How did symptoms start: Osteoarthritis leading to a total knee replacement  Average pain intensity:  Last 24 hours: As high as 10/10  Past week: 7-10/10  How often does the pt experience symptoms? Constantly  How much have the symptoms interfered with usual daily activities? Extremely  How has condition changed since care began at this facility? NA - initial visit  In general, how is the patients overall health? Good   BACK PAIN (STarT Back Screening Tool) No

## 2024-02-15 ENCOUNTER — Encounter: Admitting: Physical Therapy

## 2024-02-15 NOTE — Therapy (Incomplete)
 OUTPATIENT PHYSICAL THERAPY LOWER EXTREMITY TREATMENT   Patient Name: Chelsea Gray MRN: [id number] DOB:1955/05/09, 68 y.o., female Today's Date: 02/15/2024  END OF SESSION:      Past Medical History:  Diagnosis Date   Anemia    tx w/iron   Blood transfusion    d/t hemmorrhage post c- section   DEPRESSION 01/24/2009   DM 01/24/2009   type 2   H/O blood transfusion reaction 09/1992   cause PE and hemolytic type reaction per pt   History of one miscarriage    HYPERCHOLESTEROLEMIA 01/24/2009   HYPERTENSION 01/24/2009   Hyperthyroidism    per pt on 01/13/24   Osteoarthritis    Pulmonary embolism (HCC)    result of blood transfusion reaction 18 years ago after childbirth   Past Surgical History:  Procedure Laterality Date   BIOPSY  10/09/2019   Procedure: BIOPSY;  Surgeon: Saintclair Jasper, MD;  Location: [facility] ENDOSCOPY;  Service: Gastroenterology;;   BREAST CYST EXCISION     BREAST REDUCTION SURGERY     CERVICAL CERCLAGE     CESAREAN SECTION     COLONOSCOPY WITH PROPOFOL  N/A 10/09/2019   Procedure: COLONOSCOPY WITH PROPOFOL ;  Surgeon: Saintclair Jasper, MD;  Location: [facility] ENDOSCOPY;  Service: Gastroenterology;  Laterality: N/A;   ESOPHAGOGASTRODUODENOSCOPY (EGD) WITH PROPOFOL  N/A 10/09/2019   Procedure: ESOPHAGOGASTRODUODENOSCOPY (EGD) WITH PROPOFOL ;  Surgeon: Saintclair Jasper, MD;  Location: [facility] ENDOSCOPY;  Service: Gastroenterology;  Laterality: N/A;   HEMOSTASIS CLIP PLACEMENT  10/09/2019   Procedure: HEMOSTASIS CLIP PLACEMENT;  Surgeon: Saintclair Jasper, MD;  Location: [facility] ENDOSCOPY;  Service: Gastroenterology;;   HOT HEMOSTASIS N/A 10/09/2019   Procedure: HOT HEMOSTASIS (ARGON PLASMA COAGULATION/BICAP);  Surgeon: Saintclair Jasper, MD;  Location: [facility] ENDOSCOPY;  Service: Gastroenterology;  Laterality: N/A;   JOINT REPLACEMENT     KNEE ARTHROPLASTY  07/14/2011   Procedure: COMPUTER ASSISTED TOTAL LEFT  KNEE ARTHROPLASTY;  Surgeon: Cordella Glendia Hutchinson, MD;  Location: [facility] OR;  Service: Orthopedics;   Laterality: Left;  Left total knee arthroplasty   POLYPECTOMY  10/09/2019   Procedure: POLYPECTOMY;  Surgeon: Saintclair Jasper, MD;  Location: [facility] ENDOSCOPY;  Service: Gastroenterology;;   REDUCTION MAMMAPLASTY Bilateral    TOTAL KNEE ARTHROPLASTY Right 01/18/2024   Procedure: RIGHT TOTAL KNEE ARTHROPLASTY;  Surgeon: Hutchinson Cordella Glendia, MD;  Location: [facility] OR;  Service: Orthopedics;  Laterality: Right;   Patient Active Problem List   Diagnosis Date Noted   Arthritis of right knee 01/29/2024   S/P total knee replacement, right 01/18/2024   Hypokalemia 11/01/2023   High anion gap metabolic acidosis 11/01/2023   Pulmonary nodules 11/01/2023   History of COPD 11/01/2023   Hyperthyroidism 11/07/2019   Hyperlipidemia 11/07/2019   Chest pain 10/08/2019   Chest pain with moderate risk for cardiac etiology 10/07/2019   Abdominal pain 12/24/2010   MYALGIA 06/19/2009   NUMBNESS 06/19/2009   DM2 (diabetes mellitus, type 2) (HCC) 01/24/2009   HYPERCHOLESTEROLEMIA 01/24/2009   SMOKER 01/24/2009   DEPRESSION 01/24/2009   Essential hypertension 01/24/2009   CHEST PAIN 01/24/2009   Neuropathy 01/24/2009   Iron deficiency anemia due to chronic blood loss 01/24/2009    PCP: Rinka R. Vernon, MD  REFERRING PROVIDER: Cordella Glendia Hutchinson, MD  REFERRING DIAG:  [phone] (ICD-10-CM) - S/P total knee arthroplasty, right    THERAPY DIAG:  No diagnosis found.  Rationale for Evaluation and Treatment: Rehabilitation  ONSET DATE: 01/18/2024  SUBJECTIVE:   SUBJECTIVE STATEMENT: *** Pain on average is around a 4/10, but today has been a  little higher at 5/10.   PERTINENT HISTORY: Type 2 DM, HTN, HLD, hyperthyroid, pulmonary embolism, C-section, Bilateral TKA, COPD, neuropathy   PAIN:  Are you having pain? Yes: NPRS scale: 5/10  Pain location: Rt knee Pain description: Throbbing like a tooth ache Aggravating factors: Prolonged postures and over weight-bearing Relieving factors: Hydrocodone  and  muscle relaxers  PRECAUTIONS: None  RED FLAGS: None   WEIGHT BEARING RESTRICTIONS: No  FALLS:  Has patient fallen in last 6 months? No  LIVING ENVIRONMENT: Lives with: lives with their family and lives with their spouse Lives in: House/apartment Stairs: Manages but not easy Has following equipment at home: Environmental Consultant - 2 wheeled  OCCUPATION: Retired  PLOF: Independent  PATIENT GOALS: Return to daily walks of 45-60 minutes and normal ADLs  NEXT MD VISIT: 03/01/2024  OBJECTIVE:  Note: Objective measures were completed at Evaluation unless otherwise noted.  DIAGNOSTIC FINDINGS: See chart  PATIENT SURVEYS:  PSFS: THE PATIENT SPECIFIC FUNCTIONAL SCALE  Place score of 0-10 (0 = unable to perform activity and 10 = able to perform activity at the same level as before injury or problem)  Activity Date: 02/04/2024    Walking for exercise 0    2.  Cleaning 0    3.  Sleeping 1    4.      Total Score 0.33      Total Score = Sum of activity scores/number of activities  Minimally Detectable Change: 3 points (for single activity); 2 points (for average score)  [employer] (nd). The Patient Specific Functional Scale . Retrieved from [email or web address]   COGNITION: Overall cognitive status: Within functional limits for tasks assessed     SENSATION: Chelsea Gray has no complaints of new peripheral pain or paresthesias post surgery  EDEMA:  Noted and not objectively assessed   LOWER EXTREMITY ROM:  Active ROM Left/Right 02/04/2024 Right 02/11/24  Knee flexion 121/119 123  Knee extension 0/-5 -2   (Blank rows = not tested)  STRENGTH:  In pounds with hand-held dynamometer Left/Right   Hip flexion    Hip extension    Hip abduction    Hip adduction    Hip internal rotation    Hip external rotation    Knee flexion    Knee extension 57.4/28.3   Ankle dorsiflexion    Ankle plantarflexion    Ankle inversion     Ankle eversion     (Blank rows = not tested)  GAIT: Distance walked: 100 feet Assistive device utilized: Walker - 2 wheeled Level of assistance: Complete Independence Comments: Deal with walking frequently for exercise before her knee started limiting her.  She would like to return to this.                                                                                                                                TREATMENT DATE: R TKA 02/15/24 ***   02/11/24 TherAct NuStep L4 x 8 min;  LEs only Leg press bil 75# 3x10 RLE only 31# x 10, then 25# 2x10 Seated LAQ with 4# on Rt; 3 sec hold; 2x10 Sit to/from stand 2x10 from elevated mat table (20)    Vaso at 34 degrees medium compression x10 min for edema; Rt knee   02/07/24 Nustep 6 min level 3 Quad sets with towel roll under ankle 3x10 SLR 3x10 Heel slides active 2x10 Seated LAQ 2x10  #2 2x10 Standing mini squats 2x10 Standing heel raises 2x10 Manual PROM and hamstring stretching Vaso at 36 degrees medium compression x10 min for edema   02/04/2024 Seated quadriceps sets with a right heel prop 2 sets of 10 for 5 seconds Seated knee extension stretch with 4 pounds hanging from a strap just above her right kneecap 3 minutes  97535: Discussed examination findings; expectations with physical therapy and her day 1 home exercises   PATIENT EDUCATION:  Education details: See above Person educated: Patient Education method: Explanation, Demonstration, Tactile cues, Verbal cues, and Handouts Education comprehension: verbalized understanding, returned demonstration, verbal cues required, tactile cues required, and needs further education  HOME EXERCISE PROGRAM: Access Code: [email or web address] URL: [email or web address] Date: 02/04/2024 Prepared by: Lamar Ivory  Exercises - Supine Quadricep Sets  - 5 x daily - 7 x weekly - 2 sets - 10 reps - 5 second hold - Seated Passive Knee Extension with Weight  - 2 x daily  - 7 x weekly - 1 sets - 1 reps - 3-5 minutes hold  ASSESSMENT:  CLINICAL IMPRESSION: *** Able to progress strengthening exercises today with good tolerance.  Excellent ROM, will continue to work on balance and strength to maximize function.   OBJECTIVE IMPAIRMENTS: Abnormal gait, cardiopulmonary status limiting activity, decreased activity tolerance, decreased balance, decreased endurance, decreased knowledge of condition, difficulty walking, decreased ROM, decreased strength, increased edema, impaired perceived functional ability, increased muscle spasms, and pain.   ACTIVITY LIMITATIONS: bending, sitting, standing, squatting, sleeping, stairs, and locomotion level  PARTICIPATION LIMITATIONS: meal prep, cleaning, laundry, driving, shopping, and community activity  PERSONAL FACTORS: Type 2 DM, HTN, HLD, hyperthyroid, pulmonary embolism, C-section, Bilateral TKA, COPD, neuropathy are also affecting patient's functional outcome.   REHAB POTENTIAL: Good  CLINICAL DECISION MAKING: Evolving/moderate complexity  EVALUATION COMPLEXITY: Moderate   GOALS: Goals reviewed with patient? Yes  SHORT TERM GOALS: Target date: 02/11/2024 Chelsea Gray will be independent with her day 1 home exercises Baseline: Started 02/04/2024 Goal status: MET 02/07/24  2.  Improve right knee extension active range of motion to within 3 degrees of full extension Baseline: 5 degrees from full extension Goal status: MET 02/11/24  3.  Improve right quadriceps strength to at least 40 pounds Baseline: 28.3 pounds Goal status: Ongoing 02/07/24   LONG TERM GOALS: Target date: 04/28/2024  Improve patient-specific functional score to at least 5 Baseline: 0.33 Goal status: INITIAL  2.  Chelsea Gray will report right knee pain consistently 0-3/10 on the numeric pain rating scale Baseline: 7-10/10 Goal status: INITIAL  3.  Improve right knee active range of motion to at least 0 - 2 - 120 degrees Baseline: 0 - 5 - 119 degrees Goal  status: INITIAL  4.  Improve right quadriceps strength to 50+ pounds Baseline: 28.3 pounds Goal status: INITIAL  5.  Chelsea Gray will be independent with her walking without an assistive device and will be able to walk at least 1/4 mile without increasing right knee symptoms Baseline: Wheeled walker Goal status: INITIAL  6.  Chelsea Gray will be independent with her long-term maintenance  home exercise program at discharge Baseline: Started 02/04/2024 Goal status: INITIAL   PLAN:  PT FREQUENCY: 2x/week  PT DURATION: 12 weeks  PLANNED INTERVENTIONS: 97750- Physical Performance Testing, 97110-Therapeutic exercises, 97530- Therapeutic activity, 97112- Neuromuscular re-education, 97535- Self Care, 02859- Manual therapy, [phone]- Gait training, [phone]- Vasopneumatic device, Patient/Family education, Balance training, Stair training, Joint mobilization, and Cryotherapy  PLAN FOR NEXT SESSION: *** gait without cane, Total knee rehabilitation protocol with emphasis on extension active range of motion, strength, balance and function   Chelsea Gray, PT, DPT 02/15/24 7:31 AM   Date of referral: 02/02/2024 Referring provider: Cordella Glendia Hutchinson, MD Referring diagnosis?  Z96.651 (ICD-10-CM) - S/P total knee arthroplasty, right   Treatment diagnosis? (if different than referring diagnosis) R26.2   M62.81   R60.0   M25.561   M25.661  What was this (referring dx) caused by? Surgery (Type: TKA)  Nature of Condition: Initial Onset (within last 3 months)   Laterality: Rt  Current Functional Measure Score: Patient Specific Functional Scale 0.33  Objective measurements identify impairments when they are compared to normal values, the uninvolved extremity, and prior level of function.  [x]  Yes  []  No  Objective assessment of functional ability: Severe functional limitations   Briefly describe symptoms: Typical post-surgical knee replacement with pain affecting sleep and all functional  activities  How did symptoms start: Osteoarthritis leading to a total knee replacement  Average pain intensity:  Last 24 hours: As high as 10/10  Past week: 7-10/10  How often does the pt experience symptoms? Constantly  How much have the symptoms interfered with usual daily activities? Extremely  How has condition changed since care began at this facility? NA - initial visit  In general, how is the patients overall health? Good   BACK PAIN (STarT Back Screening Tool) No

## 2024-02-18 ENCOUNTER — Ambulatory Visit: Admitting: Rehabilitative and Restorative Service Providers"

## 2024-02-18 ENCOUNTER — Encounter: Payer: Self-pay | Admitting: Rehabilitative and Restorative Service Providers"

## 2024-02-18 DIAGNOSIS — R600 Localized edema: Secondary | ICD-10-CM

## 2024-02-18 DIAGNOSIS — R262 Difficulty in walking, not elsewhere classified: Secondary | ICD-10-CM

## 2024-02-18 DIAGNOSIS — M6281 Muscle weakness (generalized): Secondary | ICD-10-CM

## 2024-02-18 DIAGNOSIS — M25561 Pain in right knee: Secondary | ICD-10-CM | POA: Diagnosis not present

## 2024-02-18 DIAGNOSIS — M25661 Stiffness of right knee, not elsewhere classified: Secondary | ICD-10-CM

## 2024-02-18 NOTE — Therapy (Signed)
 OUTPATIENT PHYSICAL THERAPY LOWER EXTREMITY TREATMENT   Patient Name: Chelsea Gray MRN: [id number] DOB:1956-03-02, 68 y.o., female Today's Date: 02/18/2024  END OF SESSION:  PT End of Session - 02/18/24 1347     Visit Number 4    Number of Visits 24    Date for Recertification  04/28/24    Authorization Type [employer] Medicare    PT Start Time 1345    PT Stop Time 1435    PT Time Calculation (min) 50 min    Activity Tolerance Patient tolerated treatment well;No increased pain    Behavior During Therapy [facility] for tasks assessed/performed             Past Medical History:  Diagnosis Date   Anemia    tx w/iron   Blood transfusion    d/t hemmorrhage post c- section   DEPRESSION 01/24/2009   DM 01/24/2009   type 2   H/O blood transfusion reaction 09/1992   cause PE and hemolytic type reaction per pt   History of one miscarriage    HYPERCHOLESTEROLEMIA 01/24/2009   HYPERTENSION 01/24/2009   Hyperthyroidism    per pt on 01/13/24   Osteoarthritis    Pulmonary embolism (HCC)    result of blood transfusion reaction 18 years ago after childbirth   Past Surgical History:  Procedure Laterality Date   BIOPSY  10/09/2019   Procedure: BIOPSY;  Surgeon: Saintclair Jasper, MD;  Location: [facility] ENDOSCOPY;  Service: Gastroenterology;;   BREAST CYST EXCISION     BREAST REDUCTION SURGERY     CERVICAL CERCLAGE     CESAREAN SECTION     COLONOSCOPY WITH PROPOFOL  N/A 10/09/2019   Procedure: COLONOSCOPY WITH PROPOFOL ;  Surgeon: Saintclair Jasper, MD;  Location: [facility] ENDOSCOPY;  Service: Gastroenterology;  Laterality: N/A;   ESOPHAGOGASTRODUODENOSCOPY (EGD) WITH PROPOFOL  N/A 10/09/2019   Procedure: ESOPHAGOGASTRODUODENOSCOPY (EGD) WITH PROPOFOL ;  Surgeon: Saintclair Jasper, MD;  Location: [facility] ENDOSCOPY;  Service: Gastroenterology;  Laterality: N/A;   HEMOSTASIS CLIP PLACEMENT  10/09/2019   Procedure: HEMOSTASIS CLIP PLACEMENT;  Surgeon: Saintclair Jasper, MD;  Location: [facility] ENDOSCOPY;  Service: Gastroenterology;;   HOT  HEMOSTASIS N/A 10/09/2019   Procedure: HOT HEMOSTASIS (ARGON PLASMA COAGULATION/BICAP);  Surgeon: Saintclair Jasper, MD;  Location: [facility] ENDOSCOPY;  Service: Gastroenterology;  Laterality: N/A;   JOINT REPLACEMENT     KNEE ARTHROPLASTY  07/14/2011   Procedure: COMPUTER ASSISTED TOTAL LEFT  KNEE ARTHROPLASTY;  Surgeon: Cordella Glendia Hutchinson, MD;  Location: [facility] OR;  Service: Orthopedics;  Laterality: Left;  Left total knee arthroplasty   POLYPECTOMY  10/09/2019   Procedure: POLYPECTOMY;  Surgeon: Saintclair Jasper, MD;  Location: [facility] ENDOSCOPY;  Service: Gastroenterology;;   REDUCTION MAMMAPLASTY Bilateral    TOTAL KNEE ARTHROPLASTY Right 01/18/2024   Procedure: RIGHT TOTAL KNEE ARTHROPLASTY;  Surgeon: Hutchinson Cordella Glendia, MD;  Location: [facility] OR;  Service: Orthopedics;  Laterality: Right;   Patient Active Problem List   Diagnosis Date Noted   Arthritis of right knee 01/29/2024   S/P total knee replacement, right 01/18/2024   Hypokalemia 11/01/2023   High anion gap metabolic acidosis 11/01/2023   Pulmonary nodules 11/01/2023   History of COPD 11/01/2023   Hyperthyroidism 11/07/2019   Hyperlipidemia 11/07/2019   Chest pain 10/08/2019   Chest pain with moderate risk for cardiac etiology 10/07/2019   Abdominal pain 12/24/2010   MYALGIA 06/19/2009   NUMBNESS 06/19/2009   DM2 (diabetes mellitus, type 2) (HCC) 01/24/2009   HYPERCHOLESTEROLEMIA 01/24/2009   SMOKER 01/24/2009   DEPRESSION 01/24/2009   Essential  hypertension 01/24/2009   CHEST PAIN 01/24/2009   Neuropathy 01/24/2009   Iron deficiency anemia due to chronic blood loss 01/24/2009    PCP: Rinka R. Vernon, MD  REFERRING PROVIDER: Cordella Glendia Hutchinson, MD  REFERRING DIAG:  [phone] (ICD-10-CM) - S/P total knee arthroplasty, right    THERAPY DIAG:  Difficulty in walking, not elsewhere classified  Muscle weakness (generalized)  Localized edema  Acute pain of right knee  Stiffness of right knee, not elsewhere classified  Rationale for  Evaluation and Treatment: Rehabilitation  ONSET DATE: 01/18/2024  SUBJECTIVE:   SUBJECTIVE STATEMENT: [name] reports perfect sleep last night.  She has started to wean from hydrocodone  to tylenol .  She is not using an assistive device in the house.  PERTINENT HISTORY: Type 2 DM, HTN, HLD, hyperthyroid, pulmonary embolism, C-section, Bilateral TKA, COPD, neuropathy   PAIN:  Are you having pain? Yes: NPRS scale: 2-5/10 this week  Pain location: Rt knee Pain description: Throbbing like a tooth ache Aggravating factors: Prolonged postures and over weight-bearing Relieving factors: Hydrocodone , tylenol  and rare muscle relaxers (none in over a week)  PRECAUTIONS: None  RED FLAGS: None   WEIGHT BEARING RESTRICTIONS: No  FALLS:  Has patient fallen in last 6 months? No  LIVING ENVIRONMENT: Lives with: lives with their family and lives with their spouse Lives in: House/apartment Stairs: Manages but not easy Has following equipment at home: Environmental Consultant - 2 wheeled  OCCUPATION: Retired  PLOF: Independent  PATIENT GOALS: Return to daily walks of 45-60 minutes and normal ADLs  NEXT MD VISIT: 03/01/2024  OBJECTIVE:  Note: Objective measures were completed at Evaluation unless otherwise noted.  DIAGNOSTIC FINDINGS: See chart  PATIENT SURVEYS:  PSFS: THE PATIENT SPECIFIC FUNCTIONAL SCALE  Place score of 0-10 (0 = unable to perform activity and 10 = able to perform activity at the same level as before injury or problem)  Activity Date: 02/04/2024 02/18/2024   Walking for exercise 0 4   2.  Cleaning 0 6   3.  Sleeping 1 5   4.      Total Score 0.33 5     Total Score = Sum of activity scores/number of activities  Minimally Detectable Change: 3 points (for single activity); 2 points (for average score)  [facility] (nd). The Patient Specific Functional Scale . Retrieved from [email or web address].[email or web address]    COGNITION: Overall cognitive status: Within functional limits for tasks assessed     SENSATION: Cheryl has no complaints of new peripheral pain or paresthesias post surgery  EDEMA:  Noted and not objectively assessed   LOWER EXTREMITY ROM:  Active ROM Left/Right 02/04/2024 Right 02/11/24  Knee flexion 121/119 123  Knee extension 0/-5 -2   (Blank rows = not tested)  STRENGTH:  In pounds with hand-held dynamometer Left/Right Left/Right 02/18/2024  Hip flexion    Hip extension    Hip abduction    Hip adduction    Hip internal rotation    Hip external rotation    Knee flexion    Knee extension 57.4/28.3 53.4/30.6  Ankle dorsiflexion    Ankle plantarflexion    Ankle inversion    Ankle eversion     (Blank rows = not tested)  GAIT: Distance walked: 100 feet Assistive device utilized: Walker - 2 wheeled Level of assistance: Complete Independence Comments: Deal with walking frequently for exercise before her knee started limiting her.  She would like to return to this.  TREATMENT DATE: R TKA 02/18/2024 Recumbent bike Seat 7 for 5.5 minutes, Resistance Level 5 Seated straight leg raises 10 x each side   Functional Activities: Double Leg Press 75# full extension and slow eccentrics 15 reps Single leg Press 31# bilaterally, full extension and slow eccentrics 10 reps Step-down off 2 and 4 inch step, slow eccentrics 10 x each side  Neuromuscular re-education: Tandem balance: eyes open 6 x 20 seconds and head turning 4 x 20 seconds Slow marching 12 x 3 seconds  Vaso at 34 degrees medium compression x 10 min for edema; Rt knee   02/11/24 TherAct NuStep L4 x 8 min; LEs only Leg press bil 75# 3x10 RLE only 31# x 10, then 25# 2x10 Seated LAQ with 4# on Rt; 3 sec hold; 2x10 Sit to/from stand 2x10 from elevated mat table (20)  Vaso at 34 degrees  medium compression x10 min for edema; Rt knee   02/07/24 Nustep 6 min level 3 Quad sets with towel roll under ankle 3x10 SLR 3x10 Heel slides active 2x10 Seated LAQ 2x10  #2 2x10 Standing mini squats 2x10 Standing heel raises 2x10 Manual PROM and hamstring stretching Vaso at 36 degrees medium compression x10 min for edema   PATIENT EDUCATION:  Education details: See above Person educated: Patient Education method: Explanation, Demonstration, Tactile cues, Verbal cues, and Handouts Education comprehension: verbalized understanding, returned demonstration, verbal cues required, tactile cues required, and needs further education  HOME EXERCISE PROGRAM: Access Code: [email or web address] URL: [email or web address] Date: 02/18/2024 Prepared by: Lamar Ivory  Exercises - Tandem Stance  - 1-2 x daily - 7 x weekly - 1 sets - 10 reps - 20 second hold - Seated Straight Leg Raise   - 1 x daily - 7 x weekly - 3-5 sets - 10 reps - 2 seconds hold  ASSESSMENT:  CLINICAL IMPRESSION: Sharunda is in excellent shape for 1 month post total knee replacement.  Active range of motion is outstanding and her quadriceps strength is making objective progress.  Elena will continue to benefit from quadriceps strengthening, balance and functional activities to wean her from her assistive device and prepare her for independent rehabilitation.  OBJECTIVE IMPAIRMENTS: Abnormal gait, cardiopulmonary status limiting activity, decreased activity tolerance, decreased balance, decreased endurance, decreased knowledge of condition, difficulty walking, decreased ROM, decreased strength, increased edema, impaired perceived functional ability, increased muscle spasms, and pain.   ACTIVITY LIMITATIONS: bending, sitting, standing, squatting, sleeping, stairs, and locomotion level  PARTICIPATION LIMITATIONS: meal prep, cleaning, laundry, driving, shopping, and community activity  PERSONAL FACTORS: Type 2 DM, HTN, HLD,  hyperthyroid, pulmonary embolism, C-section, Bilateral TKA, COPD, neuropathy are also affecting patient's functional outcome.   REHAB POTENTIAL: Good  CLINICAL DECISION MAKING: Evolving/moderate complexity  EVALUATION COMPLEXITY: Moderate   GOALS: Goals reviewed with patient? Yes  SHORT TERM GOALS: Target date: 02/11/2024 Zoriah will be independent with her day 1 home exercises Baseline: Started 02/04/2024 Goal status: MET 02/07/24  2.  Improve right knee extension active range of motion to within 3 degrees of full extension Baseline: 5 degrees from full extension Goal status: MET 02/11/24  3.  Improve right quadriceps strength to at least 40 pounds Baseline: 28.3 pounds Goal status: Ongoing 02/18/24   LONG TERM GOALS: Target date: 04/28/2024  Improve patient-specific functional score to at least 5 Baseline: 0.33 Goal status: Met 02/18/2024  2.  Stephaniemarie will report right knee pain consistently 0-3/10 on the numeric pain rating scale Baseline: 7-10/10 Goal status: Ongoing 02/18/2024  3.  Improve right knee  active range of motion to at least 0 - 2 - 120 degrees Baseline: 0 - 5 - 119 degrees Goal status: Met 02/18/2024  4.  Improve right quadriceps strength to 50+ pounds Baseline: 28.3 pounds Goal status: Ongoing 02/18/2024  5.  Adrianna will be independent with her walking without an assistive device and will be able to walk at least 1/4 mile without increasing right knee symptoms Baseline: Wheeled walker Goal status: Ongoing 02/18/2024  6.  Searra will be independent with her long-term maintenance home exercise program at discharge Baseline: Started 02/04/2024 Goal status: INITIAL   PLAN:  PT FREQUENCY: 2x/week  PT DURATION: 12 weeks  PLANNED INTERVENTIONS: 97750- Physical Performance Testing, 97110-Therapeutic exercises, 97530- Therapeutic activity, 97112- Neuromuscular re-education, 97535- Self Care, 02859- Manual therapy, [phone]- Gait training, [phone]- Vasopneumatic  device, Patient/Family education, Balance training, Stair training, Joint mobilization, and Cryotherapy  PLAN FOR NEXT SESSION: Gait, balance drills without a cane. Total knee rehabilitation protocol with emphasis on strength, balance and function.   Myer LELON Ivory, PT, MPT 02/18/24 2:43 PM   Date of referral: 02/02/2024 Referring provider: Cordella Glendia Hutchinson, MD Referring diagnosis?  Z96.651 (ICD-10-CM) - S/P total knee arthroplasty, right   Treatment diagnosis? (if different than referring diagnosis) R26.2   M62.81   R60.0   M25.561   M25.661  What was this (referring dx) caused by? Surgery (Type: TKA)  Nature of Condition: Initial Onset (within last 3 months)   Laterality: Rt  Current Functional Measure Score: Patient Specific Functional Scale 0.33  Objective measurements identify impairments when they are compared to normal values, the uninvolved extremity, and prior level of function.  [x]  Yes  []  No  Objective assessment of functional ability: Severe functional limitations   Briefly describe symptoms: Typical post-surgical knee replacement with pain affecting sleep and all functional activities  How did symptoms start: Osteoarthritis leading to a total knee replacement  Average pain intensity:  Last 24 hours: As high as 10/10  Past week: 7-10/10  How often does the pt experience symptoms? Constantly  How much have the symptoms interfered with usual daily activities? Extremely  How has condition changed since care began at this facility? NA - initial visit  In general, how is the patients overall health? Good   BACK PAIN (STarT Back Screening Tool) No

## 2024-02-22 ENCOUNTER — Ambulatory Visit: Admitting: Rehabilitative and Restorative Service Providers"

## 2024-02-22 ENCOUNTER — Encounter: Payer: Self-pay | Admitting: Rehabilitative and Restorative Service Providers"

## 2024-02-22 DIAGNOSIS — M6281 Muscle weakness (generalized): Secondary | ICD-10-CM

## 2024-02-22 DIAGNOSIS — M25561 Pain in right knee: Secondary | ICD-10-CM

## 2024-02-22 DIAGNOSIS — R600 Localized edema: Secondary | ICD-10-CM | POA: Diagnosis not present

## 2024-02-22 DIAGNOSIS — R262 Difficulty in walking, not elsewhere classified: Secondary | ICD-10-CM

## 2024-02-22 DIAGNOSIS — M25661 Stiffness of right knee, not elsewhere classified: Secondary | ICD-10-CM

## 2024-02-22 NOTE — Therapy (Signed)
 OUTPATIENT PHYSICAL THERAPY LOWER EXTREMITY TREATMENT   Patient Name: Chelsea Gray MRN: [id number] DOB:1955-04-19, 68 y.o., female Today's Date: 02/22/2024  END OF SESSION:  PT End of Session - 02/22/24 1006     Visit Number 5    Number of Visits 24    Date for Recertification  04/28/24    Authorization Type [employer] Medicare    PT Start Time 1006    PT Stop Time 1059    PT Time Calculation (min) 53 min    Activity Tolerance Patient tolerated treatment well;No increased pain    Behavior During Therapy [facility] for tasks assessed/performed              Past Medical History:  Diagnosis Date   Anemia    tx w/iron   Blood transfusion    d/t hemmorrhage post c- section   DEPRESSION 01/24/2009   DM 01/24/2009   type 2   H/O blood transfusion reaction 09/1992   cause PE and hemolytic type reaction per pt   History of one miscarriage    HYPERCHOLESTEROLEMIA 01/24/2009   HYPERTENSION 01/24/2009   Hyperthyroidism    per pt on 01/13/24   Osteoarthritis    Pulmonary embolism (HCC)    result of blood transfusion reaction 18 years ago after childbirth   Past Surgical History:  Procedure Laterality Date   BIOPSY  10/09/2019   Procedure: BIOPSY;  Surgeon: Saintclair Jasper, MD;  Location: [facility] ENDOSCOPY;  Service: Gastroenterology;;   BREAST CYST EXCISION     BREAST REDUCTION SURGERY     CERVICAL CERCLAGE     CESAREAN SECTION     COLONOSCOPY WITH PROPOFOL  N/A 10/09/2019   Procedure: COLONOSCOPY WITH PROPOFOL ;  Surgeon: Saintclair Jasper, MD;  Location: [facility] ENDOSCOPY;  Service: Gastroenterology;  Laterality: N/A;   ESOPHAGOGASTRODUODENOSCOPY (EGD) WITH PROPOFOL  N/A 10/09/2019   Procedure: ESOPHAGOGASTRODUODENOSCOPY (EGD) WITH PROPOFOL ;  Surgeon: Saintclair Jasper, MD;  Location: [facility] ENDOSCOPY;  Service: Gastroenterology;  Laterality: N/A;   HEMOSTASIS CLIP PLACEMENT  10/09/2019   Procedure: HEMOSTASIS CLIP PLACEMENT;  Surgeon: Saintclair Jasper, MD;  Location: [facility] ENDOSCOPY;  Service: Gastroenterology;;    HOT HEMOSTASIS N/A 10/09/2019   Procedure: HOT HEMOSTASIS (ARGON PLASMA COAGULATION/BICAP);  Surgeon: Saintclair Jasper, MD;  Location: [facility] ENDOSCOPY;  Service: Gastroenterology;  Laterality: N/A;   JOINT REPLACEMENT     KNEE ARTHROPLASTY  07/14/2011   Procedure: COMPUTER ASSISTED TOTAL LEFT  KNEE ARTHROPLASTY;  Surgeon: Cordella Glendia Hutchinson, MD;  Location: [facility] OR;  Service: Orthopedics;  Laterality: Left;  Left total knee arthroplasty   POLYPECTOMY  10/09/2019   Procedure: POLYPECTOMY;  Surgeon: Saintclair Jasper, MD;  Location: [facility] ENDOSCOPY;  Service: Gastroenterology;;   REDUCTION MAMMAPLASTY Bilateral    TOTAL KNEE ARTHROPLASTY Right 01/18/2024   Procedure: RIGHT TOTAL KNEE ARTHROPLASTY;  Surgeon: Hutchinson Cordella Glendia, MD;  Location: [facility] OR;  Service: Orthopedics;  Laterality: Right;   Patient Active Problem List   Diagnosis Date Noted   Arthritis of right knee 01/29/2024   S/P total knee replacement, right 01/18/2024   Hypokalemia 11/01/2023   High anion gap metabolic acidosis 11/01/2023   Pulmonary nodules 11/01/2023   History of COPD 11/01/2023   Hyperthyroidism 11/07/2019   Hyperlipidemia 11/07/2019   Chest pain 10/08/2019   Chest pain with moderate risk for cardiac etiology 10/07/2019   Abdominal pain 12/24/2010   MYALGIA 06/19/2009   NUMBNESS 06/19/2009   DM2 (diabetes mellitus, type 2) (HCC) 01/24/2009   HYPERCHOLESTEROLEMIA 01/24/2009   SMOKER 01/24/2009   DEPRESSION 01/24/2009  Essential hypertension 01/24/2009   CHEST PAIN 01/24/2009   Neuropathy 01/24/2009   Iron deficiency anemia due to chronic blood loss 01/24/2009    PCP: Rinka R. Vernon, MD  REFERRING PROVIDER: Cordella Glendia Hutchinson, MD  REFERRING DIAG:  [phone] (ICD-10-CM) - S/P total knee arthroplasty, right    THERAPY DIAG:  Difficulty in walking, not elsewhere classified  Muscle weakness (generalized)  Localized edema  Acute pain of right knee  Stiffness of right knee, not elsewhere classified  Rationale for  Evaluation and Treatment: Rehabilitation  ONSET DATE: 01/18/2024  SUBJECTIVE:   SUBJECTIVE STATEMENT: Chelsea Gray reports continued progress with her sleep and she has not taken a hydrocodone  since her last PT visit.  Tylenol  only.  She is not using an assistive device in the house.  PERTINENT HISTORY: Type 2 DM, HTN, HLD, hyperthyroid, pulmonary embolism, C-section, Bilateral TKA, COPD, neuropathy   PAIN:  Are you having pain? Yes: NPRS scale: 0-4/10 this week  Pain location: Rt knee Pain description: Throbbing like a tooth ache Aggravating factors: Prolonged postures and over weight-bearing Relieving factors: Tylenol  and ice (no prescription meds in over a week)  PRECAUTIONS: None  RED FLAGS: None   WEIGHT BEARING RESTRICTIONS: No  FALLS:  Has patient fallen in last 6 months? No  LIVING ENVIRONMENT: Lives with: lives with their family and lives with their spouse Lives in: House/apartment Stairs: Manages but not easy Has following equipment at home: Environmental Consultant - 2 wheeled  OCCUPATION: Retired  PLOF: Independent  PATIENT GOALS: Return to daily walks of 45-60 minutes and normal ADLs  NEXT MD VISIT: 03/01/2024  OBJECTIVE:  Note: Objective measures were completed at Evaluation unless otherwise noted.  DIAGNOSTIC FINDINGS: See chart  PATIENT SURVEYS:  PSFS: THE PATIENT SPECIFIC FUNCTIONAL SCALE  Place score of 0-10 (0 = unable to perform activity and 10 = able to perform activity at the same level as before injury or problem)  Activity Date: 02/04/2024 02/18/2024   Walking for exercise 0 4   2.  Cleaning 0 6   3.  Sleeping 1 5   4.      Total Score 0.33 5     Total Score = Sum of activity scores/number of activities  Minimally Detectable Change: 3 points (for single activity); 2 points (for average score)  [facility] (nd). The Patient Specific Functional Scale . Retrieved from [email or web address]    COGNITION: Overall cognitive status: Within functional limits for tasks assessed     SENSATION: Cheryl has no complaints of new peripheral pain or paresthesias post surgery  EDEMA:  Noted and not objectively assessed   LOWER EXTREMITY ROM:  Active ROM Left/Right 02/04/2024 Right 02/11/24  Knee flexion 121/119 123  Knee extension 0/-5 -2   (Blank rows = not tested)  STRENGTH:  In pounds with hand-held dynamometer Left/Right Left/Right 02/18/2024  Hip flexion    Hip extension    Hip abduction    Hip adduction    Hip internal rotation    Hip external rotation    Knee flexion    Knee extension 57.4/28.3 53.4/30.6  Ankle dorsiflexion    Ankle plantarflexion    Ankle inversion    Ankle eversion     (Blank rows = not tested)  GAIT: Distance walked: 100 feet Assistive device utilized: Walker - 2 wheeled Level of assistance: Complete Independence Comments: Deal with walking frequently for exercise before her knee started limiting her.  She would like to return to this.  TREATMENT DATE: R TKA 02/22/2024 Recumbent bike Seat 7 for 5 minutes, Resistance Level 5, :45 at an 8+ MPH pace and sprint the last :15 of each minute (13+ MPH) Seated straight leg raises 2 sets of 10 x each side with 1#  Functional Activities: Double Leg Press 87# full extension and slow eccentrics 15 reps Single leg Press 37# bilaterally, full extension and slow eccentrics 10 reps Sit to stand with slow eccentrics 2 sets of 5 reps  Neuromuscular re-education: Tandem balance: eyes open 2 x 20 seconds; head turning 4 x 20 seconds and eyes closed 4 x 20 seconds Single leg stance 10 x 10 seconds Slow marching 12 x 3 seconds  Vaso at 34 degrees High compression x 10 min for edema; Rt knee    02/18/2024 Recumbent bike Seat 7 for 5.5 minutes, Resistance Level 5 Seated straight  leg raises 10 x each side   Functional Activities: Double Leg Press 75# full extension and slow eccentrics 15 reps Single leg Press 31# bilaterally, full extension and slow eccentrics 10 reps Step-down off 2 and 4 inch step, slow eccentrics 10 x each side  Neuromuscular re-education: Tandem balance: eyes open 6 x 20 seconds and head turning 4 x 20 seconds Slow marching 12 x 3 seconds  Vaso at 34 degrees medium compression x 10 min for edema; Rt knee   02/11/24 TherAct NuStep L4 x 8 min; LEs only Leg press bil 75# 3x10 RLE only 31# x 10, then 25# 2x10 Seated LAQ with 4# on Rt; 3 sec hold; 2x10 Sit to/from stand 2x10 from elevated mat table (20)  Vaso at 34 degrees medium compression x10 min for edema; Rt knee   PATIENT EDUCATION:  Education details: See above Person educated: Patient Education method: Explanation, Demonstration, Tactile cues, Verbal cues, and Handouts Education comprehension: verbalized understanding, returned demonstration, verbal cues required, tactile cues required, and needs further education  HOME EXERCISE PROGRAM: Access Code: [email or web address] URL: [email or web address] Date: 02/22/2024 Prepared by: Lamar Ivory  Exercises - Tandem Stance  - 1-2 x daily - 7 x weekly - 1 sets - 10 reps - 20 second hold - Seated Straight Leg Raise   - 1 x daily - 7 x weekly - 3-5 sets - 10 reps - 2 seconds hold - Single Leg Stance  - 1 x daily - 7 x weekly - 1-2 sets - 10 reps - 10 seconds hold  ASSESSMENT:  CLINICAL IMPRESSION: Havyn continues to do very well just over 1 month post total knee replacement.  Quadriceps strength, balance, gait without an assistive device and functional progressions are the focus of her current program.  Lindsy will continue to benefit from supervised PT in preparation for transfer independent rehabilitation.  OBJECTIVE IMPAIRMENTS: Abnormal gait, cardiopulmonary status limiting activity, decreased activity tolerance, decreased  balance, decreased endurance, decreased knowledge of condition, difficulty walking, decreased ROM, decreased strength, increased edema, impaired perceived functional ability, increased muscle spasms, and pain.   ACTIVITY LIMITATIONS: bending, sitting, standing, squatting, sleeping, stairs, and locomotion level  PARTICIPATION LIMITATIONS: meal prep, cleaning, laundry, driving, shopping, and community activity  PERSONAL FACTORS: Type 2 DM, HTN, HLD, hyperthyroid, pulmonary embolism, C-section, Bilateral TKA, COPD, neuropathy are also affecting patient's functional outcome.   REHAB POTENTIAL: Good  CLINICAL DECISION MAKING: Evolving/moderate complexity  EVALUATION COMPLEXITY: Moderate   GOALS: Goals reviewed with patient? Yes  SHORT TERM GOALS: Target date: 02/11/2024 Latasia will be independent with her day 1 home exercises Baseline: Started 02/04/2024 Goal status: MET 02/07/24  2.  Improve right knee extension active range of motion to within 3 degrees of full extension Baseline: 5 degrees from full extension Goal status: MET 02/11/24  3.  Improve right quadriceps strength to at least 40 pounds Baseline: 28.3 pounds Goal status: Ongoing 02/18/24   LONG TERM GOALS: Target date: 04/28/2024  Improve patient-specific functional score to at least 5 Baseline: 0.33 Goal status: Met 02/18/2024  2.  Irving will report right knee pain consistently 0-3/10 on the numeric pain rating scale Baseline: 7-10/10 Goal status: Partially Met 02/22/2024  3.  Improve right knee active range of motion to at least 0 - 2 - 120 degrees Baseline: 0 - 5 - 119 degrees Goal status: Met 02/18/2024  4.  Improve right quadriceps strength to 50+ pounds Baseline: 28.3 pounds Goal status: Ongoing 02/18/2024  5.  Talisa will be independent with her walking without an assistive device and will be able to walk at least 1/4 mile without increasing right knee symptoms Baseline: Wheeled walker Goal status: Ongoing  02/22/2024  6.  Rachelle will be independent with her long-term maintenance home exercise program at discharge Baseline: Started 02/04/2024 Goal status: INITIAL   PLAN:  PT FREQUENCY: 2x/week  PT DURATION: 12 weeks  PLANNED INTERVENTIONS: 97750- Physical Performance Testing, 97110-Therapeutic exercises, 97530- Therapeutic activity, 97112- Neuromuscular re-education, 97535- Self Care, 02859- Manual therapy, [phone]- Gait training, [phone]- Vasopneumatic device, Patient/Family education, Balance training, Stair training, Joint mobilization, and Cryotherapy  PLAN FOR NEXT SESSION: Gait, balance drills without a cane. Total knee rehabilitation protocol with emphasis on strength, balance and function.   Myer LELON Ivory, PT, MPT 02/22/24 10:55 AM   Date of referral: 02/02/2024 Referring provider: Cordella Glendia Hutchinson, MD Referring diagnosis?  Z96.651 (ICD-10-CM) - S/P total knee arthroplasty, right   Treatment diagnosis? (if different than referring diagnosis) R26.2   M62.81   R60.0   M25.561   M25.661  What was this (referring dx) caused by? Surgery (Type: TKA)  Nature of Condition: Initial Onset (within last 3 months)   Laterality: Rt  Current Functional Measure Score: Patient Specific Functional Scale 0.33  Objective measurements identify impairments when they are compared to normal values, the uninvolved extremity, and prior level of function.  [x]  Yes  []  No  Objective assessment of functional ability: Severe functional limitations   Briefly describe symptoms: Typical post-surgical knee replacement with pain affecting sleep and all functional activities  How did symptoms start: Osteoarthritis leading to a total knee replacement  Average pain intensity:  Last 24 hours: As high as 10/10  Past week: 7-10/10  How often does the pt experience symptoms? Constantly  How much have the symptoms interfered with usual daily activities? Extremely  How has condition changed since care  began at this facility? NA - initial visit  In general, how is the patients overall health? Good   BACK PAIN (STarT Back Screening Tool) No

## 2024-02-23 ENCOUNTER — Encounter: Payer: Self-pay | Admitting: Orthopedic Surgery

## 2024-02-23 ENCOUNTER — Other Ambulatory Visit: Payer: Self-pay | Admitting: Orthopedic Surgery

## 2024-02-23 MED ORDER — HYDROCODONE-ACETAMINOPHEN 5-325 MG PO TABS: 1.0000 | ORAL_TABLET | Freq: Four times a day (QID) | ORAL | 0 refills | Status: AC | PRN

## 2024-02-23 NOTE — Telephone Encounter (Signed)
 Sent thx

## 2024-02-25 ENCOUNTER — Telehealth: Payer: Self-pay | Admitting: Rehabilitative and Restorative Service Providers"

## 2024-02-25 ENCOUNTER — Encounter: Admitting: Rehabilitative and Restorative Service Providers"

## 2024-02-25 NOTE — Telephone Encounter (Signed)
 Left a voicemail today as a result of a no show today at 11:00.  Reminded her of her next appointment Monday and left the [phone] number to call and cancel/reschedule if she is unable to attend 02/25/2024.

## 2024-02-28 ENCOUNTER — Encounter: Payer: Self-pay | Admitting: Physical Therapy

## 2024-02-28 ENCOUNTER — Ambulatory Visit (INDEPENDENT_AMBULATORY_CARE_PROVIDER_SITE_OTHER): Admitting: Physical Therapy

## 2024-02-28 DIAGNOSIS — M25561 Pain in right knee: Secondary | ICD-10-CM | POA: Diagnosis not present

## 2024-02-28 DIAGNOSIS — R600 Localized edema: Secondary | ICD-10-CM

## 2024-02-28 DIAGNOSIS — M25661 Stiffness of right knee, not elsewhere classified: Secondary | ICD-10-CM

## 2024-02-28 DIAGNOSIS — R262 Difficulty in walking, not elsewhere classified: Secondary | ICD-10-CM | POA: Diagnosis not present

## 2024-02-28 DIAGNOSIS — M6281 Muscle weakness (generalized): Secondary | ICD-10-CM

## 2024-02-28 NOTE — Therapy (Signed)
 OUTPATIENT PHYSICAL THERAPY LOWER EXTREMITY TREATMENT   Patient Name: Chelsea Gray MRN: [id number] DOB:05/26/1955, 68 y.o., female Today's Date: 02/28/2024  END OF SESSION:  PT End of Session - 02/28/24 1020     Visit Number 6    Number of Visits 24    Date for Recertification  04/28/24    Authorization Type [employer] Medicare    Progress Note Due on Visit 10    PT Start Time 1015    PT Stop Time 1103    PT Time Calculation (min) 48 min    Activity Tolerance Patient tolerated treatment well;No increased pain    Behavior During Therapy [facility] for tasks assessed/performed               Past Medical History:  Diagnosis Date   Anemia    tx w/iron   Blood transfusion    d/t hemmorrhage post c- section   DEPRESSION 01/24/2009   DM 01/24/2009   type 2   H/O blood transfusion reaction 09/1992   cause PE and hemolytic type reaction per pt   History of one miscarriage    HYPERCHOLESTEROLEMIA 01/24/2009   HYPERTENSION 01/24/2009   Hyperthyroidism    per pt on 01/13/24   Osteoarthritis    Pulmonary embolism (HCC)    result of blood transfusion reaction 18 years ago after childbirth   Past Surgical History:  Procedure Laterality Date   BIOPSY  10/09/2019   Procedure: BIOPSY;  Surgeon: Saintclair Jasper, MD;  Location: [facility] ENDOSCOPY;  Service: Gastroenterology;;   BREAST CYST EXCISION     BREAST REDUCTION SURGERY     CERVICAL CERCLAGE     CESAREAN SECTION     COLONOSCOPY WITH PROPOFOL  N/A 10/09/2019   Procedure: COLONOSCOPY WITH PROPOFOL ;  Surgeon: Saintclair Jasper, MD;  Location: [facility] ENDOSCOPY;  Service: Gastroenterology;  Laterality: N/A;   ESOPHAGOGASTRODUODENOSCOPY (EGD) WITH PROPOFOL  N/A 10/09/2019   Procedure: ESOPHAGOGASTRODUODENOSCOPY (EGD) WITH PROPOFOL ;  Surgeon: Saintclair Jasper, MD;  Location: [facility] ENDOSCOPY;  Service: Gastroenterology;  Laterality: N/A;   HEMOSTASIS CLIP PLACEMENT  10/09/2019   Procedure: HEMOSTASIS CLIP PLACEMENT;  Surgeon: Saintclair Jasper, MD;  Location: [facility]  ENDOSCOPY;  Service: Gastroenterology;;   HOT HEMOSTASIS N/A 10/09/2019   Procedure: HOT HEMOSTASIS (ARGON PLASMA COAGULATION/BICAP);  Surgeon: Saintclair Jasper, MD;  Location: [facility] ENDOSCOPY;  Service: Gastroenterology;  Laterality: N/A;   JOINT REPLACEMENT     KNEE ARTHROPLASTY  07/14/2011   Procedure: COMPUTER ASSISTED TOTAL LEFT  KNEE ARTHROPLASTY;  Surgeon: Cordella Glendia Hutchinson, MD;  Location: [facility] OR;  Service: Orthopedics;  Laterality: Left;  Left total knee arthroplasty   POLYPECTOMY  10/09/2019   Procedure: POLYPECTOMY;  Surgeon: Saintclair Jasper, MD;  Location: [facility] ENDOSCOPY;  Service: Gastroenterology;;   REDUCTION MAMMAPLASTY Bilateral    TOTAL KNEE ARTHROPLASTY Right 01/18/2024   Procedure: RIGHT TOTAL KNEE ARTHROPLASTY;  Surgeon: Hutchinson Cordella Glendia, MD;  Location: [facility] OR;  Service: Orthopedics;  Laterality: Right;   Patient Active Problem List   Diagnosis Date Noted   Arthritis of right knee 01/29/2024   S/P total knee replacement, right 01/18/2024   Hypokalemia 11/01/2023   High anion gap metabolic acidosis 11/01/2023   Pulmonary nodules 11/01/2023   History of COPD 11/01/2023   Hyperthyroidism 11/07/2019   Hyperlipidemia 11/07/2019   Chest pain 10/08/2019   Chest pain with moderate risk for cardiac etiology 10/07/2019   Abdominal pain 12/24/2010   MYALGIA 06/19/2009   NUMBNESS 06/19/2009   DM2 (diabetes mellitus, type 2) (HCC) 01/24/2009   HYPERCHOLESTEROLEMIA 01/24/2009  SMOKER 01/24/2009   DEPRESSION 01/24/2009   Essential hypertension 01/24/2009   CHEST PAIN 01/24/2009   Neuropathy 01/24/2009   Iron deficiency anemia due to chronic blood loss 01/24/2009    PCP: Rinka R. Vernon, MD  REFERRING PROVIDER: Cordella Glendia Hutchinson, MD  REFERRING DIAG:  [phone] (ICD-10-CM) - S/P total knee arthroplasty, right    THERAPY DIAG:  Difficulty in walking, not elsewhere classified  Muscle weakness (generalized)  Localized edema  Acute pain of right knee  Stiffness of right  knee, not elsewhere classified  Rationale for Evaluation and Treatment: Rehabilitation  ONSET DATE: 01/18/2024  SUBJECTIVE:   SUBJECTIVE STATEMENT: Got appt time mixed up last week so that's why she didn't show.  Knee is doing well. A little more painful today but not bad   PERTINENT HISTORY: Type 2 DM, HTN, HLD, hyperthyroid, pulmonary embolism, C-section, Bilateral TKA, COPD, neuropathy   PAIN:  Are you having pain? Yes: NPRS scale: 0-4/10 this week  Pain location: Rt knee Pain description: Throbbing like a tooth ache Aggravating factors: Prolonged postures and over weight-bearing Relieving factors: Tylenol  and ice (no prescription meds in over a week)  PRECAUTIONS: None  RED FLAGS: None   WEIGHT BEARING RESTRICTIONS: No  FALLS:  Has patient fallen in last 6 months? No  LIVING ENVIRONMENT: Lives with: lives with their family and lives with their spouse Lives in: House/apartment Stairs: Manages but not easy Has following equipment at home: Environmental Consultant - 2 wheeled  OCCUPATION: Retired  PLOF: Independent  PATIENT GOALS: Return to daily walks of 45-60 minutes and normal ADLs  OBJECTIVE:  Note: Objective measures were completed at Evaluation unless otherwise noted.  DIAGNOSTIC FINDINGS: See chart  PATIENT SURVEYS:  PSFS: THE PATIENT SPECIFIC FUNCTIONAL SCALE  Place score of 0-10 (0 = unable to perform activity and 10 = able to perform activity at the same level as before injury or problem)  Activity 02/04/2024 02/18/2024   Walking for exercise 0 4   2.  Cleaning 0 6   3.  Sleeping 1 5   4.      Total Score 0.33 5     Total Score = Sum of activity scores/number of activities  Minimally Detectable Change: 3 points (for single activity); 2 points (for average score)  [facility] (nd). The Patient Specific Functional Scale . Retrieved from [email or web address].[email or web address]   COGNITION: Overall cognitive  status: Within functional limits for tasks assessed     SENSATION: Cheryl has no complaints of new peripheral pain or paresthesias post surgery  EDEMA:  Noted and not objectively assessed   LOWER EXTREMITY ROM:  Active ROM Left/Right 02/04/2024 Right 02/11/24 Right 02/28/24  Knee flexion 121/119 123 125  Knee extension 0/-5 -2 0 (seated LAQ)   (Blank rows = not tested)  STRENGTH:  In pounds with hand-held dynamometer Left/Right Left/Right 02/18/2024  Hip flexion    Hip extension    Hip abduction    Hip adduction    Hip internal rotation    Hip external rotation    Knee flexion    Knee extension 57.4/28.3 53.4/30.6  Ankle dorsiflexion    Ankle plantarflexion    Ankle inversion    Ankle eversion     (Blank rows = not tested)  GAIT: Distance walked: 100 feet Assistive device utilized: Walker - 2 wheeled Level of assistance: Complete Independence Comments: Deal with walking frequently for exercise before her knee started limiting her.  She would like to return to this.  TREATMENT DATE: R TKA 02/28/24 TherAct Recumbent bike seat 7; L5 x 8 min Leg press bil 93# x10, then 87# 2x10; then single limb performed bil 37# 2x10 Sit to/from stand 2 x 10 reps without UE support  TherEx ROM measurements - see above for details Seated LAQ 5# 2x10; 5 sec hold  Modalities Vaso at 34 degrees mod compression x 10 min for edema; Rt knee     02/22/2024 Recumbent bike Seat 7 for 5 minutes, Resistance Level 5, :45 at an 8+ MPH pace and sprint the last :15 of each minute (13+ MPH) Seated straight leg raises 2 sets of 10 x each side with 1#  Functional Activities: Double Leg Press 87# full extension and slow eccentrics 15 reps Single leg Press 37# bilaterally, full extension and slow eccentrics 10 reps Sit to stand with slow eccentrics 2 sets of 5  reps  Neuromuscular re-education: Tandem balance: eyes open 2 x 20 seconds; head turning 4 x 20 seconds and eyes closed 4 x 20 seconds Single leg stance 10 x 10 seconds Slow marching 12 x 3 seconds  Vaso at 34 degrees High compression x 10 min for edema; Rt knee    02/18/2024 Recumbent bike Seat 7 for 5.5 minutes, Resistance Level 5 Seated straight leg raises 10 x each side   Functional Activities: Double Leg Press 75# full extension and slow eccentrics 15 reps Single leg Press 31# bilaterally, full extension and slow eccentrics 10 reps Step-down off 2 and 4 inch step, slow eccentrics 10 x each side  Neuromuscular re-education: Tandem balance: eyes open 6 x 20 seconds and head turning 4 x 20 seconds Slow marching 12 x 3 seconds  Vaso at 34 degrees medium compression x 10 min for edema; Rt knee   02/11/24 TherAct NuStep L4 x 8 min; LEs only Leg press bil 75# 3x10 RLE only 31# x 10, then 25# 2x10 Seated LAQ with 4# on Rt; 3 sec hold; 2x10 Sit to/from stand 2x10 from elevated mat table (20)  Vaso at 34 degrees medium compression x10 min for edema; Rt knee   PATIENT EDUCATION:  Education details: See above Person educated: Patient Education method: Explanation, Demonstration, Tactile cues, Verbal cues, and Handouts Education comprehension: verbalized understanding, returned demonstration, verbal cues required, tactile cues required, and needs further education  HOME EXERCISE PROGRAM: Access Code: [email or web address] URL: [email or web address] Date: 02/22/2024 Prepared by: Lamar Ivory  Exercises - Tandem Stance  - 1-2 x daily - 7 x weekly - 1 sets - 10 reps - 20 second hold - Seated Straight Leg Raise   - 1 x daily - 7 x weekly - 3-5 sets - 10 reps - 2 seconds hold - Single Leg Stance  - 1 x daily - 7 x weekly - 1-2 sets - 10 reps - 10 seconds hold  ASSESSMENT:  CLINICAL IMPRESSION: Pt with excellent ROM at this time, and overall doing excellent post op.  She  still has some trouble with stairs and eccentric knee control, so will continue to work on strengthening and balance but anticipate d/c next 1-2 weeks.   OBJECTIVE IMPAIRMENTS: Abnormal gait, cardiopulmonary status limiting activity, decreased activity tolerance, decreased balance, decreased endurance, decreased knowledge of condition, difficulty walking, decreased ROM, decreased strength, increased edema, impaired perceived functional ability, increased muscle spasms, and pain.   ACTIVITY LIMITATIONS: bending, sitting, standing, squatting, sleeping, stairs, and locomotion level  PARTICIPATION LIMITATIONS: meal prep, cleaning, laundry, driving, shopping, and community activity  PERSONAL FACTORS: Type 2 DM, HTN,  HLD, hyperthyroid, pulmonary embolism, C-section, Bilateral TKA, COPD, neuropathy are also affecting patient's functional outcome.   REHAB POTENTIAL: Good  CLINICAL DECISION MAKING: Evolving/moderate complexity  EVALUATION COMPLEXITY: Moderate   GOALS: Goals reviewed with patient? Yes  SHORT TERM GOALS: Target date: 02/11/2024 Mirella will be independent with her day 1 home exercises Baseline: Started 02/04/2024 Goal status: MET 02/07/24  2.  Improve right knee extension active range of motion to within 3 degrees of full extension Baseline: 5 degrees from full extension Goal status: MET 02/11/24  3.  Improve right quadriceps strength to at least 40 pounds Baseline: 28.3 pounds Goal status: Ongoing 02/18/24   LONG TERM GOALS: Target date: 04/28/2024  Improve patient-specific functional score to at least 5 Baseline: 0.33 Goal status: Met 02/18/2024  2.  Gracin will report right knee pain consistently 0-3/10 on the numeric pain rating scale Baseline: 7-10/10 Goal status: Partially Met 02/22/2024  3.  Improve right knee active range of motion to at least 0 - 2 - 120 degrees Baseline: 0 - 5 - 119 degrees Goal status: Met 02/18/2024  4.  Improve right quadriceps strength to  50+ pounds Baseline: 28.3 pounds Goal status: Ongoing 02/18/2024  5.  Mirta will be independent with her walking without an assistive device and will be able to walk at least 1/4 mile without increasing right knee symptoms Baseline: Wheeled walker Goal status: Ongoing 02/22/2024  6.  Hayden will be independent with her long-term maintenance home exercise program at discharge Baseline: Started 02/04/2024 Goal status: INITIAL   PLAN:  PT FREQUENCY: 2x/week  PT DURATION: 12 weeks  PLANNED INTERVENTIONS: 97750- Physical Performance Testing, 97110-Therapeutic exercises, 97530- Therapeutic activity, 97112- Neuromuscular re-education, 97535- Self Care, 02859- Manual therapy, [id number]- Gait training, [phone]- Vasopneumatic device, Patient/Family education, Balance training, Stair training, Joint mobilization, and Cryotherapy  PLAN FOR NEXT SESSION: stairs, balance, strengthening, what did MD say?  NEXT MD VISIT: 03/01/2024  Corean JULIANNA Ku, PT, DPT 02/28/24 11:08 AM    Date of referral: 02/02/2024 Referring provider: Cordella Glendia Hutchinson, MD Referring diagnosis?  Z96.651 (ICD-10-CM) - S/P total knee arthroplasty, right   Treatment diagnosis? (if different than referring diagnosis) R26.2   M62.81   R60.0   M25.561   M25.661  What was this (referring dx) caused by? Surgery (Type: TKA)  Nature of Condition: Initial Onset (within last 3 months)   Laterality: Rt  Current Functional Measure Score: Patient Specific Functional Scale 0.33  Objective measurements identify impairments when they are compared to normal values, the uninvolved extremity, and prior level of function.  [x]  Yes  []  No  Objective assessment of functional ability: Severe functional limitations   Briefly describe symptoms: Typical post-surgical knee replacement with pain affecting sleep and all functional activities  How did symptoms start: Osteoarthritis leading to a total knee replacement  Average pain  intensity:  Last 24 hours: As high as 10/10  Past week: 7-10/10  How often does the pt experience symptoms? Constantly  How much have the symptoms interfered with usual daily activities? Extremely  How has condition changed since care began at this facility? NA - initial visit  In general, how is the patients overall health? Good   BACK PAIN (STarT Back Screening Tool) No

## 2024-03-01 ENCOUNTER — Ambulatory Visit (INDEPENDENT_AMBULATORY_CARE_PROVIDER_SITE_OTHER): Admitting: Orthopedic Surgery

## 2024-03-01 ENCOUNTER — Encounter: Payer: Self-pay | Admitting: Rehabilitative and Restorative Service Providers"

## 2024-03-01 ENCOUNTER — Encounter: Payer: Self-pay | Admitting: Orthopedic Surgery

## 2024-03-01 ENCOUNTER — Ambulatory Visit: Admitting: Rehabilitative and Restorative Service Providers"

## 2024-03-01 DIAGNOSIS — R262 Difficulty in walking, not elsewhere classified: Secondary | ICD-10-CM | POA: Diagnosis not present

## 2024-03-01 DIAGNOSIS — M6281 Muscle weakness (generalized): Secondary | ICD-10-CM | POA: Diagnosis not present

## 2024-03-01 DIAGNOSIS — M25561 Pain in right knee: Secondary | ICD-10-CM

## 2024-03-01 DIAGNOSIS — M25661 Stiffness of right knee, not elsewhere classified: Secondary | ICD-10-CM

## 2024-03-01 DIAGNOSIS — Z96651 Presence of right artificial knee joint: Secondary | ICD-10-CM

## 2024-03-01 DIAGNOSIS — R600 Localized edema: Secondary | ICD-10-CM

## 2024-03-01 NOTE — Progress Notes (Signed)
 Post-Op Visit Note   Patient: Chelsea Gray           Date of Birth: 1955/07/22           MRN: [id number] Visit Date: 03/01/2024 PCP: Vernon Velna SAUNDERS, MD   Assessment & Plan:  Chief Complaint:  Chief Complaint  Patient presents with   Right Knee - Routine Post Op    S/p right TKA 01/18/2024   Visit Diagnoses: No diagnosis found.  Plan: Chelsea Gray is now 6 weeks out right total knee replacement.  Doing well.  Off pain medicine.  Taking extra strength Tylenol .  She is back to starting to do some walking which is about a half block or 1 block a day.  Doing strengthening and balance and physical therapy.  She is sleeping better.  On exam full extension to about 120 of flexion cold.  No effusion.  Plan at this time is that Chelsea Gray is done very well getting her range of motion back.  Agree with continuing with strengthening and balance 1-2 times a week for about another 3 to 4 weeks and then discharge to home exercise program.  We will see her back as needed.  Ultrasound was negative for DVT.  No calf tenderness and negative Homans today.  Follow-Up Instructions: No follow-ups on file.   Orders:  No orders of the defined types were placed in this encounter.  No orders of the defined types were placed in this encounter.   Imaging: No results found.  PMFS History: Patient Active Problem List   Diagnosis Date Noted   Arthritis of right knee 01/29/2024   S/P total knee replacement, right 01/18/2024   Hypokalemia 11/01/2023   High anion gap metabolic acidosis 11/01/2023   Pulmonary nodules 11/01/2023   History of COPD 11/01/2023   Hyperthyroidism 11/07/2019   Hyperlipidemia 11/07/2019   Chest pain 10/08/2019   Chest pain with moderate risk for cardiac etiology 10/07/2019   Abdominal pain 12/24/2010   MYALGIA 06/19/2009   NUMBNESS 06/19/2009   DM2 (diabetes mellitus, type 2) (HCC) 01/24/2009   HYPERCHOLESTEROLEMIA 01/24/2009   SMOKER 01/24/2009   DEPRESSION 01/24/2009   Essential  hypertension 01/24/2009   CHEST PAIN 01/24/2009   Neuropathy 01/24/2009   Iron deficiency anemia due to chronic blood loss 01/24/2009   Past Medical History:  Diagnosis Date   Anemia    tx w/iron   Blood transfusion    d/t hemmorrhage post c- section   DEPRESSION 01/24/2009   DM 01/24/2009   type 2   H/O blood transfusion reaction 09/1992   cause PE and hemolytic type reaction per pt   History of one miscarriage    HYPERCHOLESTEROLEMIA 01/24/2009   HYPERTENSION 01/24/2009   Hyperthyroidism    per pt on 01/13/24   Osteoarthritis    Pulmonary embolism (HCC)    result of blood transfusion reaction 18 years ago after childbirth    Family History  Problem Relation Age of Onset   Diabetes Mother    Hypertension Mother    Diabetes Father    Arrhythmia Father    Heart disease Father        CABG   Breast cancer Sister    Diabetes Sister    Anesthesia problems Neg Hx     Past Surgical History:  Procedure Laterality Date   BIOPSY  10/09/2019   Procedure: BIOPSY;  Surgeon: Saintclair Jasper, MD;  Location: [facility] ENDOSCOPY;  Service: Gastroenterology;;   BREAST CYST EXCISION     BREAST  REDUCTION SURGERY     CERVICAL CERCLAGE     CESAREAN SECTION     COLONOSCOPY WITH PROPOFOL  N/A 10/09/2019   Procedure: COLONOSCOPY WITH PROPOFOL ;  Surgeon: Saintclair Jasper, MD;  Location: [facility] ENDOSCOPY;  Service: Gastroenterology;  Laterality: N/A;   ESOPHAGOGASTRODUODENOSCOPY (EGD) WITH PROPOFOL  N/A 10/09/2019   Procedure: ESOPHAGOGASTRODUODENOSCOPY (EGD) WITH PROPOFOL ;  Surgeon: Saintclair Jasper, MD;  Location: [facility] ENDOSCOPY;  Service: Gastroenterology;  Laterality: N/A;   HEMOSTASIS CLIP PLACEMENT  10/09/2019   Procedure: HEMOSTASIS CLIP PLACEMENT;  Surgeon: Saintclair Jasper, MD;  Location: [facility] ENDOSCOPY;  Service: Gastroenterology;;   HOT HEMOSTASIS N/A 10/09/2019   Procedure: HOT HEMOSTASIS (ARGON PLASMA COAGULATION/BICAP);  Surgeon: Saintclair Jasper, MD;  Location: [facility] ENDOSCOPY;  Service: Gastroenterology;  Laterality:  N/A;   JOINT REPLACEMENT     KNEE ARTHROPLASTY  07/14/2011   Procedure: COMPUTER ASSISTED TOTAL LEFT  KNEE ARTHROPLASTY;  Surgeon: Cordella Glendia Hutchinson, MD;  Location: [facility] OR;  Service: Orthopedics;  Laterality: Left;  Left total knee arthroplasty   POLYPECTOMY  10/09/2019   Procedure: POLYPECTOMY;  Surgeon: Saintclair Jasper, MD;  Location: [facility] ENDOSCOPY;  Service: Gastroenterology;;   REDUCTION MAMMAPLASTY Bilateral    TOTAL KNEE ARTHROPLASTY Right 01/18/2024   Procedure: RIGHT TOTAL KNEE ARTHROPLASTY;  Surgeon: Hutchinson Cordella Glendia, MD;  Location: [facility] OR;  Service: Orthopedics;  Laterality: Right;   Social History   Occupational History   Occupation: Neurosurgeon: [employer]  Tobacco Use   Smoking status: Every Day    Current packs/day: 0.10    Average packs/day: 0.1 packs/day for 6.0 years (0.6 ttl pk-yrs)    Types: Cigarettes   Smokeless tobacco: Never  Vaping Use   Vaping status: Never Used  Substance and Sexual Activity   Alcohol use: Yes    Comment: socially beer-wine-liquor   Drug use: No   Sexual activity: Yes    Birth control/protection: Post-menopausal

## 2024-03-01 NOTE — Therapy (Signed)
 OUTPATIENT PHYSICAL THERAPY LOWER EXTREMITY TREATMENT   Patient Name: Chelsea Gray MRN: [id number] DOB:January 29, 1956, 68 y.o., female Today's Date: 03/01/2024  END OF SESSION:  PT End of Session - 03/01/24 0936     Visit Number 7    Number of Visits 24    Date for Recertification  04/28/24    Authorization Type [employer] Medicare    Progress Note Due on Visit 10    PT Start Time 0933    PT Stop Time 1013    PT Time Calculation (min) 40 min    Activity Tolerance Patient tolerated treatment well;No increased pain    Behavior During Therapy [facility] for tasks assessed/performed            Past Medical History:  Diagnosis Date   Anemia    tx w/iron   Blood transfusion    d/t hemmorrhage post c- section   DEPRESSION 01/24/2009   DM 01/24/2009   type 2   H/O blood transfusion reaction 09/1992   cause PE and hemolytic type reaction per pt   History of one miscarriage    HYPERCHOLESTEROLEMIA 01/24/2009   HYPERTENSION 01/24/2009   Hyperthyroidism    per pt on 01/13/24   Osteoarthritis    Pulmonary embolism (HCC)    result of blood transfusion reaction 18 years ago after childbirth   Past Surgical History:  Procedure Laterality Date   BIOPSY  10/09/2019   Procedure: BIOPSY;  Surgeon: Saintclair Jasper, MD;  Location: [facility] ENDOSCOPY;  Service: Gastroenterology;;   BREAST CYST EXCISION     BREAST REDUCTION SURGERY     CERVICAL CERCLAGE     CESAREAN SECTION     COLONOSCOPY WITH PROPOFOL  N/A 10/09/2019   Procedure: COLONOSCOPY WITH PROPOFOL ;  Surgeon: Saintclair Jasper, MD;  Location: [facility] ENDOSCOPY;  Service: Gastroenterology;  Laterality: N/A;   ESOPHAGOGASTRODUODENOSCOPY (EGD) WITH PROPOFOL  N/A 10/09/2019   Procedure: ESOPHAGOGASTRODUODENOSCOPY (EGD) WITH PROPOFOL ;  Surgeon: Saintclair Jasper, MD;  Location: [facility] ENDOSCOPY;  Service: Gastroenterology;  Laterality: N/A;   HEMOSTASIS CLIP PLACEMENT  10/09/2019   Procedure: HEMOSTASIS CLIP PLACEMENT;  Surgeon: Saintclair Jasper, MD;  Location: [facility] ENDOSCOPY;   Service: Gastroenterology;;   HOT HEMOSTASIS N/A 10/09/2019   Procedure: HOT HEMOSTASIS (ARGON PLASMA COAGULATION/BICAP);  Surgeon: Saintclair Jasper, MD;  Location: [facility] ENDOSCOPY;  Service: Gastroenterology;  Laterality: N/A;   JOINT REPLACEMENT     KNEE ARTHROPLASTY  07/14/2011   Procedure: COMPUTER ASSISTED TOTAL LEFT  KNEE ARTHROPLASTY;  Surgeon: Cordella Glendia Hutchinson, MD;  Location: [facility] OR;  Service: Orthopedics;  Laterality: Left;  Left total knee arthroplasty   POLYPECTOMY  10/09/2019   Procedure: POLYPECTOMY;  Surgeon: Saintclair Jasper, MD;  Location: [facility] ENDOSCOPY;  Service: Gastroenterology;;   REDUCTION MAMMAPLASTY Bilateral    TOTAL KNEE ARTHROPLASTY Right 01/18/2024   Procedure: RIGHT TOTAL KNEE ARTHROPLASTY;  Surgeon: Hutchinson Cordella Glendia, MD;  Location: [facility] OR;  Service: Orthopedics;  Laterality: Right;   Patient Active Problem List   Diagnosis Date Noted   Arthritis of right knee 01/29/2024   S/P total knee replacement, right 01/18/2024   Hypokalemia 11/01/2023   High anion gap metabolic acidosis 11/01/2023   Pulmonary nodules 11/01/2023   History of COPD 11/01/2023   Hyperthyroidism 11/07/2019   Hyperlipidemia 11/07/2019   Chest pain 10/08/2019   Chest pain with moderate risk for cardiac etiology 10/07/2019   Abdominal pain 12/24/2010   MYALGIA 06/19/2009   NUMBNESS 06/19/2009   DM2 (diabetes mellitus, type 2) (HCC) 01/24/2009   HYPERCHOLESTEROLEMIA 01/24/2009   SMOKER  01/24/2009   DEPRESSION 01/24/2009   Essential hypertension 01/24/2009   CHEST PAIN 01/24/2009   Neuropathy 01/24/2009   Iron deficiency anemia due to chronic blood loss 01/24/2009    PCP: Rinka R. Vernon, MD  REFERRING PROVIDER: Cordella Glendia Hutchinson, MD  REFERRING DIAG:  [phone] (ICD-10-CM) - S/P total knee arthroplasty, right    THERAPY DIAG:  Difficulty in walking, not elsewhere classified  Muscle weakness (generalized)  Localized edema  Acute pain of right knee  Stiffness of right knee, not  elsewhere classified  Rationale for Evaluation and Treatment: Rehabilitation  ONSET DATE: 01/18/2024  SUBJECTIVE:   SUBJECTIVE STATEMENT: Chelsea Gray reports she was released by Dr. Hutchinson although she wants to continue strength, balance and functional work in supervised PT before being discharged.  PERTINENT HISTORY: Type 2 DM, HTN, HLD, hyperthyroid, pulmonary embolism, C-section, Bilateral TKA, COPD, neuropathy   PAIN:  Are you having pain? Yes: NPRS scale: Still 0-4/10 this week  Pain location: Rt knee Pain description: Throbbing like a tooth ache Aggravating factors: Prolonged postures and over weight-bearing Relieving factors: Tylenol  1 x a day before bed and ice PRN (no prescription meds in 2 weeks)  PRECAUTIONS: None  RED FLAGS: None   WEIGHT BEARING RESTRICTIONS: No  FALLS:  Has patient fallen in last 6 months? No  LIVING ENVIRONMENT: Lives with: lives with their family and lives with their spouse Lives in: House/apartment Stairs: Manages but not easy Has following equipment at home: Environmental Consultant - 2 wheeled  OCCUPATION: Retired  PLOF: Independent  PATIENT GOALS: Return to daily walks of 45-60 minutes and normal ADLs  OBJECTIVE:  Note: Objective measures were completed at Evaluation unless otherwise noted.  DIAGNOSTIC FINDINGS: See chart  PATIENT SURVEYS:  PSFS: THE PATIENT SPECIFIC FUNCTIONAL SCALE  Place score of 0-10 (0 = unable to perform activity and 10 = able to perform activity at the same level as before injury or problem)  Activity 02/04/2024 02/18/2024   Walking for exercise 0 4   2.  Cleaning 0 6   3.  Sleeping 1 5   4.      Total Score 0.33 5     Total Score = Sum of activity scores/number of activities  Minimally Detectable Change: 3 points (for single activity); 2 points (for average score)  [facility] (nd). The Patient Specific Functional Scale . Retrieved from  [email or web address]   COGNITION: Overall cognitive status: Within functional limits for tasks assessed     SENSATION: Chelsea Gray has no complaints of new peripheral pain or paresthesias post surgery  EDEMA:  Noted and not objectively assessed   LOWER EXTREMITY ROM:  Active ROM Left/Right 02/04/2024 Right 02/11/24 Right 02/28/24  Knee flexion 121/119 123 125  Knee extension 0/-5 -2 0 (seated LAQ)   (Blank rows = not tested)  STRENGTH:  In pounds with hand-held dynamometer Left/Right Left/Right 02/18/2024  Hip flexion    Hip extension    Hip abduction    Hip adduction    Hip internal rotation    Hip external rotation    Knee flexion    Knee extension 57.4/28.3 53.4/30.6  Ankle dorsiflexion    Ankle plantarflexion    Ankle inversion    Ankle eversion     (Blank rows = not tested)  GAIT: Distance walked: 100 feet Assistive device utilized: Walker - 2 wheeled Level of assistance: Complete Independence Comments: Deal with walking frequently for exercise before her knee started limiting her.  She would like to return to this.  TREATMENT DATE: R TKA 03/01/2024 Seated straight leg raises 3 sets of 10 with 1.5#, cues to pause before lifting and slow eccentrics  Functional Activities: Step-down off 4 and 2 inch step bilateral slow eccentrics and 1 hand assist to feel in the quadriceps and hip abductors vs the knee Obstacle course stepping over hurdles (balance, high stepping) Double Leg Press 100# 10 x slow eccentrics Single Leg Press 43# 10 x each side  Neuromuscular re-education: Tandem balance 4 x 20 seconds Single leg stance 10 x 10 seconds Tandem walking on foam 10 laps   02/28/24 TherAct Recumbent bike seat 7; L5 x 8 min Leg press bil 93# x10, then 87# 2x10; then single limb performed bil  37# 2x10 Sit to/from stand 2 x 10 reps without UE support  TherEx ROM measurements - see above for details Seated LAQ 5# 2x10; 5 sec hold  Modalities Vaso at 34 degrees mod compression x 10 min for edema; Rt knee    02/22/2024 Recumbent bike Seat 7 for 5 minutes, Resistance Level 5, :45 at an 8+ MPH pace and sprint the last :15 of each minute (13+ MPH) Seated straight leg raises 2 sets of 10 x each side with 1#  Functional Activities: Double Leg Press 87# full extension and slow eccentrics 15 reps Single leg Press 37# bilaterally, full extension and slow eccentrics 10 reps Sit to stand with slow eccentrics 2 sets of 5 reps  Neuromuscular re-education: Tandem balance: eyes open 2 x 20 seconds; head turning 4 x 20 seconds and eyes closed 4 x 20 seconds Single leg stance 10 x 10 seconds Slow marching 12 x 3 seconds  Vaso at 34 degrees High compression x 10 min for edema; Rt knee    PATIENT EDUCATION:  Education details: See above Person educated: Patient Education method: Explanation, Demonstration, Tactile cues, Verbal cues, and Handouts Education comprehension: verbalized understanding, returned demonstration, verbal cues required, tactile cues required, and needs further education  HOME EXERCISE PROGRAM: Access Code: [email or web address] URL: [email or web address] Date: 03/01/2024 Prepared by: Lamar Ivory  Exercises - Tandem Stance  - 1 x daily - 7 x weekly - 1 sets - 10 reps - 20 second hold - Seated Straight Leg Raise   - 1 x daily - 7 x weekly - 3-5 sets - 10 reps - 2 seconds hold - Single Leg Stance  - 1 x daily - 7 x weekly - 1-2 sets - 10 reps - 10 seconds hold - Lateral Step Down  - 1 x daily - 3 x weekly - 2-3 sets - 10 reps  ASSESSMENT:  CLINICAL IMPRESSION: Chelsea Gray will continue to benefit from supervised balance, gait and functional work as she still relies a lot on her hands with loss of balance and she is apprehensive with stairs.  We worked mostly on  these impairments today and Chelsea Gray will benefit from continued strength, balance and functional work before transition into independent rehabilitation.  OBJECTIVE IMPAIRMENTS: Abnormal gait, cardiopulmonary status limiting activity, decreased activity tolerance, decreased balance, decreased endurance, decreased knowledge of condition, difficulty walking, decreased ROM, decreased strength, increased edema, impaired perceived functional ability, increased muscle spasms, and pain.   ACTIVITY LIMITATIONS: bending, sitting, standing, squatting, sleeping, stairs, and locomotion level  PARTICIPATION LIMITATIONS: meal prep, cleaning, laundry, driving, shopping, and community activity  PERSONAL FACTORS: Type 2 DM, HTN, HLD, hyperthyroid, pulmonary embolism, C-section, Bilateral TKA, COPD, neuropathy are also affecting patient's functional outcome.   REHAB POTENTIAL: Good  CLINICAL DECISION MAKING: Evolving/moderate complexity  EVALUATION  COMPLEXITY: Moderate   GOALS: Goals reviewed with patient? Yes  SHORT TERM GOALS: Target date: 02/11/2024 Chelsea Gray will be independent with her day 1 home exercises Baseline: Started 02/04/2024 Goal status: MET 02/07/24  2.  Improve right knee extension active range of motion to within 3 degrees of full extension Baseline: 5 degrees from full extension Goal status: MET 02/11/24  3.  Improve right quadriceps strength to at least 40 pounds Baseline: 28.3 pounds Goal status: Ongoing 02/18/24   LONG TERM GOALS: Target date: 04/28/2024  Improve patient-specific functional score to at least 5 Baseline: 0.33 Goal status: Met 02/18/2024  2.  Chelsea Gray will report right knee pain consistently 0-3/10 on the numeric pain rating scale Baseline: 7-10/10 Goal status: Partially Met 03/01/2024  3.  Improve right knee active range of motion to at least 0 - 2 - 120 degrees Baseline: 0 - 5 - 119 degrees Goal status: Met 02/18/2024  4.  Improve right quadriceps strength to 50+  pounds Baseline: 28.3 pounds Goal status: Ongoing 02/18/2024  5.  Chelsea Gray will be independent with her walking without an assistive device and will be able to walk at least 1/4 mile without increasing right knee symptoms Baseline: Wheeled walker Goal status: Ongoing 03/01/2024  6.  Chelsea Gray will be independent with her long-term maintenance home exercise program at discharge Baseline: Started 02/04/2024 Goal status: INITIAL   PLAN:  PT FREQUENCY: 2x/week  PT DURATION: 12 weeks  PLANNED INTERVENTIONS: 97750- Physical Performance Testing, 97110-Therapeutic exercises, 97530- Therapeutic activity, 97112- Neuromuscular re-education, 97535- Self Care, 02859- Manual therapy, [phone]- Gait training, [phone]- Vasopneumatic device, Patient/Family education, Balance training, Stair training, Joint mobilization, and Cryotherapy  PLAN FOR NEXT SESSION: Stairs, balance (step strategy), strengthening.  NEXT MD VISIT: Chelsea Gray Myer LELON Farrell PT, MPT 03/01/24 11:17 AM    Date of referral: 02/02/2024 Referring provider: Cordella Glendia Hutchinson, MD Referring diagnosis?  Z96.651 (ICD-10-CM) - S/P total knee arthroplasty, right   Treatment diagnosis? (if different than referring diagnosis) R26.2   M62.81   R60.0   M25.561   M25.661  What was this (referring dx) caused by? Surgery (Type: TKA)  Nature of Condition: Initial Onset (within last 3 months)   Laterality: Rt  Current Functional Measure Score: Patient Specific Functional Scale 0.33  Objective measurements identify impairments when they are compared to normal values, the uninvolved extremity, and prior level of function.  [x]  Yes  []  No  Objective assessment of functional ability: Severe functional limitations   Briefly describe symptoms: Typical post-surgical knee replacement with pain affecting sleep and all functional activities  How did symptoms start: Osteoarthritis leading to a total knee replacement  Average pain intensity:  Last 24 hours:  As high as 10/10  Past week: 7-10/10  How often does the pt experience symptoms? Constantly  How much have the symptoms interfered with usual daily activities? Extremely  How has condition changed since care began at this facility? NA - initial visit  In general, how is the patients overall health? Good   BACK PAIN (STarT Back Screening Tool) No

## 2024-03-07 ENCOUNTER — Encounter: Payer: Self-pay | Admitting: Physical Therapy

## 2024-03-07 ENCOUNTER — Ambulatory Visit: Admitting: Physical Therapy

## 2024-03-07 DIAGNOSIS — R262 Difficulty in walking, not elsewhere classified: Secondary | ICD-10-CM

## 2024-03-07 DIAGNOSIS — M25661 Stiffness of right knee, not elsewhere classified: Secondary | ICD-10-CM

## 2024-03-07 DIAGNOSIS — R600 Localized edema: Secondary | ICD-10-CM | POA: Diagnosis not present

## 2024-03-07 DIAGNOSIS — M25561 Pain in right knee: Secondary | ICD-10-CM | POA: Diagnosis not present

## 2024-03-07 DIAGNOSIS — M6281 Muscle weakness (generalized): Secondary | ICD-10-CM

## 2024-03-07 NOTE — Therapy (Signed)
 OUTPATIENT PHYSICAL THERAPY LOWER EXTREMITY TREATMENT   Patient Name: Chelsea Gray MRN: [id number] DOB:[birth year], 68 y.o., female Today's Date: 03/07/2024  END OF SESSION:  PT End of Session - 03/07/24 1017     Visit Number 8    Number of Visits 24    Date for Recertification  04/28/24    Authorization Type [employer] Medicare    Progress Note Due on Visit 10    PT Start Time 1015    PT Stop Time 1054    PT Time Calculation (min) 39 min    Activity Tolerance Patient tolerated treatment well;No increased pain    Behavior During Therapy [facility] for tasks assessed/performed             Past Medical History:  Diagnosis Date   Anemia    tx w/iron   Blood transfusion    d/t hemmorrhage post c- section   DEPRESSION 01/24/2009   DM 01/24/2009   type 2   H/O blood transfusion reaction 09/1992   cause PE and hemolytic type reaction per pt   History of one miscarriage    HYPERCHOLESTEROLEMIA 01/24/2009   HYPERTENSION 01/24/2009   Hyperthyroidism    per pt on 01/13/24   Osteoarthritis    Pulmonary embolism (HCC)    result of blood transfusion reaction 18 years ago after childbirth   Past Surgical History:  Procedure Laterality Date   BIOPSY  10/09/2019   Procedure: BIOPSY;  Surgeon: Saintclair Jasper, MD;  Location: [facility] ENDOSCOPY;  Service: Gastroenterology;;   BREAST CYST EXCISION     BREAST REDUCTION SURGERY     CERVICAL CERCLAGE     CESAREAN SECTION     COLONOSCOPY WITH PROPOFOL  N/A 10/09/2019   Procedure: COLONOSCOPY WITH PROPOFOL ;  Surgeon: Saintclair Jasper, MD;  Location: [facility] ENDOSCOPY;  Service: Gastroenterology;  Laterality: N/A;   ESOPHAGOGASTRODUODENOSCOPY (EGD) WITH PROPOFOL  N/A 10/09/2019   Procedure: ESOPHAGOGASTRODUODENOSCOPY (EGD) WITH PROPOFOL ;  Surgeon: Saintclair Jasper, MD;  Location: [facility] ENDOSCOPY;  Service: Gastroenterology;  Laterality: N/A;   HEMOSTASIS CLIP PLACEMENT  10/09/2019   Procedure: HEMOSTASIS CLIP PLACEMENT;  Surgeon: Saintclair Jasper, MD;  Location: [facility] ENDOSCOPY;   Service: Gastroenterology;;   HOT HEMOSTASIS N/A 10/09/2019   Procedure: HOT HEMOSTASIS (ARGON PLASMA COAGULATION/BICAP);  Surgeon: Saintclair Jasper, MD;  Location: [facility] ENDOSCOPY;  Service: Gastroenterology;  Laterality: N/A;   JOINT REPLACEMENT     KNEE ARTHROPLASTY  07/14/2011   Procedure: COMPUTER ASSISTED TOTAL LEFT  KNEE ARTHROPLASTY;  Surgeon: Cordella Glendia Hutchinson, MD;  Location: [facility] OR;  Service: Orthopedics;  Laterality: Left;  Left total knee arthroplasty   POLYPECTOMY  10/09/2019   Procedure: POLYPECTOMY;  Surgeon: Saintclair Jasper, MD;  Location: [facility] ENDOSCOPY;  Service: Gastroenterology;;   REDUCTION MAMMAPLASTY Bilateral    TOTAL KNEE ARTHROPLASTY Right 01/18/2024   Procedure: RIGHT TOTAL KNEE ARTHROPLASTY;  Surgeon: Hutchinson Cordella Glendia, MD;  Location: [facility] OR;  Service: Orthopedics;  Laterality: Right;   Patient Active Problem List   Diagnosis Date Noted   Arthritis of right knee 01/29/2024   S/P total knee replacement, right 01/18/2024   Hypokalemia 11/01/2023   High anion gap metabolic acidosis 11/01/2023   Pulmonary nodules 11/01/2023   History of COPD 11/01/2023   Hyperthyroidism 11/07/2019   Hyperlipidemia 11/07/2019   Chest pain 10/08/2019   Chest pain with moderate risk for cardiac etiology 10/07/2019   Abdominal pain 12/24/2010   MYALGIA 06/19/2009   NUMBNESS 06/19/2009   DM2 (diabetes mellitus, type 2) (HCC) 01/24/2009   HYPERCHOLESTEROLEMIA 01/24/2009  SMOKER 01/24/2009   DEPRESSION 01/24/2009   Essential hypertension 01/24/2009   CHEST PAIN 01/24/2009   Neuropathy 01/24/2009   Iron deficiency anemia due to chronic blood loss 01/24/2009    PCP: Rinka R. Vernon, MD  REFERRING PROVIDER: Cordella Glendia Hutchinson, MD  REFERRING DIAG:  [phone] (ICD-10-CM) - S/P total knee arthroplasty, right    THERAPY DIAG:  Difficulty in walking, not elsewhere classified  Muscle weakness (generalized)  Localized edema  Acute pain of right knee  Stiffness of right knee, not  elsewhere classified  Rationale for Evaluation and Treatment: Rehabilitation  ONSET DATE: 01/18/2024  SUBJECTIVE:   SUBJECTIVE STATEMENT: Doing well. I thin Friday is going to be my last day.   PERTINENT HISTORY: Type 2 DM, HTN, HLD, hyperthyroid, pulmonary embolism, C-section, Bilateral TKA, COPD, neuropathy   PAIN:  Are you having pain? Yes: NPRS scale: Still 0-4/10 this week  Pain location: Rt knee Pain description: Throbbing like a tooth ache Aggravating factors: Prolonged postures and over weight-bearing Relieving factors: Tylenol  1 x a day before bed and ice PRN (no prescription meds in 2 weeks)  PRECAUTIONS: None  RED FLAGS: None   WEIGHT BEARING RESTRICTIONS: No  FALLS:  Has patient fallen in last 6 months? No  LIVING ENVIRONMENT: Lives with: lives with their family and lives with their spouse Lives in: House/apartment Stairs: Manages but not easy Has following equipment at home: Environmental Consultant - 2 wheeled  OCCUPATION: Retired  PLOF: Independent  PATIENT GOALS: Return to daily walks of 45-60 minutes and normal ADLs  OBJECTIVE:  Note: Objective measures were completed at Evaluation unless otherwise noted.  DIAGNOSTIC FINDINGS: See chart  PATIENT SURVEYS:  PSFS: THE PATIENT SPECIFIC FUNCTIONAL SCALE  Place score of 0-10 (0 = unable to perform activity and 10 = able to perform activity at the same level as before injury or problem)  Activity 02/04/2024 02/18/2024   Walking for exercise 0 4   2.  Cleaning 0 6   3.  Sleeping 1 5   Total Score 0.33 5     Total Score = Sum of activity scores/number of activities  Minimally Detectable Change: 3 points (for single activity); 2 points (for average score)  [employer] [employer] (nd). The Patient Specific Functional Scale . Retrieved from [email or web address]   COGNITION: Overall cognitive status: Within functional limits for tasks  assessed     SENSATION: Cheryl has no complaints of new peripheral pain or paresthesias post surgery  EDEMA:  Noted and not objectively assessed   LOWER EXTREMITY ROM:  Active ROM Left/Right 02/04/2024 Right 02/11/24 Right 02/28/24  Knee flexion 121/119 123 125  Knee extension 0/-5 -2 0 (seated LAQ)   (Blank rows = not tested)  STRENGTH:  In pounds with hand-held dynamometer Left/Right Left/Right 02/18/2024  Hip flexion    Hip extension    Hip abduction    Hip adduction    Hip internal rotation    Hip external rotation    Knee flexion    Knee extension 57.4/28.3 53.4/30.6  Ankle dorsiflexion    Ankle plantarflexion    Ankle inversion    Ankle eversion     (Blank rows = not tested)  GAIT: Distance walked: 100 feet Assistive device utilized: Walker - 2 wheeled Level of assistance: Complete Independence Comments: Deal with walking frequently for exercise before her knee started limiting her.  She would like to return to this.  TREATMENT DATE: R TKA 03/07/24 TherAct Recumbent bike seat 6 x 8 min; L5 Squats 2x10 Deadlifts 12# KB 2x10 Seated LAQ/hamstring curl with L5 theraband 2x10 bil; 3 sec hold Standing hip abduction 2x10; L5 band bil Standing hip extension 2x10; L5 band bil  TherEx Supine hamstring stretch with strap 3x30 sec bil Prone quad stretch with strap 3x30 sec bil   03/01/2024 Seated straight leg raises 3 sets of 10 with 1.5#, cues to pause before lifting and slow eccentrics  Functional Activities: Step-down off 4 and 2 inch step bilateral slow eccentrics and 1 hand assist to feel in the quadriceps and hip abductors vs the knee Obstacle course stepping over hurdles (balance, high stepping) Double Leg Press 100# 10 x slow eccentrics Single Leg Press 43# 10 x each side  Neuromuscular re-education: Tandem balance 4 x 20  seconds Single leg stance 10 x 10 seconds Tandem walking on foam 10 laps   02/28/24 TherAct Recumbent bike seat 7; L5 x 8 min Leg press bil 93# x10, then 87# 2x10; then single limb performed bil 37# 2x10 Sit to/from stand 2 x 10 reps without UE support  TherEx ROM measurements - see above for details Seated LAQ 5# 2x10; 5 sec hold  Modalities Vaso at 34 degrees mod compression x 10 min for edema; Rt knee    02/22/2024 Recumbent bike Seat 7 for 5 minutes, Resistance Level 5, :45 at an 8+ MPH pace and sprint the last :15 of each minute (13+ MPH) Seated straight leg raises 2 sets of 10 x each side with 1#  Functional Activities: Double Leg Press 87# full extension and slow eccentrics 15 reps Single leg Press 37# bilaterally, full extension and slow eccentrics 10 reps Sit to stand with slow eccentrics 2 sets of 5 reps  Neuromuscular re-education: Tandem balance: eyes open 2 x 20 seconds; head turning 4 x 20 seconds and eyes closed 4 x 20 seconds Single leg stance 10 x 10 seconds Slow marching 12 x 3 seconds  Vaso at 34 degrees High compression x 10 min for edema; Rt knee    PATIENT EDUCATION:  Education details: See above Person educated: Patient Education method: Explanation, Demonstration, Tactile cues, Verbal cues, and Handouts Education comprehension: verbalized understanding, returned demonstration, verbal cues required, tactile cues required, and needs further education  HOME EXERCISE PROGRAM: Access Code: [email or web address] URL: [email or web address] Date: 03/07/2024 Prepared by: Corean Ku  Exercises - Tandem Stance  - 1 x daily - 7 x weekly - 1 sets - 10 reps - 20 second hold - Seated Straight Leg Raise   - 1 x daily - 7 x weekly - 3-5 sets - 10 reps - 2 seconds hold - Single Leg Stance  - 1 x daily - 7 x weekly - 1-2 sets - 10 reps - 10 seconds hold - Lateral Step Down  - 1 x daily - 3 x weekly - 2-3 sets - 10 reps - Mini Squat with Counter  Support  - 1 x daily - 7 x weekly - 2-3 sets - 10 reps - 3-5 sec hold - Half Deadlift with Kettlebell  - 1 x daily - 7 x weekly - 2-3 sets - 10 reps - Seated Knee Extension with Resistance  - 1 x daily - 7 x weekly - 2-3 sets - 10 reps - 3-5 sec hold - Standing Hip Abduction with Resistance at Ankles and Counter Support  - 1 x daily - 7 x weekly - 2-3 sets - 10 reps -  Standing Hip Extension with Resistance at Ankles and Unilateral Counter Support  - 1 x daily - 7 x weekly - 2-3 sets - 10 reps  ASSESSMENT:  CLINICAL IMPRESSION: Pt doing very well with PT, did add additional strengthening exercises to HEP for some variety.  At this time pt feels she will be ready to transition to HEP only next visit.   OBJECTIVE IMPAIRMENTS: Abnormal gait, cardiopulmonary status limiting activity, decreased activity tolerance, decreased balance, decreased endurance, decreased knowledge of condition, difficulty walking, decreased ROM, decreased strength, increased edema, impaired perceived functional ability, increased muscle spasms, and pain.   ACTIVITY LIMITATIONS: bending, sitting, standing, squatting, sleeping, stairs, and locomotion level  PARTICIPATION LIMITATIONS: meal prep, cleaning, laundry, driving, shopping, and community activity  PERSONAL FACTORS: Type 2 DM, HTN, HLD, hyperthyroid, pulmonary embolism, C-section, Bilateral TKA, COPD, neuropathy are also affecting patient's functional outcome.   REHAB POTENTIAL: Good  CLINICAL DECISION MAKING: Evolving/moderate complexity  EVALUATION COMPLEXITY: Moderate   GOALS: Goals reviewed with patient? Yes  SHORT TERM GOALS: Target date: 02/11/2024 Neka will be independent with her day 1 home exercises Baseline: Started 02/04/2024 Goal status: MET 02/07/24  2.  Improve right knee extension active range of motion to within 3 degrees of full extension Baseline: 5 degrees from full extension Goal status: MET 02/11/24  3.  Improve right quadriceps  strength to at least 40 pounds Baseline: 28.3 pounds Goal status: Ongoing 02/18/24   LONG TERM GOALS: Target date: 04/28/2024  Improve patient-specific functional score to at least 5 Baseline: 0.33 Goal status: Met 02/18/2024  2.  Jonah will report right knee pain consistently 0-3/10 on the numeric pain rating scale Baseline: 7-10/10 Goal status: Partially Met 03/01/2024  3.  Improve right knee active range of motion to at least 0 - 2 - 120 degrees Baseline: 0 - 5 - 119 degrees Goal status: Met 02/18/2024  4.  Improve right quadriceps strength to 50+ pounds Baseline: 28.3 pounds Goal status: Ongoing 02/18/2024  5.  Mayson will be independent with her walking without an assistive device and will be able to walk at least 1/4 mile without increasing right knee symptoms Baseline: Wheeled walker Goal status: Ongoing 03/01/2024  6.  Glanda will be independent with her long-term maintenance home exercise program at discharge Baseline: Started 02/04/2024 Goal status: INITIAL   PLAN:  PT FREQUENCY: 2x/week  PT DURATION: 12 weeks  PLANNED INTERVENTIONS: 97750- Physical Performance Testing, 97110-Therapeutic exercises, 97530- Therapeutic activity, 97112- Neuromuscular re-education, 97535- Self Care, 02859- Manual therapy, [phone]- Gait training, [phone]- Vasopneumatic device, Patient/Family education, Balance training, Stair training, Joint mobilization, and Cryotherapy  PLAN FOR NEXT SESSION: check goals, likely d/c PT   NEXT MD VISIT: NA  Corean JULIANNA Ku, PT, DPT 03/07/24 10:55 AM     Date of referral: 02/02/2024 Referring provider: Cordella Glendia Hutchinson, MD Referring diagnosis?  Z96.651 (ICD-10-CM) - S/P total knee arthroplasty, right   Treatment diagnosis? (if different than referring diagnosis) R26.2   M62.81   R60.0   M25.561   M25.661  What was this (referring dx) caused by? Surgery (Type: TKA)  Nature of Condition: Initial Onset (within last 3 months)   Laterality:  Rt  Current Functional Measure Score: Patient Specific Functional Scale 0.33  Objective measurements identify impairments when they are compared to normal values, the uninvolved extremity, and prior level of function.  [x]  Yes  []  No  Objective assessment of functional ability: Severe functional limitations   Briefly describe symptoms: Typical post-surgical knee replacement with pain affecting  sleep and all functional activities  How did symptoms start: Osteoarthritis leading to a total knee replacement  Average pain intensity:  Last 24 hours: As high as 10/10  Past week: 7-10/10  How often does the pt experience symptoms? Constantly  How much have the symptoms interfered with usual daily activities? Extremely  How has condition changed since care began at this facility? NA - initial visit  In general, how is the patients overall health? Good   BACK PAIN (STarT Back Screening Tool) No

## 2024-03-10 ENCOUNTER — Encounter: Admitting: Rehabilitative and Restorative Service Providers"

## 2024-03-14 NOTE — Therapy (Signed)
 OUTPATIENT PHYSICAL THERAPY LOWER EXTREMITY TREATMENT   Patient Name: Chelsea Gray MRN: [id number] DOB:[birth year], 68 y.o., female Today's Date: 03/14/2024  END OF SESSION:       Past Medical History:  Diagnosis Date   Anemia    tx w/iron   Blood transfusion    d/t hemmorrhage post c- section   DEPRESSION 01/24/2009   DM 01/24/2009   type 2   H/O blood transfusion reaction 09/1992   cause PE and hemolytic type reaction per pt   History of one miscarriage    HYPERCHOLESTEROLEMIA 01/24/2009   HYPERTENSION 01/24/2009   Hyperthyroidism    per pt on 01/13/24   Osteoarthritis    Pulmonary embolism (HCC)    result of blood transfusion reaction 18 years ago after childbirth   Past Surgical History:  Procedure Laterality Date   BIOPSY  10/09/2019   Procedure: BIOPSY;  Surgeon: Saintclair Jasper, MD;  Location: [facility] ENDOSCOPY;  Service: Gastroenterology;;   BREAST CYST EXCISION     BREAST REDUCTION SURGERY     CERVICAL CERCLAGE     CESAREAN SECTION     COLONOSCOPY WITH PROPOFOL  N/A 10/09/2019   Procedure: COLONOSCOPY WITH PROPOFOL ;  Surgeon: Saintclair Jasper, MD;  Location: [facility] ENDOSCOPY;  Service: Gastroenterology;  Laterality: N/A;   ESOPHAGOGASTRODUODENOSCOPY (EGD) WITH PROPOFOL  N/A 10/09/2019   Procedure: ESOPHAGOGASTRODUODENOSCOPY (EGD) WITH PROPOFOL ;  Surgeon: Saintclair Jasper, MD;  Location: [facility] ENDOSCOPY;  Service: Gastroenterology;  Laterality: N/A;   HEMOSTASIS CLIP PLACEMENT  10/09/2019   Procedure: HEMOSTASIS CLIP PLACEMENT;  Surgeon: Saintclair Jasper, MD;  Location: [facility] ENDOSCOPY;  Service: Gastroenterology;;   HOT HEMOSTASIS N/A 10/09/2019   Procedure: HOT HEMOSTASIS (ARGON PLASMA COAGULATION/BICAP);  Surgeon: Saintclair Jasper, MD;  Location: [facility] ENDOSCOPY;  Service: Gastroenterology;  Laterality: N/A;   JOINT REPLACEMENT     KNEE ARTHROPLASTY  07/14/2011   Procedure: COMPUTER ASSISTED TOTAL LEFT  KNEE ARTHROPLASTY;  Surgeon: Cordella Glendia Hutchinson, MD;  Location: [facility] OR;  Service: Orthopedics;   Laterality: Left;  Left total knee arthroplasty   POLYPECTOMY  10/09/2019   Procedure: POLYPECTOMY;  Surgeon: Saintclair Jasper, MD;  Location: [facility] ENDOSCOPY;  Service: Gastroenterology;;   REDUCTION MAMMAPLASTY Bilateral    TOTAL KNEE ARTHROPLASTY Right 01/18/2024   Procedure: RIGHT TOTAL KNEE ARTHROPLASTY;  Surgeon: Hutchinson Cordella Glendia, MD;  Location: [facility] OR;  Service: Orthopedics;  Laterality: Right;   Patient Active Problem List   Diagnosis Date Noted   Arthritis of right knee 01/29/2024   S/P total knee replacement, right 01/18/2024   Hypokalemia 11/01/2023   High anion gap metabolic acidosis 11/01/2023   Pulmonary nodules 11/01/2023   History of COPD 11/01/2023   Hyperthyroidism 11/07/2019   Hyperlipidemia 11/07/2019   Chest pain 10/08/2019   Chest pain with moderate risk for cardiac etiology 10/07/2019   Abdominal pain 12/24/2010   MYALGIA 06/19/2009   NUMBNESS 06/19/2009   DM2 (diabetes mellitus, type 2) (HCC) 01/24/2009   HYPERCHOLESTEROLEMIA 01/24/2009   SMOKER 01/24/2009   DEPRESSION 01/24/2009   Essential hypertension 01/24/2009   CHEST PAIN 01/24/2009   Neuropathy 01/24/2009   Iron deficiency anemia due to chronic blood loss 01/24/2009    PCP: Rinka R. Vernon, MD  REFERRING PROVIDER: Cordella Glendia Hutchinson, MD  REFERRING DIAG:  [phone] (ICD-10-CM) - S/P total knee arthroplasty, right    THERAPY DIAG:  No diagnosis found.  Rationale for Evaluation and Treatment: Rehabilitation  ONSET DATE: 01/18/2024  SUBJECTIVE:   SUBJECTIVE STATEMENT: *** Doing well. I thin Friday is going to be my last  day.   PERTINENT HISTORY: Type 2 DM, HTN, HLD, hyperthyroid, pulmonary embolism, C-section, Bilateral TKA, COPD, neuropathy   PAIN:  Are you having pain? Yes: NPRS scale: Still 0-4/10 this week  Pain location: Rt knee Pain description: Throbbing like a tooth ache Aggravating factors: Prolonged postures and over weight-bearing Relieving factors: Tylenol  1 x a day  before bed and ice PRN (no prescription meds in 2 weeks)  PRECAUTIONS: None  RED FLAGS: None   WEIGHT BEARING RESTRICTIONS: No  FALLS:  Has patient fallen in last 6 months? No  LIVING ENVIRONMENT: Lives with: lives with their family and lives with their spouse Lives in: House/apartment Stairs: Manages but not easy Has following equipment at home: Environmental Consultant - 2 wheeled  OCCUPATION: Retired  PLOF: Independent  PATIENT GOALS: Return to daily walks of 45-60 minutes and normal ADLs  OBJECTIVE:  Note: Objective measures were completed at Evaluation unless otherwise noted.  DIAGNOSTIC FINDINGS: See chart  PATIENT SURVEYS:  PSFS: THE PATIENT SPECIFIC FUNCTIONAL SCALE  Place score of 0-10 (0 = unable to perform activity and 10 = able to perform activity at the same level as before injury or problem)  Activity 02/04/2024 02/18/2024   Walking for exercise 0 4   2.  Cleaning 0 6   3.  Sleeping 1 5   Total Score 0.33 5     Total Score = Sum of activity scores/number of activities  Minimally Detectable Change: 3 points (for single activity); 2 points (for average score)  [facility] (nd). The Patient Specific Functional Scale . Retrieved from [email or web address]   COGNITION: Overall cognitive status: Within functional limits for tasks assessed     SENSATION: Chelsea Gray has no complaints of new peripheral pain or paresthesias post surgery  EDEMA:  Noted and not objectively assessed   LOWER EXTREMITY ROM:  Active ROM Left/Right 02/04/2024 Right 02/11/24 Right 02/28/24  Knee flexion 121/119 123 125  Knee extension 0/-5 -2 0 (seated LAQ)   (Blank rows = not tested)  STRENGTH:  In pounds with hand-held dynamometer Left/Right Left/Right 02/18/2024  Hip flexion    Hip extension    Hip abduction    Hip adduction    Hip internal rotation    Hip external rotation    Knee flexion    Knee extension 57.4/28.3  53.4/30.6  Ankle dorsiflexion    Ankle plantarflexion    Ankle inversion    Ankle eversion     (Blank rows = not tested)  GAIT: Distance walked: 100 feet Assistive device utilized: Walker - 2 wheeled Level of assistance: Complete Independence Comments: Deal with walking frequently for exercise before her knee started limiting her.  She would like to return to this.                                                                                                                                TREATMENT DATE: R TKA 03/15/2024 ***  03/07/24  TherAct Recumbent bike seat 6 x 8 min; L5 Squats 2x10 Deadlifts 12# KB 2x10 Seated LAQ/hamstring curl with L5 theraband 2x10 bil; 3 sec hold Standing hip abduction 2x10; L5 band bil Standing hip extension 2x10; L5 band bil  TherEx Supine hamstring stretch with strap 3x30 sec bil Prone quad stretch with strap 3x30 sec bil   03/01/2024 Seated straight leg raises 3 sets of 10 with 1.5#, cues to pause before lifting and slow eccentrics  Functional Activities: Step-down off 4 and 2 inch step bilateral slow eccentrics and 1 hand assist to feel in the quadriceps and hip abductors vs the knee Obstacle course stepping over hurdles (balance, high stepping) Double Leg Press 100# 10 x slow eccentrics Single Leg Press 43# 10 x each side  Neuromuscular re-education: Tandem balance 4 x 20 seconds Single leg stance 10 x 10 seconds Tandem walking on foam 10 laps   02/28/24 TherAct Recumbent bike seat 7; L5 x 8 min Leg press bil 93# x10, then 87# 2x10; then single limb performed bil 37# 2x10 Sit to/from stand 2 x 10 reps without UE support  TherEx ROM measurements - see above for details Seated LAQ 5# 2x10; 5 sec hold  Modalities Vaso at 34 degrees mod compression x 10 min for edema; Rt knee    02/22/2024 Recumbent bike Seat 7 for 5 minutes, Resistance Level 5, :45 at an 8+ MPH pace and sprint the last :15 of each minute (13+ MPH) Seated  straight leg raises 2 sets of 10 x each side with 1#  Functional Activities: Double Leg Press 87# full extension and slow eccentrics 15 reps Single leg Press 37# bilaterally, full extension and slow eccentrics 10 reps Sit to stand with slow eccentrics 2 sets of 5 reps  Neuromuscular re-education: Tandem balance: eyes open 2 x 20 seconds; head turning 4 x 20 seconds and eyes closed 4 x 20 seconds Single leg stance 10 x 10 seconds Slow marching 12 x 3 seconds  Vaso at 34 degrees High compression x 10 min for edema; Rt knee    PATIENT EDUCATION:  Education details: See above Person educated: Patient Education method: Explanation, Demonstration, Tactile cues, Verbal cues, and Handouts Education comprehension: verbalized understanding, returned demonstration, verbal cues required, tactile cues required, and needs further education  HOME EXERCISE PROGRAM: Access Code: [email or web address] URL: [email or web address] Date: 03/07/2024 Prepared by: Corean Ku  Exercises - Tandem Stance  - 1 x daily - 7 x weekly - 1 sets - 10 reps - 20 second hold - Seated Straight Leg Raise   - 1 x daily - 7 x weekly - 3-5 sets - 10 reps - 2 seconds hold - Single Leg Stance  - 1 x daily - 7 x weekly - 1-2 sets - 10 reps - 10 seconds hold - Lateral Step Down  - 1 x daily - 3 x weekly - 2-3 sets - 10 reps - Mini Squat with Counter Support  - 1 x daily - 7 x weekly - 2-3 sets - 10 reps - 3-5 sec hold - Half Deadlift with Kettlebell  - 1 x daily - 7 x weekly - 2-3 sets - 10 reps - Seated Knee Extension with Resistance  - 1 x daily - 7 x weekly - 2-3 sets - 10 reps - 3-5 sec hold - Standing Hip Abduction with Resistance at Ankles and Counter Support  - 1 x daily - 7 x weekly - 2-3 sets - 10 reps - Standing Hip Extension with Resistance  at Ankles and Unilateral Counter Support  - 1 x daily - 7 x weekly - 2-3 sets - 10 reps  ASSESSMENT:  CLINICAL IMPRESSION: *** Pt doing very well with PT, did add  additional strengthening exercises to HEP for some variety.  At this time pt feels she will be ready to transition to HEP only next visit.   OBJECTIVE IMPAIRMENTS: Abnormal gait, cardiopulmonary status limiting activity, decreased activity tolerance, decreased balance, decreased endurance, decreased knowledge of condition, difficulty walking, decreased ROM, decreased strength, increased edema, impaired perceived functional ability, increased muscle spasms, and pain.   ACTIVITY LIMITATIONS: bending, sitting, standing, squatting, sleeping, stairs, and locomotion level  PARTICIPATION LIMITATIONS: meal prep, cleaning, laundry, driving, shopping, and community activity  PERSONAL FACTORS: Type 2 DM, HTN, HLD, hyperthyroid, pulmonary embolism, C-section, Bilateral TKA, COPD, neuropathy are also affecting patient's functional outcome.   REHAB POTENTIAL: Good  CLINICAL DECISION MAKING: Evolving/moderate complexity  EVALUATION COMPLEXITY: Moderate   GOALS: Goals reviewed with patient? Yes  SHORT TERM GOALS: Target date: 02/11/2024 Chelsea Gray will be independent with her day 1 home exercises Baseline: Started 02/04/2024 Goal status: MET 02/07/24  2.  Improve right knee extension active range of motion to within 3 degrees of full extension Baseline: 5 degrees from full extension Goal status: MET 02/11/24  3.  Improve right quadriceps strength to at least 40 pounds Baseline: 28.3 pounds Goal status: Ongoing 02/18/24   LONG TERM GOALS: Target date: 04/28/2024  Improve patient-specific functional score to at least 5 Baseline: 0.33 Goal status: Met 02/18/2024  2.  Chelsea Gray will report right knee pain consistently 0-3/10 on the numeric pain rating scale Baseline: 7-10/10 Goal status: Partially Met 03/01/2024  3.  Improve right knee active range of motion to at least 0 - 2 - 120 degrees Baseline: 0 - 5 - 119 degrees Goal status: Met 02/18/2024  4.  Improve right quadriceps strength to 50+  pounds Baseline: 28.3 pounds Goal status: Ongoing 02/18/2024  5.  Chelsea Gray will be independent with her walking without an assistive device and will be able to walk at least 1/4 mile without increasing right knee symptoms Baseline: Wheeled walker Goal status: Ongoing 03/01/2024  6.  Chelsea Gray will be independent with her long-term maintenance home exercise program at discharge Baseline: Started 02/04/2024 Goal status: INITIAL   PLAN:  PT FREQUENCY: 2x/week  PT DURATION: 12 weeks  PLANNED INTERVENTIONS: 97750- Physical Performance Testing, 97110-Therapeutic exercises, 97530- Therapeutic activity, 97112- Neuromuscular re-education, 97535- Self Care, 02859- Manual therapy, [phone]- Gait training, [phone]- Vasopneumatic device, Patient/Family education, Balance training, Stair training, Joint mobilization, and Cryotherapy  PLAN FOR NEXT SESSION: check goals, likely d/c PT ***   NEXT MD VISIT: NA  Susannah Daring, PT, DPT 03/14/24 9:38 AM       Date of referral: 02/02/2024 Referring provider: Cordella Glendia Hutchinson, MD Referring diagnosis?  Z96.651 (ICD-10-CM) - S/P total knee arthroplasty, right   Treatment diagnosis? (if different than referring diagnosis) R26.2   M62.81   R60.0   M25.561   M25.661  What was this (referring dx) caused by? Surgery (Type: TKA)  Nature of Condition: Initial Onset (within last 3 months)   Laterality: Rt  Current Functional Measure Score: Patient Specific Functional Scale 0.33  Objective measurements identify impairments when they are compared to normal values, the uninvolved extremity, and prior level of function.  [x]  Yes  []  No  Objective assessment of functional ability: Severe functional limitations   Briefly describe symptoms: Typical post-surgical knee replacement with pain affecting sleep and  all functional activities  How did symptoms start: Osteoarthritis leading to a total knee replacement  Average pain intensity:  Last 24 hours: As high as  10/10  Past week: 7-10/10  How often does the pt experience symptoms? Constantly  How much have the symptoms interfered with usual daily activities? Extremely  How has condition changed since care began at this facility? NA - initial visit  In general, how is the patients overall health? Good   BACK PAIN (STarT Back Screening Tool) No

## 2024-03-15 ENCOUNTER — Ambulatory Visit

## 2024-03-15 DIAGNOSIS — M6281 Muscle weakness (generalized): Secondary | ICD-10-CM | POA: Diagnosis not present

## 2024-03-15 DIAGNOSIS — R600 Localized edema: Secondary | ICD-10-CM | POA: Diagnosis not present

## 2024-03-15 DIAGNOSIS — M25661 Stiffness of right knee, not elsewhere classified: Secondary | ICD-10-CM | POA: Diagnosis not present

## 2024-03-15 DIAGNOSIS — M25561 Pain in right knee: Secondary | ICD-10-CM

## 2024-03-15 DIAGNOSIS — R262 Difficulty in walking, not elsewhere classified: Secondary | ICD-10-CM

## 2024-03-17 ENCOUNTER — Encounter: Admitting: Rehabilitative and Restorative Service Providers"

## 2024-03-22 ENCOUNTER — Encounter

## 2024-03-24 ENCOUNTER — Encounter: Admitting: Rehabilitative and Restorative Service Providers"
